# Patient Record
Sex: Female | Born: 1980 | Race: Black or African American | Hispanic: No
Health system: Southern US, Community
[De-identification: ages and names within clinical notes are randomized; demographics above are authoritative.]

## PROBLEM LIST (undated history)

## (undated) ENCOUNTER — Telehealth: Attending: Clinical | Primary: Clinical

## (undated) ENCOUNTER — Telehealth

## (undated) ENCOUNTER — Encounter

## (undated) ENCOUNTER — Ambulatory Visit: Payer: PRIVATE HEALTH INSURANCE | Attending: Family | Primary: Family

## (undated) ENCOUNTER — Encounter: Attending: Internal Medicine | Primary: Internal Medicine

## (undated) ENCOUNTER — Ambulatory Visit

## (undated) ENCOUNTER — Ambulatory Visit: Payer: Medicaid (Managed Care)

## (undated) ENCOUNTER — Encounter
Attending: Student in an Organized Health Care Education/Training Program | Primary: Student in an Organized Health Care Education/Training Program

## (undated) ENCOUNTER — Encounter: Attending: Clinical | Primary: Clinical

## (undated) ENCOUNTER — Ambulatory Visit: Payer: PRIVATE HEALTH INSURANCE | Attending: Adult Health | Primary: Adult Health

## (undated) ENCOUNTER — Telehealth: Attending: Family | Primary: Family

## (undated) ENCOUNTER — Non-Acute Institutional Stay
Payer: PRIVATE HEALTH INSURANCE | Attending: Rehabilitative and Restorative Service Providers" | Primary: Rehabilitative and Restorative Service Providers"

## (undated) ENCOUNTER — Ambulatory Visit: Payer: PRIVATE HEALTH INSURANCE | Attending: Nutritionist | Primary: Nutritionist

## (undated) ENCOUNTER — Encounter: Attending: Orthopaedic Surgery | Primary: Orthopaedic Surgery

## (undated) ENCOUNTER — Encounter: Attending: Physician Assistant | Primary: Physician Assistant

## (undated) ENCOUNTER — Encounter
Payer: PRIVATE HEALTH INSURANCE | Attending: Student in an Organized Health Care Education/Training Program | Primary: Student in an Organized Health Care Education/Training Program

## (undated) ENCOUNTER — Encounter: Attending: Family Medicine | Primary: Family Medicine

## (undated) ENCOUNTER — Telehealth: Attending: Family Medicine | Primary: Family Medicine

## (undated) ENCOUNTER — Ambulatory Visit: Payer: PRIVATE HEALTH INSURANCE | Attending: Surgery | Primary: Surgery

## (undated) ENCOUNTER — Telehealth: Attending: Registered" | Primary: Registered"

## (undated) ENCOUNTER — Ambulatory Visit: Payer: PRIVATE HEALTH INSURANCE | Attending: Family Medicine | Primary: Family Medicine

## (undated) ENCOUNTER — Encounter
Payer: PRIVATE HEALTH INSURANCE | Attending: Rehabilitative and Restorative Service Providers" | Primary: Rehabilitative and Restorative Service Providers"

## (undated) ENCOUNTER — Ambulatory Visit: Payer: PRIVATE HEALTH INSURANCE

## (undated) ENCOUNTER — Encounter: Attending: "Obstetric | Primary: "Obstetric

## (undated) ENCOUNTER — Telehealth
Attending: Student in an Organized Health Care Education/Training Program | Primary: Student in an Organized Health Care Education/Training Program

## (undated) ENCOUNTER — Encounter: Attending: Surgery | Primary: Surgery

## (undated) ENCOUNTER — Encounter
Attending: Rehabilitative and Restorative Service Providers" | Primary: Rehabilitative and Restorative Service Providers"

## (undated) ENCOUNTER — Encounter: Attending: Registered" | Primary: Registered"

## (undated) ENCOUNTER — Ambulatory Visit
Attending: Student in an Organized Health Care Education/Training Program | Primary: Student in an Organized Health Care Education/Training Program

## (undated) ENCOUNTER — Ambulatory Visit
Payer: PRIVATE HEALTH INSURANCE | Attending: Student in an Organized Health Care Education/Training Program | Primary: Student in an Organized Health Care Education/Training Program

## (undated) ENCOUNTER — Encounter: Attending: Family | Primary: Family

## (undated) ENCOUNTER — Ambulatory Visit
Payer: PRIVATE HEALTH INSURANCE | Attending: Rehabilitative and Restorative Service Providers" | Primary: Rehabilitative and Restorative Service Providers"

## (undated) ENCOUNTER — Ambulatory Visit
Payer: Medicaid (Managed Care) | Attending: Student in an Organized Health Care Education/Training Program | Primary: Student in an Organized Health Care Education/Training Program

## (undated) DIAGNOSIS — L732 Hidradenitis suppurativa: Secondary | ICD-10-CM

## (undated) HISTORY — PX: LAPAROSCOPIC GASTRIC SLEEVE RESECTION: SHX5895

---
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
## 2005-08-11 ENCOUNTER — Ambulatory Visit: Payer: Self-pay | Admitting: Family Medicine

## 2005-11-06 ENCOUNTER — Ambulatory Visit: Payer: Self-pay | Admitting: Family Medicine

## 2006-02-15 ENCOUNTER — Ambulatory Visit: Payer: Self-pay | Admitting: Family Medicine

## 2006-02-23 ENCOUNTER — Ambulatory Visit: Payer: Self-pay | Admitting: Family Medicine

## 2006-11-30 IMAGING — US US OB US >=[ID] SNGL FETUS
1 series · 17 of 28 positions shown · non-contrast
Comparison: none

REASON FOR EXAM: Fetal anatomy
COMMENTS:

[Series 1: us ob us >=(id) sngl fetus · 17 of 55 slices shown]
[im 1/55]
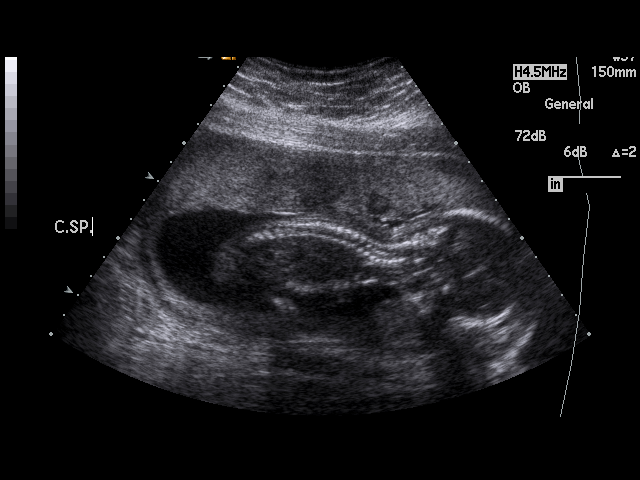
[im 5/55]
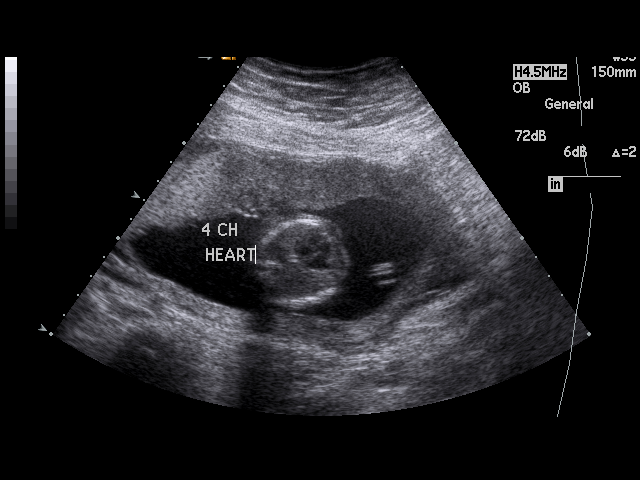
[im 9/55]
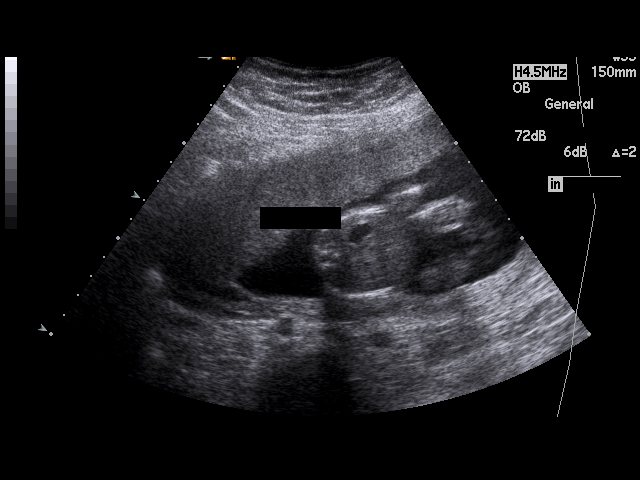
[im 11/55]
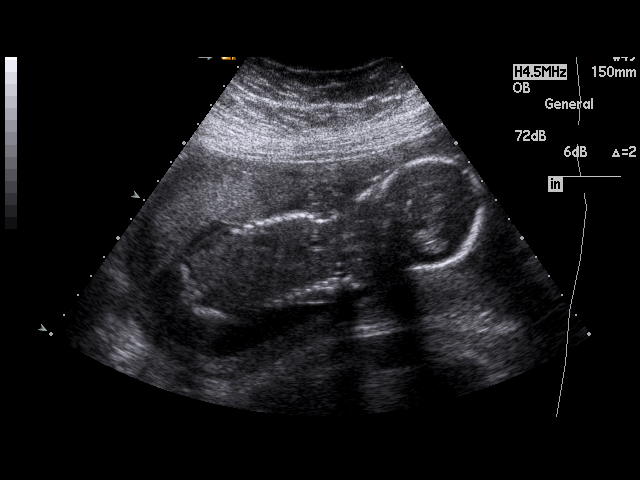
[im 15/55]
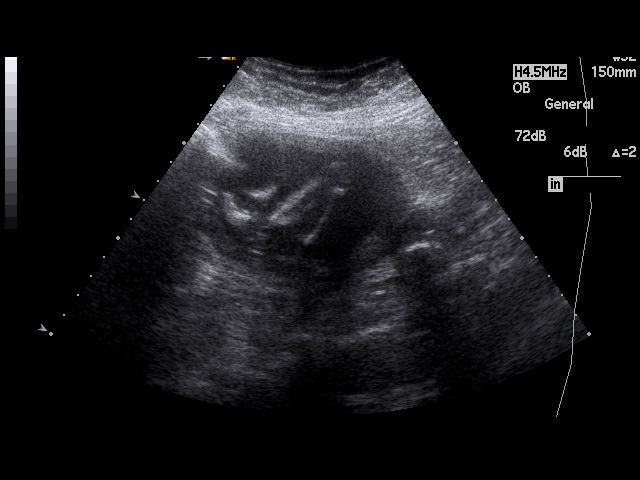
[im 19/55]
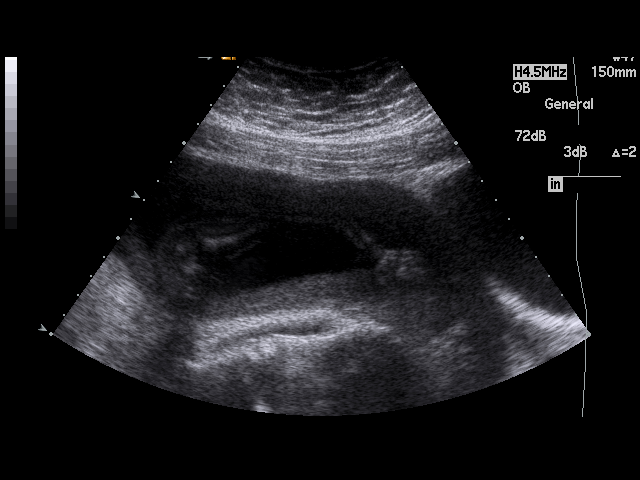
[im 21/55]
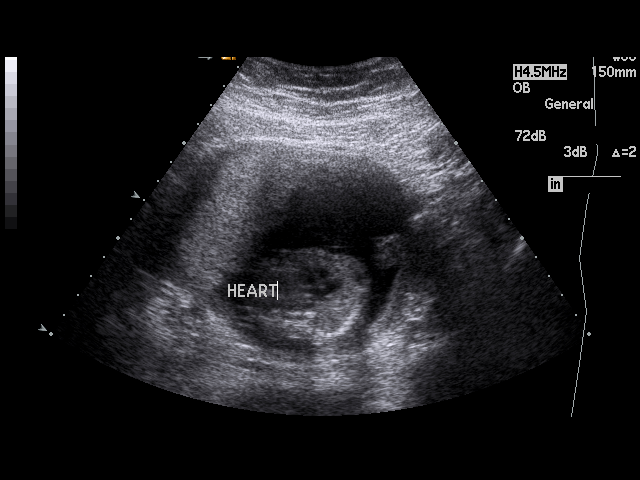
[im 25/55]
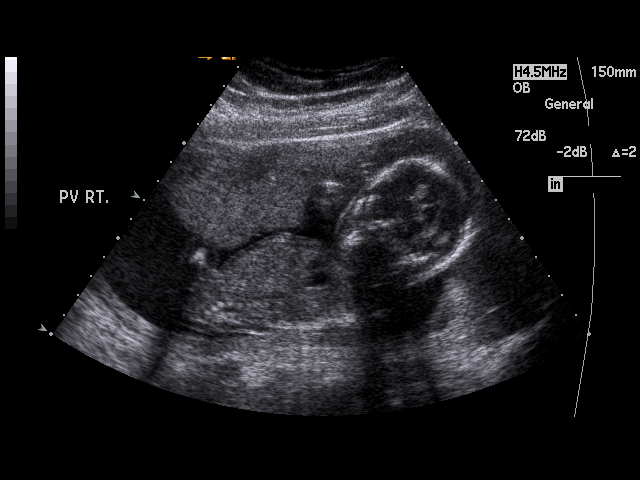
[im 29/55]
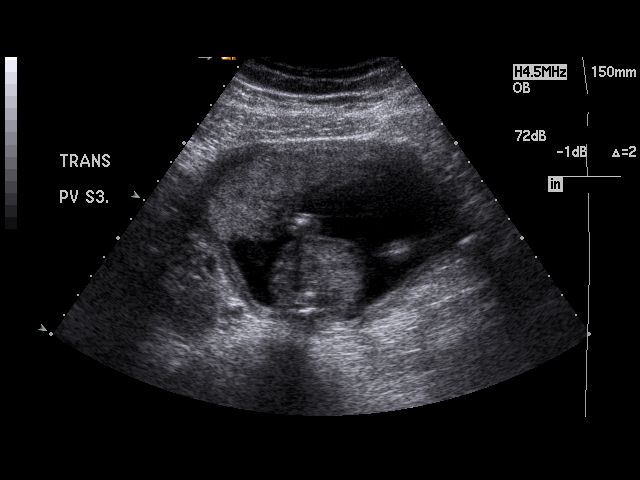
[im 31/55]
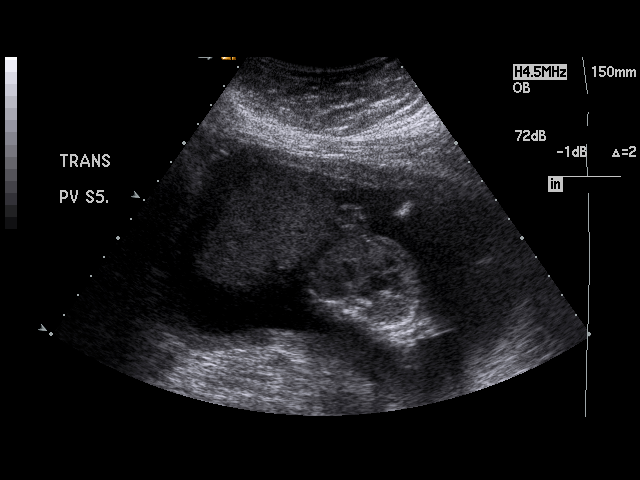
[im 35/55]
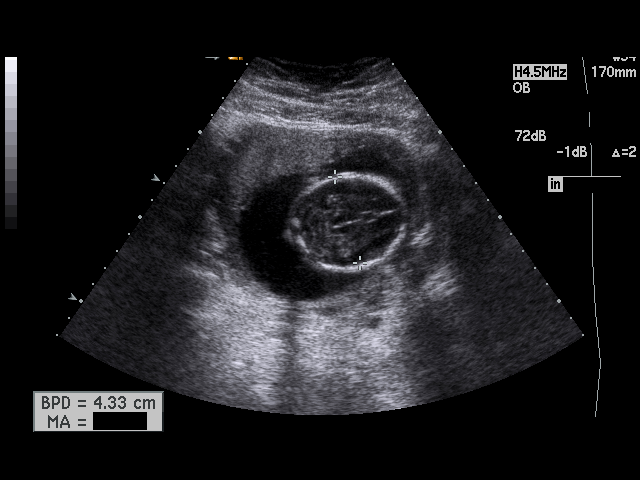
[im 37/55]
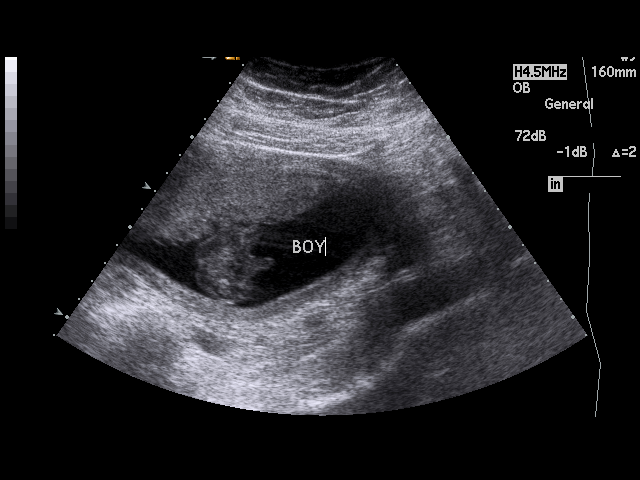
[im 41/55]
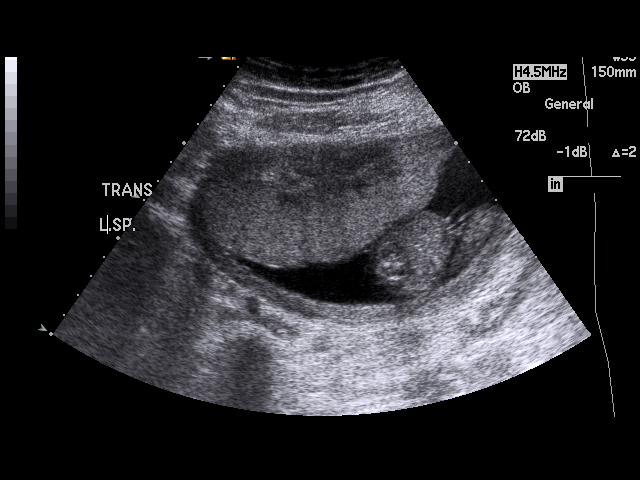
[im 45/55]
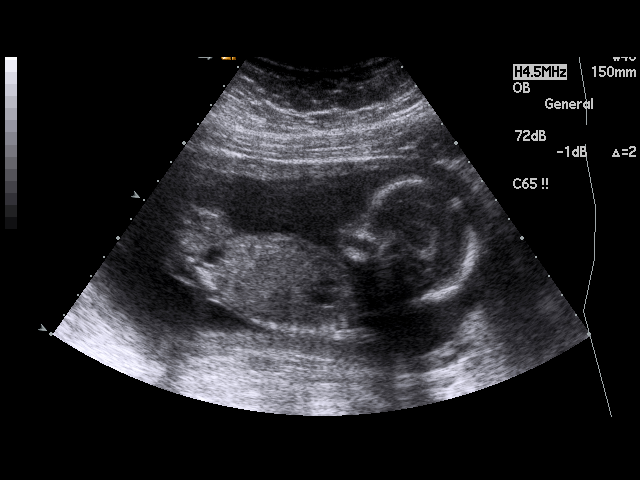
[im 47/55]
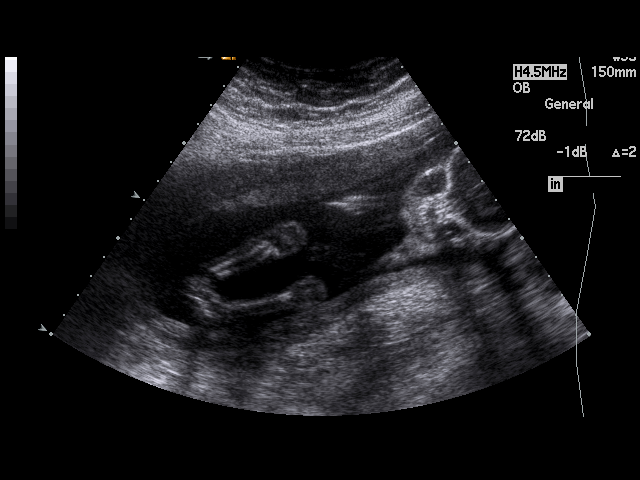
[im 51/55]
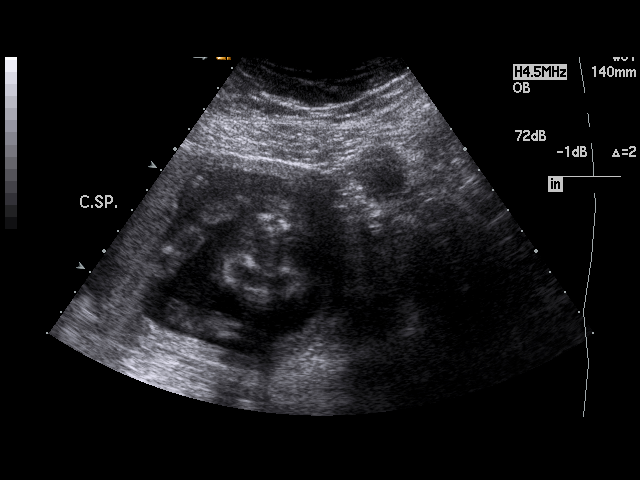
[im 55/55]
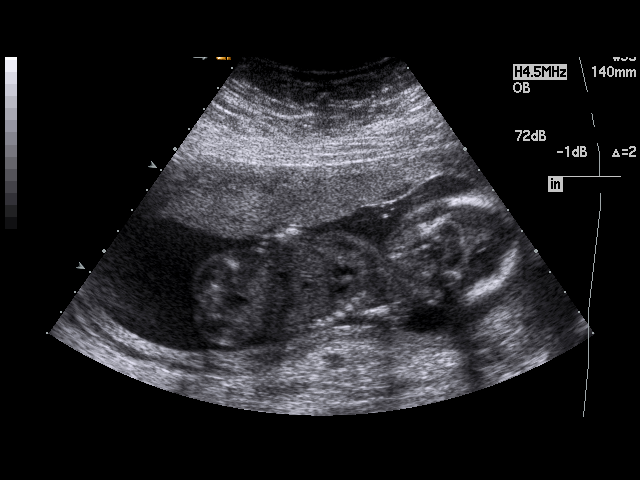

[17 of 28 positions shown; findings below may reference images not displayed]

PROCEDURE:     US  - US OB GREATER/OR EQUAL TO SMOHF  - November 06, 2005 [DATE]

RESULT:     There is noted a living intrauterine gestation.  The placenta is
anterior.  Presentation is variable during the course of this exam.
Amniotic fluid volume appears normal.  The fetal heart, stomach, and urinary
bladder are visualized.  No hydrocephalus or hydronephrosis is seen.  Heart
rate was monitored at 150 beats per minute.

Fetal measurements are as follows:
BPD 4.36 cm (19 weeks 1 day)
HC 16.57 cm (19 weeks 2 days)
AC 13.66 cm (19 weeks 1 day)
FL 3.03 cm (19 weeks 2 days)
EFW equal 305 grams.
Average ultrasound age 19 weeks 1 day.
Ultrasound EDD 04/01/06.
IMPRESSION: Please see above.

## 2007-03-11 IMAGING — US US OB US >=[ID] SNGL FETUS
1 series · 14 of 28 positions shown · non-contrast
Comparison: none

REASON FOR EXAM: hypertension     size greater than dates
COMMENTS:

[Series 1: us ob us >=(id) sngl fetus · 0.33mm/px · 14 of 32 slices shown]
[im 2/32]
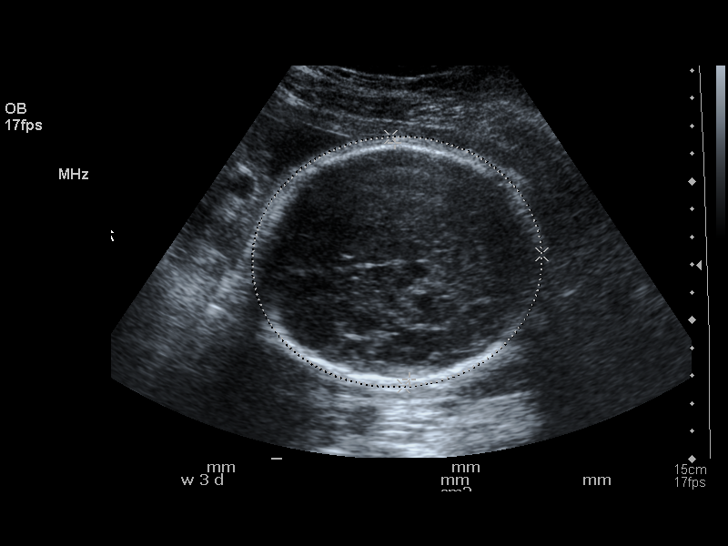
[im 4/32]
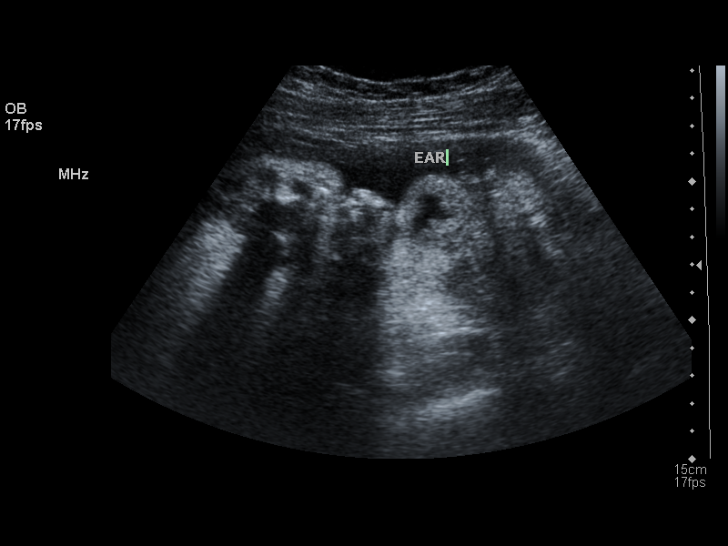
[im 6/32]
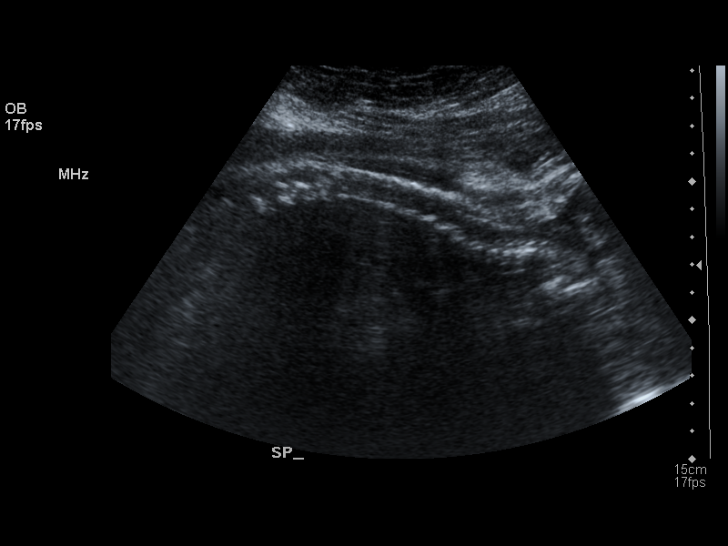
[im 9/32]
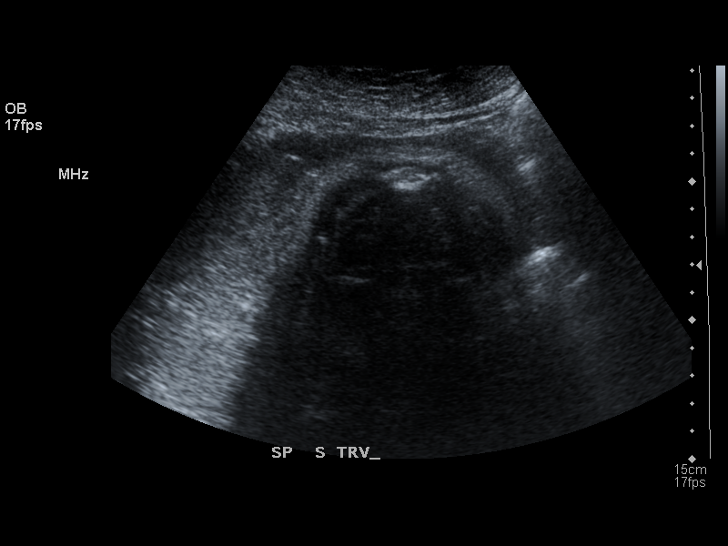
[im 11/32]
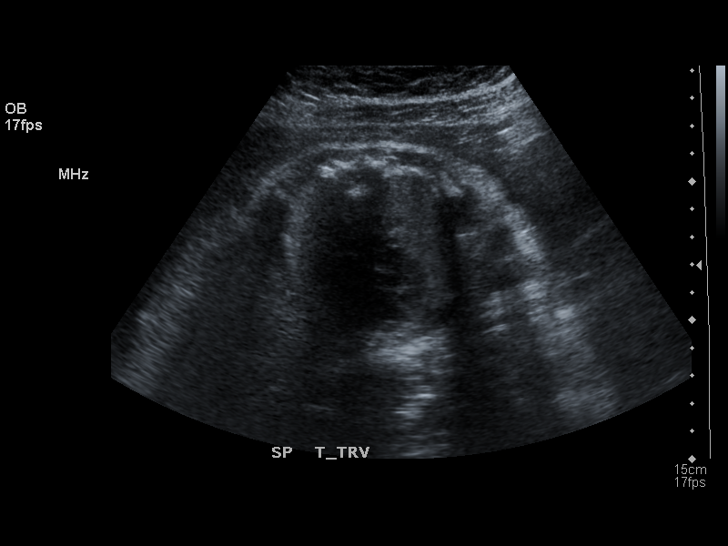
[im 13/32]
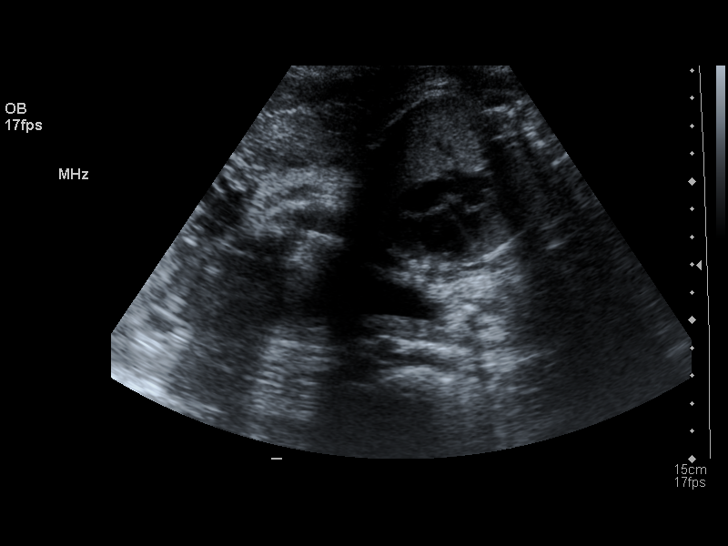
[im 15/32]
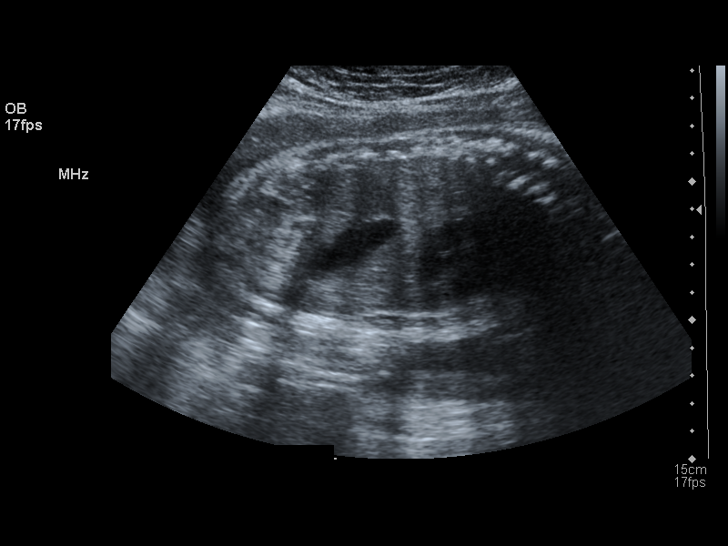
[im 18/32]
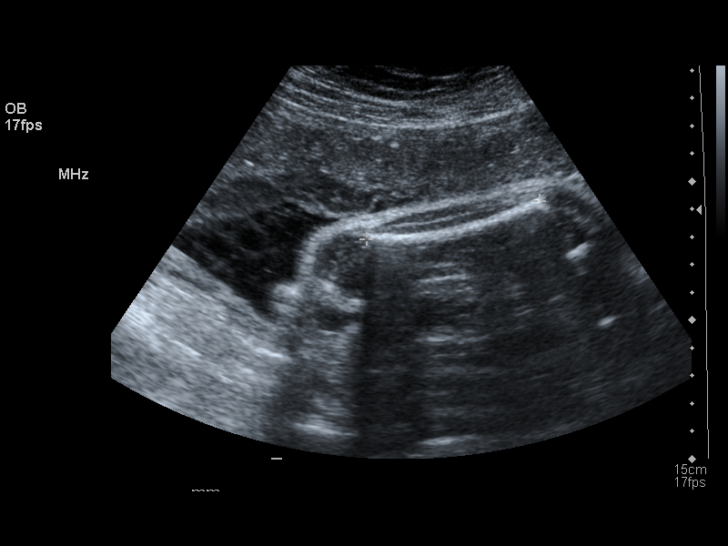
[im 20/32]
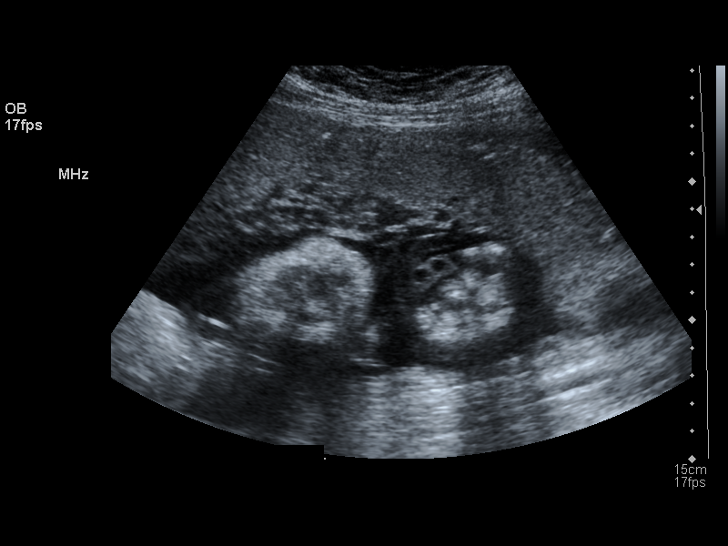
[im 22/32]
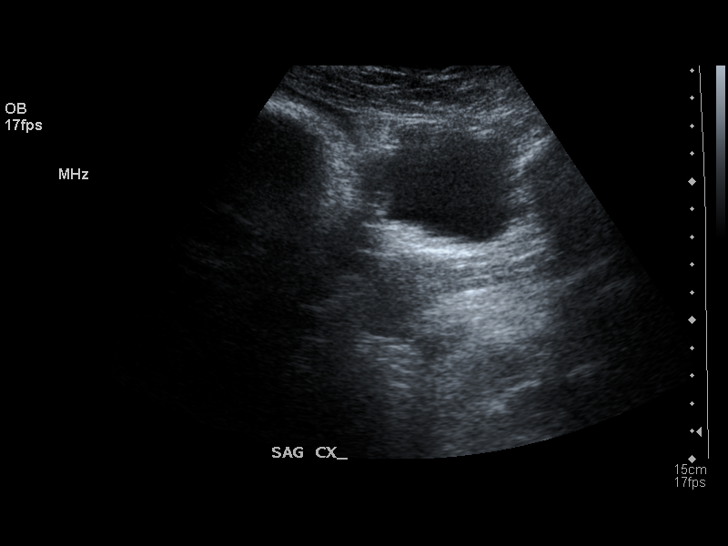
[im 25/32]
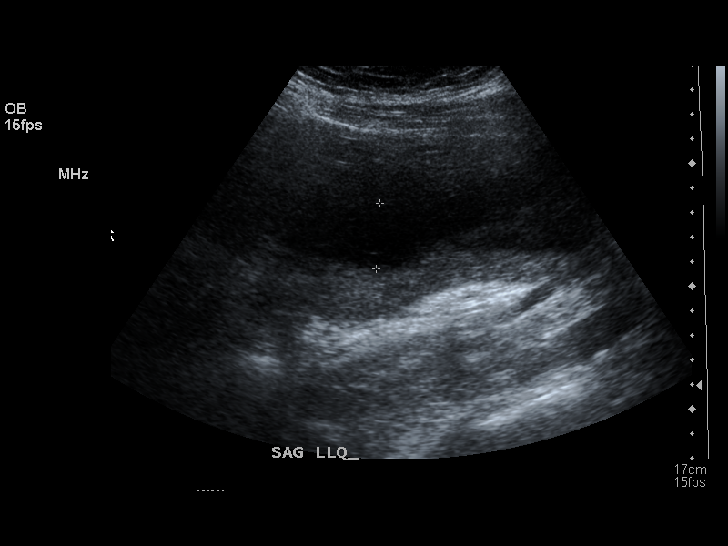
[im 27/32]
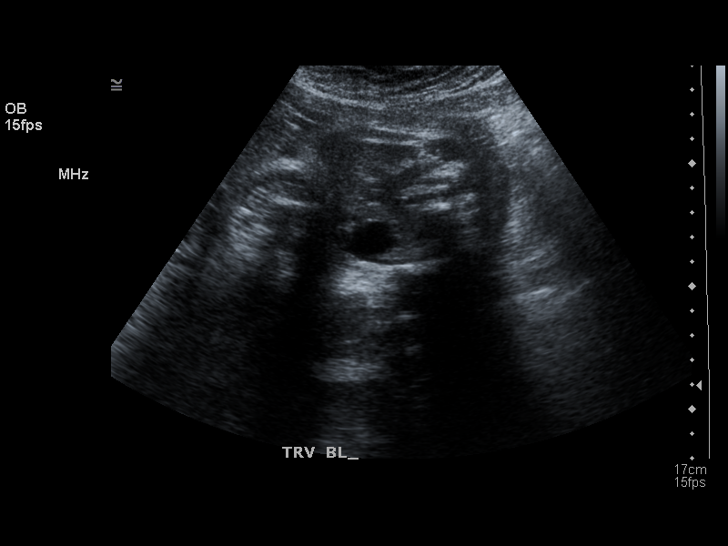
[im 29/32]
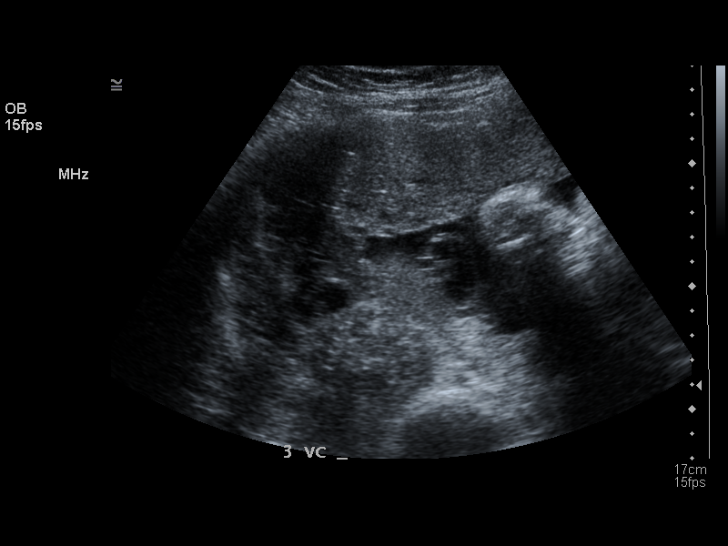
[im 32/32]
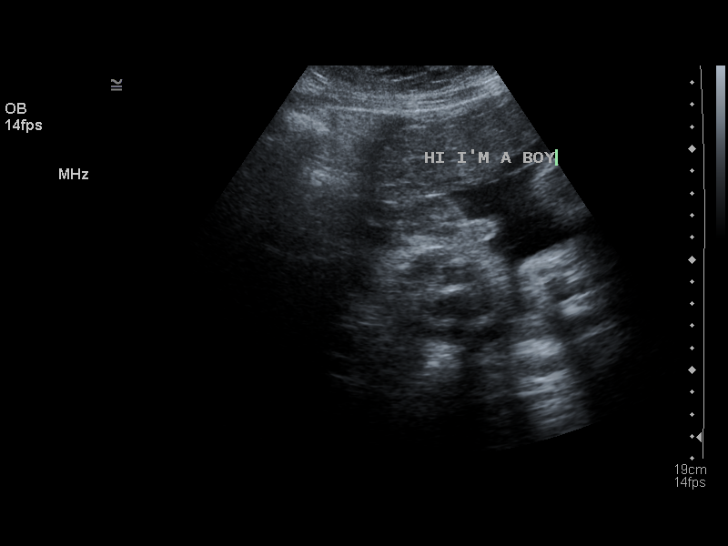

[14 of 28 positions shown; findings below may reference images not displayed]

PROCEDURE:     US  - US OB GREATER/OR EQUAL TO [id number]  - February 15, 2006 [DATE]

RESULT:     There is a living intrauterine gestation.  Fetal heart rate was
monitored at 136 beats per minute. Presentation currently is cephalic.
Amniotic fluid volume appears normal. The placenta is anterior.  Fetal
heart, stomach and urinary bladder are visualized. No hydrocephalus or
hydronephrosis is seen.  Fetal measurements are as follows:

BPD: 85.5 mm (34 weeks-6 days)HC: 305.9 mm (34 weeks-0 days)
AC: 289.9 mm (33.0 weeks)
FL: 63.9 mm (33.0 weeks)

EFW = 2,151 grams.  Average ultrasound age based on today's measurements is
33 weeks-7 days.  Ultrasound EDD is 04-01-06.  There has been appropriate
interval growth since the prior exam of 11-06-05.
IMPRESSION: 1)Please see above.

## 2017-06-12 MED FILL — LABETALOL HCL/200MG/TABS: LABETALOL HCL/200MG/TABS | 30 days supply | Qty: 60 | Fill #4

## 2017-06-12 MED FILL — PREPLUS/27-1MG/TABS: PREPLUS/27-1MG/TABS | 30 days supply | Qty: 30 | Fill #5

## 2017-07-23 ENCOUNTER — Ambulatory Visit: Admission: RE | Admit: 2017-07-23 | Discharge: 2017-07-23 | Payer: MEDICAID | Attending: Family Medicine

## 2017-07-23 DIAGNOSIS — I10 Essential (primary) hypertension: Principal | ICD-10-CM

## 2017-07-23 DIAGNOSIS — IMO0001 Class 3 obesity with body mass index (BMI) of 45.0 to 49.9 in adult, unspecified obesity type, unspecified whether serious comorbidity present: Secondary | ICD-10-CM

## 2017-07-23 MED ORDER — HYDROCHLOROTHIAZIDE 12.5 MG TABLET
ORAL_TABLET | Freq: Every day | ORAL | 11 refills | 0.00000 days | Status: CP
Start: 2017-07-23 — End: 2017-07-23

## 2017-07-23 MED ORDER — HYDROCHLOROTHIAZIDE 12.5 MG TABLET: tablet | 11 refills | 0 days

## 2017-07-23 MED FILL — HYDROCHLOROTHIAZIDE/12.5MG/TABS: HYDROCHLOROTHIAZIDE/12.5MG/TABS | 30 days supply | Qty: 30 | Fill #0

## 2017-07-26 MED FILL — LABETALOL HCL/200MG/TABS: LABETALOL HCL/200MG/TABS | 30 days supply | Qty: 60 | Fill #5

## 2017-07-26 MED FILL — PREPLUS/27-1MG/TABS: PREPLUS/27-1MG/TABS | 30 days supply | Qty: 30 | Fill #6

## 2017-08-13 ENCOUNTER — Ambulatory Visit: Admission: RE | Admit: 2017-08-13 | Discharge: 2017-08-13 | Payer: MEDICAID

## 2017-08-13 DIAGNOSIS — I10 Essential (primary) hypertension: Principal | ICD-10-CM

## 2017-08-13 DIAGNOSIS — IMO0001 Class 3 obesity with body mass index (BMI) of 45.0 to 49.9 in adult, unspecified obesity type, unspecified whether serious comorbidity present: Secondary | ICD-10-CM

## 2017-08-21 ENCOUNTER — Ambulatory Visit
Admission: RE | Admit: 2017-08-21 | Discharge: 2017-08-21 | Payer: MEDICAID | Attending: Family Medicine | Admitting: Family Medicine

## 2017-08-21 DIAGNOSIS — Z975 Presence of (intrauterine) contraceptive device: Secondary | ICD-10-CM

## 2017-08-21 DIAGNOSIS — G4733 Obstructive sleep apnea (adult) (pediatric): Secondary | ICD-10-CM

## 2017-08-21 DIAGNOSIS — I10 Essential (primary) hypertension: Principal | ICD-10-CM

## 2017-08-21 DIAGNOSIS — Z8659 Personal history of other mental and behavioral disorders: Secondary | ICD-10-CM

## 2017-08-21 DIAGNOSIS — R7309 Other abnormal glucose: Secondary | ICD-10-CM

## 2017-08-21 MED ORDER — ETONOGESTREL 68 MG SUBDERMAL IMPLANT
Freq: Once | SUBCUTANEOUS | 0 refills | 0 days
Start: 2017-08-21 — End: 2017-08-21

## 2017-09-04 ENCOUNTER — Ambulatory Visit
Admission: RE | Admit: 2017-09-04 | Discharge: 2017-09-04 | Disposition: A | Discharge status: Home / Self Care | Payer: MEDICAID | Attending: Family Medicine | Admitting: Family Medicine

## 2017-09-04 DIAGNOSIS — R05 Cough: Secondary | ICD-10-CM

## 2017-09-04 DIAGNOSIS — I10 Essential (primary) hypertension: Principal | ICD-10-CM

## 2017-09-04 DIAGNOSIS — F330 Major depressive disorder, recurrent, mild: Secondary | ICD-10-CM

## 2017-09-04 DIAGNOSIS — Z8659 Personal history of other mental and behavioral disorders: Secondary | ICD-10-CM

## 2017-09-04 DIAGNOSIS — G4733 Obstructive sleep apnea (adult) (pediatric): Secondary | ICD-10-CM

## 2017-09-04 DIAGNOSIS — Z23 Encounter for immunization: Secondary | ICD-10-CM

## 2017-09-04 DIAGNOSIS — Z6841 Body Mass Index (BMI) 40.0 and over, adult: Secondary | ICD-10-CM

## 2017-09-04 MED ORDER — CITALOPRAM 10 MG TABLET
ORAL_TABLET | Freq: Every day | ORAL | 1 refills | 0.00000 days | Status: CP
Start: 2017-09-04 — End: 2017-09-17

## 2017-09-14 MED FILL — HYDROCHLOROTHIAZIDE/12.5MG/TABS: HYDROCHLOROTHIAZIDE/12.5MG/TABS | 30 days supply | Qty: 30 | Fill #1

## 2017-09-17 MED ORDER — CITALOPRAM 10 MG TABLET
ORAL_TABLET | Freq: Every day | ORAL | 1 refills | 0.00000 days | Status: CP
Start: 2017-09-17 — End: 2017-09-17

## 2017-09-17 MED ORDER — CITALOPRAM 10 MG TABLET: 10 mg | tablet | Freq: Every day | 1 refills | 0 days | Status: AC

## 2017-10-08 ENCOUNTER — Ambulatory Visit
Admission: RE | Admit: 2017-10-08 | Discharge: 2017-10-08 | Payer: MEDICAID | Attending: Family Medicine | Admitting: Family Medicine

## 2017-10-08 DIAGNOSIS — G4733 Obstructive sleep apnea (adult) (pediatric): Principal | ICD-10-CM

## 2017-10-08 DIAGNOSIS — Z6841 Body Mass Index (BMI) 40.0 and over, adult: Secondary | ICD-10-CM

## 2017-10-08 DIAGNOSIS — F330 Major depressive disorder, recurrent, mild: Secondary | ICD-10-CM

## 2017-10-08 DIAGNOSIS — Z8659 Personal history of other mental and behavioral disorders: Secondary | ICD-10-CM

## 2017-10-08 DIAGNOSIS — R61 Generalized hyperhidrosis: Secondary | ICD-10-CM

## 2017-10-08 DIAGNOSIS — I10 Essential (primary) hypertension: Secondary | ICD-10-CM

## 2017-10-08 MED ORDER — HYDROCHLOROTHIAZIDE 25 MG TABLET: 25 mg | tablet | Freq: Every day | 11 refills | 0 days | Status: AC

## 2017-10-08 MED ORDER — HYDROCHLOROTHIAZIDE 25 MG TABLET
ORAL_TABLET | Freq: Every day | ORAL | 11 refills | 0.00000 days | Status: CP
Start: 2017-10-08 — End: 2017-10-08

## 2017-10-16 ENCOUNTER — Ambulatory Visit
Admission: RE | Admit: 2017-10-16 | Discharge: 2017-10-17 | Disposition: A | Discharge status: Home / Self Care | Payer: MEDICAID

## 2017-10-16 DIAGNOSIS — G4733 Obstructive sleep apnea (adult) (pediatric): Principal | ICD-10-CM

## 2017-10-25 ENCOUNTER — Emergency Department
Admission: EM | Admit: 2017-10-25 | Discharge: 2017-10-25 | Disposition: A | Discharge status: Home / Self Care | Payer: MEDICAID | Source: Intra-hospital

## 2017-10-25 DIAGNOSIS — R05 Cough: Secondary | ICD-10-CM

## 2017-10-25 DIAGNOSIS — R109 Unspecified abdominal pain: Principal | ICD-10-CM

## 2017-10-25 DIAGNOSIS — R51 Headache: Secondary | ICD-10-CM

## 2017-12-31 MED ORDER — CLINDAMYCIN 1 % LOTION
Freq: Every morning | TOPICAL | 5 refills | 0.00000 days | Status: CP
Start: 2017-12-31 — End: 2017-12-31

## 2017-12-31 MED ORDER — CLINDAMYCIN 1 % LOTION: mL | Freq: Every morning | 5 refills | 0 days | Status: SS

## 2018-01-02 MED ORDER — CLINDAMYCIN PHOSPHATE 1 % TOPICAL SOLUTION
Freq: Two times a day (BID) | TOPICAL | 6 refills | 0 days | Status: CP
Start: 2018-01-02 — End: 2018-09-19

## 2018-01-22 MED FILL — HYDROCHLOROTHIAZIDE/25MG/TAB: HYDROCHLOROTHIAZIDE/25MG/TAB | 30 days supply | Qty: 30 | Fill #1

## 2018-04-10 MED FILL — HYDROCHLOROTHIAZIDE/25MG/TAB: HYDROCHLOROTHIAZIDE/25MG/TAB | 30 days supply | Qty: 30 | Fill #2

## 2018-06-14 MED FILL — HYDROCHLOROTHIAZIDE/25MG/TAB: HYDROCHLOROTHIAZIDE/25MG/TAB | 30 days supply | Qty: 30 | Fill #3

## 2018-09-13 ENCOUNTER — Ambulatory Visit
Admit: 2018-09-13 | Discharge: 2018-09-15 | Disposition: A | Discharge status: Home / Self Care | Payer: PRIVATE HEALTH INSURANCE

## 2018-09-15 MED ORDER — DOXYCYCLINE HYCLATE 100 MG CAPSULE
ORAL_CAPSULE | Freq: Two times a day (BID) | ORAL | 0 refills | 0.00000 days | Status: CP
Start: 2018-09-15 — End: 2018-09-19
  Filled 2018-09-15: qty 8, 4d supply, fill #0

## 2018-09-15 MED ORDER — ACETAMINOPHEN 325 MG TABLET
ORAL_TABLET | Freq: Four times a day (QID) | ORAL | 0 refills | 0.00000 days | Status: CP | PRN
Start: 2018-09-15 — End: ?
  Filled 2018-09-15: qty 60, 8d supply, fill #0

## 2018-09-15 MED ORDER — IBUPROFEN 600 MG TABLET
ORAL_TABLET | Freq: Four times a day (QID) | ORAL | 0 refills | 0.00000 days | Status: CP | PRN
Start: 2018-09-15 — End: ?
  Filled 2018-09-15: qty 30, 7d supply, fill #0

## 2018-09-15 MED FILL — DOXYCYCLINE HYCLATE 100 MG CAPSULE: 4 days supply | Qty: 8 | Fill #0 | Status: AC

## 2018-09-15 MED FILL — IBUPROFEN 600 MG TABLET: 7 days supply | Qty: 30 | Fill #0 | Status: AC

## 2018-09-15 MED FILL — ACETAMINOPHEN 325 MG TABLET: 8 days supply | Qty: 60 | Fill #0 | Status: AC

## 2018-09-19 ENCOUNTER — Encounter
Admit: 2018-09-19 | Discharge: 2018-09-20 | Payer: PRIVATE HEALTH INSURANCE | Attending: Sports Medicine | Primary: Sports Medicine

## 2018-09-19 ENCOUNTER — Encounter: Admit: 2018-09-19 | Discharge: 2018-09-20 | Payer: PRIVATE HEALTH INSURANCE

## 2018-09-19 DIAGNOSIS — L732 Hidradenitis suppurativa: Secondary | ICD-10-CM

## 2018-09-19 DIAGNOSIS — L03119 Cellulitis of unspecified part of limb: Principal | ICD-10-CM

## 2018-09-19 DIAGNOSIS — L02419 Cutaneous abscess of limb, unspecified: Secondary | ICD-10-CM

## 2018-09-19 MED ORDER — DOXYCYCLINE HYCLATE 100 MG CAPSULE
ORAL_CAPSULE | Freq: Two times a day (BID) | ORAL | 0 refills | 0 days | Status: CP
Start: 2018-09-19 — End: 2018-09-30
  Filled 2018-09-19: qty 42, 21d supply, fill #0

## 2018-09-19 MED FILL — DOXYCYCLINE HYCLATE 100 MG CAPSULE: 21 days supply | Qty: 42 | Fill #0 | Status: AC

## 2018-09-30 ENCOUNTER — Encounter
Admit: 2018-09-30 | Discharge: 2018-10-01 | Payer: PRIVATE HEALTH INSURANCE | Attending: Family Medicine | Primary: Family Medicine

## 2018-09-30 DIAGNOSIS — L732 Hidradenitis suppurativa: Secondary | ICD-10-CM

## 2018-09-30 DIAGNOSIS — F330 Major depressive disorder, recurrent, mild: Secondary | ICD-10-CM

## 2018-09-30 DIAGNOSIS — I10 Essential (primary) hypertension: Secondary | ICD-10-CM

## 2018-09-30 DIAGNOSIS — D72819 Decreased white blood cell count, unspecified: Secondary | ICD-10-CM

## 2018-09-30 DIAGNOSIS — G4733 Obstructive sleep apnea (adult) (pediatric): Secondary | ICD-10-CM

## 2018-09-30 DIAGNOSIS — R7309 Other abnormal glucose: Secondary | ICD-10-CM

## 2018-09-30 DIAGNOSIS — R253 Fasciculation: Secondary | ICD-10-CM

## 2018-09-30 DIAGNOSIS — L03119 Cellulitis of unspecified part of limb: Principal | ICD-10-CM

## 2018-09-30 DIAGNOSIS — L02419 Cutaneous abscess of limb, unspecified: Secondary | ICD-10-CM

## 2018-09-30 MED ORDER — CEPHALEXIN 500 MG CAPSULE
ORAL_CAPSULE | Freq: Four times a day (QID) | ORAL | 0 refills | 0 days | Status: CP
Start: 2018-09-30 — End: 2018-10-16
  Filled 2018-09-30: qty 28, 7d supply, fill #0

## 2018-09-30 MED FILL — HYDROCHLOROTHIAZIDE 25 MG TABLET: 30 days supply | Qty: 30 | Fill #0

## 2018-09-30 MED FILL — HYDROCHLOROTHIAZIDE 25 MG TABLET: 30 days supply | Qty: 30 | Fill #0 | Status: AC

## 2018-09-30 MED FILL — CEPHALEXIN 500 MG CAPSULE: 7 days supply | Qty: 28 | Fill #0 | Status: AC

## 2018-10-02 MED ORDER — MAGNESIUM OXIDE 400 MG (241.3 MG MAGNESIUM) TABLET
ORAL_TABLET | Freq: Every day | ORAL | 11 refills | 0 days | Status: CP
Start: 2018-10-02 — End: 2019-01-08

## 2018-10-10 MED ORDER — DOXYCYCLINE HYCLATE 100 MG CAPSULE
ORAL_CAPSULE | Freq: Two times a day (BID) | ORAL | 0 refills | 0.00000 days | Status: CP
Start: 2018-10-10 — End: 2018-11-13

## 2018-10-16 ENCOUNTER — Encounter
Admit: 2018-10-16 | Discharge: 2018-10-17 | Payer: PRIVATE HEALTH INSURANCE | Attending: Family Medicine | Primary: Family Medicine

## 2018-10-16 DIAGNOSIS — G4733 Obstructive sleep apnea (adult) (pediatric): Secondary | ICD-10-CM

## 2018-10-16 DIAGNOSIS — L732 Hidradenitis suppurativa: Secondary | ICD-10-CM

## 2018-10-16 DIAGNOSIS — L03119 Cellulitis of unspecified part of limb: Secondary | ICD-10-CM

## 2018-10-16 DIAGNOSIS — L02419 Cutaneous abscess of limb, unspecified: Secondary | ICD-10-CM

## 2018-10-16 DIAGNOSIS — I10 Essential (primary) hypertension: Principal | ICD-10-CM

## 2018-10-16 MED ORDER — RIFAMPIN 300 MG CAPSULE
ORAL_CAPSULE | Freq: Two times a day (BID) | ORAL | 3 refills | 0 days | Status: CP
Start: 2018-10-16 — End: 2018-10-30
  Filled 2018-10-16: qty 60, 30d supply, fill #0

## 2018-10-16 MED ORDER — CLINDAMYCIN HCL 300 MG CAPSULE
ORAL_CAPSULE | Freq: Two times a day (BID) | ORAL | 3 refills | 0 days | Status: CP
Start: 2018-10-16 — End: 2019-01-08
  Filled 2018-10-16: qty 60, 30d supply, fill #0

## 2018-10-16 MED FILL — RIFAMPIN 300 MG CAPSULE: 30 days supply | Qty: 60 | Fill #0 | Status: AC

## 2018-10-16 MED FILL — CLINDAMYCIN HCL 300 MG CAPSULE: 30 days supply | Qty: 60 | Fill #0 | Status: AC

## 2018-11-13 ENCOUNTER — Encounter
Admit: 2018-11-13 | Discharge: 2018-11-14 | Payer: PRIVATE HEALTH INSURANCE | Attending: Family Medicine | Primary: Family Medicine

## 2018-11-13 DIAGNOSIS — L02419 Cutaneous abscess of limb, unspecified: Secondary | ICD-10-CM

## 2018-11-13 DIAGNOSIS — L732 Hidradenitis suppurativa: Secondary | ICD-10-CM

## 2018-11-13 DIAGNOSIS — T887XXA Unspecified adverse effect of drug or medicament, initial encounter: Secondary | ICD-10-CM

## 2018-11-13 DIAGNOSIS — I10 Essential (primary) hypertension: Principal | ICD-10-CM

## 2018-11-13 DIAGNOSIS — L03119 Cellulitis of unspecified part of limb: Secondary | ICD-10-CM

## 2018-11-13 MED ORDER — CLINDAMYCIN PHOSPHATE 1 % TOPICAL SOLUTION
Freq: Two times a day (BID) | TOPICAL | 1 refills | 0.00000 days | Status: CP
Start: 2018-11-13 — End: 2019-03-13
  Filled 2018-11-14: qty 60, 30d supply, fill #0

## 2018-11-13 MED ORDER — DOXYCYCLINE HYCLATE 100 MG CAPSULE
ORAL_CAPSULE | Freq: Two times a day (BID) | ORAL | 0 refills | 0 days | Status: CP
Start: 2018-11-13 — End: 2018-11-24
  Filled 2018-11-14: qty 20, 10d supply, fill #0

## 2018-11-13 MED ORDER — HYDROCHLOROTHIAZIDE 25 MG TABLET
ORAL_TABLET | Freq: Every day | ORAL | 11 refills | 30 days | Status: CP
Start: 2018-11-13 — End: 2019-11-13
  Filled 2018-11-14: qty 30, 30d supply, fill #0

## 2018-11-14 MED FILL — CLINDAMYCIN PHOSPHATE 1 % TOPICAL SOLUTION: 30 days supply | Qty: 60 | Fill #0 | Status: AC

## 2018-11-14 MED FILL — HYDROCHLOROTHIAZIDE 25 MG TABLET: 30 days supply | Qty: 30 | Fill #0 | Status: AC

## 2018-11-14 MED FILL — DOXYCYCLINE HYCLATE 100 MG CAPSULE: 10 days supply | Qty: 20 | Fill #0 | Status: AC

## 2018-12-24 ENCOUNTER — Encounter
Admit: 2018-12-24 | Discharge: 2018-12-25 | Payer: PRIVATE HEALTH INSURANCE | Attending: Nutritionist | Primary: Nutritionist

## 2018-12-24 DIAGNOSIS — L732 Hidradenitis suppurativa: Principal | ICD-10-CM

## 2018-12-24 DIAGNOSIS — Z6841 Body Mass Index (BMI) 40.0 and over, adult: Secondary | ICD-10-CM

## 2019-01-06 MED FILL — HYDROCHLOROTHIAZIDE 25 MG TABLET: 30 days supply | Qty: 30 | Fill #1 | Status: AC

## 2019-01-06 MED FILL — HYDROCHLOROTHIAZIDE 25 MG TABLET: ORAL | 30 days supply | Qty: 30 | Fill #1

## 2019-01-08 ENCOUNTER — Encounter
Admit: 2019-01-08 | Discharge: 2019-01-09 | Payer: PRIVATE HEALTH INSURANCE | Attending: Family Medicine | Primary: Family Medicine

## 2019-01-08 DIAGNOSIS — G44209 Tension-type headache, unspecified, not intractable: Secondary | ICD-10-CM

## 2019-01-08 DIAGNOSIS — G4733 Obstructive sleep apnea (adult) (pediatric): Secondary | ICD-10-CM

## 2019-01-08 DIAGNOSIS — L732 Hidradenitis suppurativa: Principal | ICD-10-CM

## 2019-01-08 MED ORDER — CLINDAMYCIN HCL 300 MG CAPSULE
ORAL_CAPSULE | Freq: Two times a day (BID) | ORAL | 3 refills | 0.00000 days | Status: CP
Start: 2019-01-08 — End: 2019-03-12

## 2019-02-03 ENCOUNTER — Encounter: Admit: 2019-02-03 | Discharge: 2019-02-04 | Payer: PRIVATE HEALTH INSURANCE

## 2019-02-03 DIAGNOSIS — G43909 Migraine, unspecified, not intractable, without status migrainosus: Principal | ICD-10-CM

## 2019-02-03 DIAGNOSIS — Z98891 History of uterine scar from previous surgery: Principal | ICD-10-CM

## 2019-02-03 DIAGNOSIS — O99212 Obesity complicating pregnancy, second trimester: Principal | ICD-10-CM

## 2019-02-03 DIAGNOSIS — E669 Obesity, unspecified: Principal | ICD-10-CM

## 2019-02-03 DIAGNOSIS — L03119 Cellulitis of unspecified part of limb: Principal | ICD-10-CM

## 2019-02-03 DIAGNOSIS — R011 Cardiac murmur, unspecified: Principal | ICD-10-CM

## 2019-02-03 DIAGNOSIS — O99345 Other mental disorders complicating the puerperium: Principal | ICD-10-CM

## 2019-02-03 DIAGNOSIS — L0292 Furuncle, unspecified: Principal | ICD-10-CM

## 2019-02-03 DIAGNOSIS — Z315 Encounter for genetic counseling: Principal | ICD-10-CM

## 2019-02-03 DIAGNOSIS — L732 Hidradenitis suppurativa: Principal | ICD-10-CM

## 2019-02-03 DIAGNOSIS — I10 Essential (primary) hypertension: Principal | ICD-10-CM

## 2019-02-03 DIAGNOSIS — O0993 Supervision of high risk pregnancy, unspecified, third trimester: Principal | ICD-10-CM

## 2019-02-03 DIAGNOSIS — M79662 Pain in left lower leg: Principal | ICD-10-CM

## 2019-02-03 DIAGNOSIS — R61 Generalized hyperhidrosis: Principal | ICD-10-CM

## 2019-02-03 DIAGNOSIS — M549 Dorsalgia, unspecified: Principal | ICD-10-CM

## 2019-02-03 DIAGNOSIS — L02419 Cutaneous abscess of limb, unspecified: Principal | ICD-10-CM

## 2019-02-03 DIAGNOSIS — F329 Major depressive disorder, single episode, unspecified: Principal | ICD-10-CM

## 2019-02-03 DIAGNOSIS — F53 Postpartum depression: Principal | ICD-10-CM

## 2019-02-03 DIAGNOSIS — L989 Disorder of the skin and subcutaneous tissue, unspecified: Principal | ICD-10-CM

## 2019-02-03 DIAGNOSIS — R0683 Snoring: Principal | ICD-10-CM

## 2019-02-03 DIAGNOSIS — O09521 Supervision of elderly multigravida, first trimester: Principal | ICD-10-CM

## 2019-02-03 DIAGNOSIS — O365930 Maternal care for other known or suspected poor fetal growth, third trimester, not applicable or unspecified: Principal | ICD-10-CM

## 2019-02-03 MED ORDER — METFORMIN 500 MG TABLET
ORAL_TABLET | Freq: Two times a day (BID) | ORAL | 3 refills | 0 days | Status: CP
Start: 2019-02-03 — End: 2019-03-13
  Filled 2019-02-03: qty 60, 30d supply, fill #0

## 2019-02-03 MED ORDER — SPIRONOLACTONE 100 MG TABLET
ORAL_TABLET | Freq: Every day | ORAL | 3 refills | 0 days | Status: CP
Start: 2019-02-03 — End: 2019-03-13
  Filled 2019-02-03: qty 30, 30d supply, fill #0

## 2019-02-03 MED ORDER — CEFDINIR 300 MG CAPSULE
ORAL_CAPSULE | 2 refills | 0 days | Status: CP
Start: 2019-02-03 — End: ?
  Filled 2019-02-03: qty 60, 30d supply, fill #0

## 2019-02-03 MED FILL — SPIRONOLACTONE 100 MG TABLET: 30 days supply | Qty: 30 | Fill #0 | Status: AC

## 2019-02-03 MED FILL — CEFDINIR 300 MG CAPSULE: 30 days supply | Qty: 60 | Fill #0 | Status: AC

## 2019-02-03 MED FILL — METFORMIN 500 MG TABLET: 30 days supply | Qty: 60 | Fill #0 | Status: AC

## 2019-03-10 MED FILL — HYDROCHLOROTHIAZIDE 25 MG TABLET: ORAL | 30 days supply | Qty: 30 | Fill #2

## 2019-03-10 MED FILL — HYDROCHLOROTHIAZIDE 25 MG TABLET: 30 days supply | Qty: 30 | Fill #2 | Status: AC

## 2019-03-13 ENCOUNTER — Institutional Professional Consult (permissible substitution)
Admit: 2019-03-13 | Discharge: 2019-03-14 | Payer: PRIVATE HEALTH INSURANCE | Attending: Family Medicine | Primary: Family Medicine

## 2019-03-13 DIAGNOSIS — L732 Hidradenitis suppurativa: Secondary | ICD-10-CM

## 2019-03-13 DIAGNOSIS — I10 Essential (primary) hypertension: Principal | ICD-10-CM

## 2019-03-13 DIAGNOSIS — R7309 Other abnormal glucose: Secondary | ICD-10-CM

## 2019-03-13 DIAGNOSIS — M545 Low back pain: Secondary | ICD-10-CM

## 2019-03-13 DIAGNOSIS — J3089 Other allergic rhinitis: Secondary | ICD-10-CM

## 2019-03-13 MED ORDER — FLUTICASONE PROPIONATE 50 MCG/ACTUATION NASAL SPRAY,SUSPENSION
Freq: Two times a day (BID) | NASAL | 11 refills | 0 days | Status: CP
Start: 2019-03-13 — End: 2020-03-12

## 2019-03-13 MED ORDER — METFORMIN ER 500 MG TABLET,EXTENDED RELEASE 24 HR
ORAL_TABLET | Freq: Every day | ORAL | 3 refills | 0.00000 days | Status: CP
Start: 2019-03-13 — End: 2020-03-12

## 2019-03-13 MED ORDER — SPIRONOLACTONE 25 MG TABLET
ORAL_TABLET | Freq: Every day | ORAL | 3 refills | 0.00000 days | Status: CP
Start: 2019-03-13 — End: 2020-03-12

## 2019-06-26 MED FILL — HYDROCHLOROTHIAZIDE 25 MG TABLET: ORAL | 30 days supply | Qty: 30 | Fill #3

## 2019-06-26 MED FILL — HYDROCHLOROTHIAZIDE 25 MG TABLET: 30 days supply | Qty: 30 | Fill #3 | Status: AC

## 2019-08-21 MED FILL — HYDROCHLOROTHIAZIDE 25 MG TABLET: 30 days supply | Qty: 30 | Fill #4 | Status: AC

## 2019-08-21 MED FILL — HYDROCHLOROTHIAZIDE 25 MG TABLET: ORAL | 30 days supply | Qty: 30 | Fill #4

## 2019-09-08 ENCOUNTER — Encounter
Admit: 2019-09-08 | Discharge: 2019-09-09 | Payer: PRIVATE HEALTH INSURANCE | Attending: Internal Medicine | Primary: Internal Medicine

## 2019-09-10 ENCOUNTER — Ambulatory Visit: Admit: 2019-09-10 | Discharge: 2019-09-11 | Payer: PRIVATE HEALTH INSURANCE

## 2019-09-10 ENCOUNTER — Encounter: Admit: 2019-09-10 | Discharge: 2019-09-11 | Payer: PRIVATE HEALTH INSURANCE

## 2019-09-10 DIAGNOSIS — L732 Hidradenitis suppurativa: Principal | ICD-10-CM

## 2019-09-10 DIAGNOSIS — M25561 Pain in right knee: Principal | ICD-10-CM

## 2019-09-10 DIAGNOSIS — M545 Low back pain: Principal | ICD-10-CM

## 2019-10-13 MED FILL — HYDROCHLOROTHIAZIDE 25 MG TABLET: ORAL | 30 days supply | Qty: 30 | Fill #0

## 2019-10-13 MED FILL — HYDROCHLOROTHIAZIDE 25 MG TABLET: 30 days supply | Qty: 30 | Fill #0 | Status: AC

## 2019-11-24 ENCOUNTER — Encounter
Admit: 2019-11-24 | Discharge: 2019-11-25 | Payer: PRIVATE HEALTH INSURANCE | Attending: Internal Medicine | Primary: Internal Medicine

## 2019-11-24 ENCOUNTER — Encounter: Admit: 2019-11-24 | Discharge: 2019-11-25 | Payer: PRIVATE HEALTH INSURANCE

## 2019-11-24 DIAGNOSIS — M545 Low back pain: Principal | ICD-10-CM

## 2019-11-24 DIAGNOSIS — L732 Hidradenitis suppurativa: Principal | ICD-10-CM

## 2019-11-24 MED ORDER — NAPROXEN 500 MG TABLET
ORAL_TABLET | Freq: Two times a day (BID) | ORAL | 3 refills | 30 days | Status: CP
Start: 2019-11-24 — End: 2020-11-23
  Filled 2019-12-18: qty 60, 30d supply, fill #0

## 2019-12-17 DIAGNOSIS — I10 Essential (primary) hypertension: Principal | ICD-10-CM

## 2019-12-17 MED ORDER — HYDROCHLOROTHIAZIDE 25 MG TABLET
ORAL_TABLET | Freq: Every day | ORAL | 3 refills | 30.00000 days | Status: CP
Start: 2019-12-17 — End: 2020-12-16
  Filled 2019-12-18: qty 30, 30d supply, fill #0

## 2019-12-18 MED FILL — HYDROCHLOROTHIAZIDE 25 MG TABLET: 30 days supply | Qty: 30 | Fill #0 | Status: AC

## 2019-12-18 MED FILL — NAPROXEN 500 MG TABLET: 30 days supply | Qty: 60 | Fill #0 | Status: AC

## 2019-12-23 ENCOUNTER — Encounter
Admit: 2019-12-23 | Discharge: 2020-01-21 | Payer: PRIVATE HEALTH INSURANCE | Attending: Rehabilitative and Restorative Service Providers" | Primary: Rehabilitative and Restorative Service Providers"

## 2020-01-01 ENCOUNTER — Encounter: Admit: 2020-01-01 | Discharge: 2020-01-02 | Payer: PRIVATE HEALTH INSURANCE

## 2020-01-01 DIAGNOSIS — L732 Hidradenitis suppurativa: Principal | ICD-10-CM

## 2020-01-01 DIAGNOSIS — Z6841 Body Mass Index (BMI) 40.0 and over, adult: Principal | ICD-10-CM

## 2020-01-01 DIAGNOSIS — I10 Essential (primary) hypertension: Principal | ICD-10-CM

## 2020-01-01 MED ORDER — CLINDAMYCIN PHOSPHATE 1 % TOPICAL SOLUTION
Freq: Two times a day (BID) | TOPICAL | 0 refills | 0 days | Status: CP
Start: 2020-01-01 — End: 2020-12-31
  Filled 2020-01-06: qty 60, 30d supply, fill #0

## 2020-01-01 MED ORDER — HYDROCHLOROTHIAZIDE 25 MG TABLET
ORAL_TABLET | Freq: Every day | ORAL | 3 refills | 90.00000 days | Status: CP
Start: 2020-01-01 — End: 2020-12-31
  Filled 2020-02-03: qty 90, 90d supply, fill #0

## 2020-01-02 ENCOUNTER — Encounter: Admit: 2020-01-02 | Discharge: 2020-01-03 | Payer: PRIVATE HEALTH INSURANCE

## 2020-01-06 MED FILL — CLINDAMYCIN PHOSPHATE 1 % TOPICAL SOLUTION: 30 days supply | Qty: 60 | Fill #0 | Status: AC

## 2020-01-29 ENCOUNTER — Encounter
Admit: 2020-01-29 | Discharge: 2020-02-20 | Payer: PRIVATE HEALTH INSURANCE | Attending: Rehabilitative and Restorative Service Providers" | Primary: Rehabilitative and Restorative Service Providers"

## 2020-01-29 ENCOUNTER — Ambulatory Visit
Admit: 2020-01-29 | Discharge: 2020-02-20 | Payer: PRIVATE HEALTH INSURANCE | Attending: Rehabilitative and Restorative Service Providers" | Primary: Rehabilitative and Restorative Service Providers"

## 2020-01-29 ENCOUNTER — Ambulatory Visit: Admit: 2020-01-29 | Discharge: 2020-01-30 | Payer: PRIVATE HEALTH INSURANCE

## 2020-01-29 DIAGNOSIS — Z6841 Body Mass Index (BMI) 40.0 and over, adult: Secondary | ICD-10-CM

## 2020-01-29 DIAGNOSIS — I10 Essential (primary) hypertension: Principal | ICD-10-CM

## 2020-02-03 MED FILL — NAPROXEN 500 MG TABLET: 30 days supply | Qty: 60 | Fill #1 | Status: AC

## 2020-02-03 MED FILL — NAPROXEN 500 MG TABLET: ORAL | 30 days supply | Qty: 60 | Fill #1

## 2020-02-03 MED FILL — HYDROCHLOROTHIAZIDE 25 MG TABLET: 90 days supply | Qty: 90 | Fill #0 | Status: AC

## 2020-02-23 ENCOUNTER — Ambulatory Visit: Payer: Medicaid Other | Attending: Internal Medicine

## 2020-02-23 DIAGNOSIS — Z23 Encounter for immunization: Secondary | ICD-10-CM

## 2020-02-23 NOTE — Progress Notes (Signed)
   Covid-19 Vaccination Clinic  Name:  Bridget Tyler    MRN: [id number] DOB: 12/08/1980  02/23/2020  Bridget Tyler was observed post Covid-19 immunization for 15 minutes without incident. She was provided with Vaccine Information Sheet and instruction to access the V-Safe system.   Bridget Tyler was instructed to call 911 with any severe reactions post vaccine: Marland Kitchen Difficulty breathing  . Swelling of face and throat  . A fast heartbeat  . A bad rash all over body  . Dizziness and weakness   Immunizations Administered    Name Date Dose VIS Date Route   Pfizer COVID-19 Vaccine 02/23/2020  5:44 PM 0.3 mL 11/07/2019 Intramuscular   Manufacturer: [employer], [employer]   Lot: [id number]   NDC: [address]-7

## 2020-02-26 ENCOUNTER — Encounter: Admit: 2020-02-26 | Discharge: 2020-02-27 | Payer: PRIVATE HEALTH INSURANCE

## 2020-03-04 ENCOUNTER — Ambulatory Visit
Admit: 2020-03-04 | Discharge: 2020-03-21 | Payer: PRIVATE HEALTH INSURANCE | Attending: Rehabilitative and Restorative Service Providers" | Primary: Rehabilitative and Restorative Service Providers"

## 2020-03-15 ENCOUNTER — Ambulatory Visit: Payer: Medicaid Other | Attending: Internal Medicine

## 2020-03-15 DIAGNOSIS — Z23 Encounter for immunization: Secondary | ICD-10-CM

## 2020-03-15 NOTE — Progress Notes (Signed)
   Covid-19 Vaccination Clinic  Name:  Bridget Tyler    MRN: [id number] DOB: [birth year]  03/15/2020  Bridget Tyler was observed post Covid-19 immunization for 15 minutes without incident. She was provided with Vaccine Information Sheet and instruction to access the V-Safe system.   Bridget Tyler was instructed to call 911 with any severe reactions post vaccine: Marland Kitchen Difficulty breathing  . Swelling of face and throat  . A fast heartbeat  . A bad rash all over body  . Dizziness and weakness   Immunizations Administered    Name Date Dose VIS Date Route   Pfizer COVID-19 Vaccine 03/15/2020  5:14 PM 0.3 mL 01/21/2019 Intramuscular   Manufacturer: [employer], [employer]   Lot: [id number]   NDC: [address]-0

## 2020-04-09 MED FILL — NAPROXEN 500 MG TABLET: ORAL | 30 days supply | Qty: 60 | Fill #2

## 2020-04-09 MED FILL — NAPROXEN 500 MG TABLET: 30 days supply | Qty: 60 | Fill #2 | Status: AC

## 2020-05-24 ENCOUNTER — Telehealth
Admit: 2020-05-24 | Discharge: 2020-05-25 | Payer: PRIVATE HEALTH INSURANCE | Attending: Internal Medicine | Primary: Internal Medicine

## 2020-05-24 MED ORDER — LIDOCAINE 5 % TOPICAL PATCH
MEDICATED_PATCH | Freq: Two times a day (BID) | TRANSDERMAL | 0 refills | 5.00000 days | Status: CP
Start: 2020-05-24 — End: 2021-05-24

## 2020-05-26 DIAGNOSIS — M545 Low back pain: Principal | ICD-10-CM

## 2020-05-26 MED ORDER — LIDOCAINE 5 % TOPICAL PATCH
MEDICATED_PATCH | Freq: Two times a day (BID) | TRANSDERMAL | 2 refills | 15 days | Status: CP | PRN
Start: 2020-05-26 — End: 2020-08-24
  Filled 2020-06-04: qty 30, 30d supply, fill #0

## 2020-06-04 MED FILL — LIDOCAINE 5 % TOPICAL PATCH: 30 days supply | Qty: 30 | Fill #0 | Status: AC

## 2020-06-07 MED FILL — HYDROCHLOROTHIAZIDE 25 MG TABLET: ORAL | 90 days supply | Qty: 90 | Fill #1

## 2020-06-07 MED FILL — NAPROXEN 500 MG TABLET: 30 days supply | Qty: 60 | Fill #3 | Status: AC

## 2020-06-07 MED FILL — HYDROCHLOROTHIAZIDE 25 MG TABLET: 90 days supply | Qty: 90 | Fill #1 | Status: AC

## 2020-06-07 MED FILL — NAPROXEN 500 MG TABLET: ORAL | 30 days supply | Qty: 60 | Fill #3

## 2020-08-14 ENCOUNTER — Ambulatory Visit
Admit: 2020-08-14 | Discharge: 2020-08-15 | Payer: PRIVATE HEALTH INSURANCE | Attending: Student in an Organized Health Care Education/Training Program | Primary: Student in an Organized Health Care Education/Training Program

## 2020-08-14 DIAGNOSIS — L732 Hidradenitis suppurativa: Principal | ICD-10-CM

## 2020-08-14 DIAGNOSIS — Z3046 Encounter for surveillance of implantable subdermal contraceptive: Principal | ICD-10-CM

## 2020-08-14 MED ORDER — DOXYCYCLINE HYCLATE 100 MG CAPSULE
ORAL_CAPSULE | Freq: Two times a day (BID) | ORAL | 0 refills | 7.00000 days | Status: CP
Start: 2020-08-14 — End: 2020-08-21
  Filled 2020-08-23: qty 14, 7d supply, fill #0

## 2020-08-14 MED ORDER — CLINDAMYCIN PHOSPHATE 1 % TOPICAL SOLUTION
Freq: Two times a day (BID) | TOPICAL | 0 refills | 0.00000 days | Status: CP
Start: 2020-08-14 — End: 2021-08-14
  Filled 2020-08-23: qty 60, 30d supply, fill #0

## 2020-08-23 MED FILL — CLINDAMYCIN PHOSPHATE 1 % TOPICAL SOLUTION: 30 days supply | Qty: 60 | Fill #0 | Status: AC

## 2020-08-23 MED FILL — DOXYCYCLINE HYCLATE 100 MG CAPSULE: 7 days supply | Qty: 14 | Fill #0 | Status: AC

## 2020-08-25 ENCOUNTER — Ambulatory Visit
Admit: 2020-08-25 | Discharge: 2020-08-25 | Disposition: A | Discharge status: Home / Self Care | Payer: PRIVATE HEALTH INSURANCE | Attending: Emergency Medicine

## 2020-08-25 ENCOUNTER — Emergency Department
Admit: 2020-08-25 | Discharge: 2020-08-25 | Disposition: A | Discharge status: Home / Self Care | Payer: PRIVATE HEALTH INSURANCE | Attending: Emergency Medicine

## 2020-08-25 DIAGNOSIS — L0291 Cutaneous abscess, unspecified: Principal | ICD-10-CM

## 2020-10-01 MED FILL — HYDROCHLOROTHIAZIDE 25 MG TABLET: ORAL | 90 days supply | Qty: 90 | Fill #2

## 2020-10-01 MED FILL — HYDROCHLOROTHIAZIDE 25 MG TABLET: 90 days supply | Qty: 90 | Fill #2 | Status: AC

## 2020-10-07 ENCOUNTER — Ambulatory Visit
Admit: 2020-10-07 | Discharge: 2020-10-08 | Payer: PRIVATE HEALTH INSURANCE | Attending: Student in an Organized Health Care Education/Training Program | Primary: Student in an Organized Health Care Education/Training Program

## 2020-10-07 DIAGNOSIS — L732 Hidradenitis suppurativa: Principal | ICD-10-CM

## 2020-10-07 DIAGNOSIS — L720 Epidermal cyst: Principal | ICD-10-CM

## 2020-11-15 ENCOUNTER — Ambulatory Visit
Admit: 2020-11-15 | Discharge: 2020-11-16 | Payer: PRIVATE HEALTH INSURANCE | Attending: Student in an Organized Health Care Education/Training Program | Primary: Student in an Organized Health Care Education/Training Program

## 2020-11-15 ENCOUNTER — Ambulatory Visit
Admit: 2020-11-15 | Discharge: 2020-11-16 | Payer: PRIVATE HEALTH INSURANCE | Attending: Family Medicine | Primary: Family Medicine

## 2020-11-15 DIAGNOSIS — L02419 Cutaneous abscess of limb, unspecified: Principal | ICD-10-CM

## 2020-11-15 DIAGNOSIS — L049 Acute lymphadenitis, unspecified: Principal | ICD-10-CM

## 2020-11-15 DIAGNOSIS — L732 Hidradenitis suppurativa: Principal | ICD-10-CM

## 2020-11-15 DIAGNOSIS — L03119 Cellulitis of unspecified part of limb: Principal | ICD-10-CM

## 2020-11-15 MED ORDER — DOXYCYCLINE HYCLATE 100 MG CAPSULE
ORAL_CAPSULE | Freq: Two times a day (BID) | ORAL | 0 refills | 10.00000 days | Status: CP
Start: 2020-11-15 — End: 2020-11-29
  Filled 2020-11-16: qty 20, 10d supply, fill #0

## 2020-11-15 MED ORDER — IBUPROFEN 800 MG TABLET
ORAL_TABLET | 0 refills | 0 days | Status: CP
Start: 2020-11-15 — End: ?
  Filled 2020-11-16: qty 60, 15d supply, fill #0

## 2020-11-15 MED FILL — DOXYCYCLINE HYCLATE 100 MG CAPSULE: 10 days supply | Qty: 20 | Fill #0 | Status: AC

## 2020-11-15 MED FILL — IBUPROFEN 800 MG TABLET: 15 days supply | Qty: 60 | Fill #0 | Status: AC

## 2020-11-29 ENCOUNTER — Ambulatory Visit
Admit: 2020-11-29 | Discharge: 2020-11-30 | Payer: PRIVATE HEALTH INSURANCE | Attending: Internal Medicine | Primary: Internal Medicine

## 2020-11-29 DIAGNOSIS — G5600 Carpal tunnel syndrome, unspecified upper limb: Principal | ICD-10-CM

## 2020-11-29 DIAGNOSIS — M722 Plantar fascial fibromatosis: Principal | ICD-10-CM

## 2020-11-29 DIAGNOSIS — M545 Low back pain, unspecified: Principal | ICD-10-CM

## 2020-11-29 DIAGNOSIS — G8929 Other chronic pain: Principal | ICD-10-CM

## 2020-12-01 ENCOUNTER — Ambulatory Visit: Admit: 2020-12-01 | Discharge: 2020-12-02 | Payer: PRIVATE HEALTH INSURANCE

## 2020-12-01 DIAGNOSIS — L732 Hidradenitis suppurativa: Principal | ICD-10-CM

## 2020-12-01 MED ORDER — SULFAMETHOXAZOLE 800 MG-TRIMETHOPRIM 160 MG TABLET
ORAL_TABLET | Freq: Two times a day (BID) | ORAL | 0 refills | 5.00000 days | Status: CP
Start: 2020-12-01 — End: 2020-12-06
  Filled 2020-12-02: qty 10, 5d supply, fill #0

## 2020-12-02 MED FILL — SULFAMETHOXAZOLE 800 MG-TRIMETHOPRIM 160 MG TABLET: 5 days supply | Qty: 10 | Fill #0 | Status: AC

## 2020-12-20 MED ORDER — CHLORHEXIDINE GLUCONATE 0.12 % MOUTHWASH
0 refills | 0 days
Start: 2020-12-20 — End: ?

## 2020-12-20 MED ORDER — HYDROCODONE 5 MG-ACETAMINOPHEN 325 MG TABLET
ORAL_TABLET | 0 refills | 0 days
Start: 2020-12-20 — End: ?

## 2020-12-20 MED FILL — HYDROCODONE 5 MG-ACETAMINOPHEN 325 MG TABLET: 2 days supply | Qty: 9 | Fill #0

## 2020-12-20 MED FILL — CHLORHEXIDINE GLUCONATE 0.12 % MOUTHWASH: 15 days supply | Qty: 473 | Fill #0

## 2020-12-21 ENCOUNTER — Ambulatory Visit: Admit: 2020-12-21 | Discharge: 2020-12-22 | Payer: PRIVATE HEALTH INSURANCE

## 2020-12-21 DIAGNOSIS — G5600 Carpal tunnel syndrome, unspecified upper limb: Principal | ICD-10-CM

## 2020-12-21 DIAGNOSIS — R200 Anesthesia of skin: Principal | ICD-10-CM

## 2021-01-05 DIAGNOSIS — G5600 Carpal tunnel syndrome, unspecified upper limb: Principal | ICD-10-CM

## 2021-01-25 ENCOUNTER — Ambulatory Visit: Admit: 2021-01-25 | Discharge: 2021-01-26 | Payer: PRIVATE HEALTH INSURANCE

## 2021-01-25 DIAGNOSIS — M6701 Short Achilles tendon (acquired), right ankle: Principal | ICD-10-CM

## 2021-01-25 DIAGNOSIS — M76821 Posterior tibial tendinitis, right leg: Principal | ICD-10-CM

## 2021-01-25 DIAGNOSIS — M7671 Peroneal tendinitis, right leg: Principal | ICD-10-CM

## 2021-01-25 DIAGNOSIS — M722 Plantar fascial fibromatosis: Principal | ICD-10-CM

## 2021-01-25 DIAGNOSIS — M79671 Pain in right foot: Principal | ICD-10-CM

## 2021-01-25 DIAGNOSIS — M6702 Short Achilles tendon (acquired), left ankle: Principal | ICD-10-CM

## 2021-02-01 ENCOUNTER — Ambulatory Visit
Admit: 2021-02-01 | Discharge: 2021-02-02 | Payer: PRIVATE HEALTH INSURANCE | Attending: Orthopaedic Surgery | Primary: Orthopaedic Surgery

## 2021-02-01 DIAGNOSIS — I10 Essential (primary) hypertension: Principal | ICD-10-CM

## 2021-02-01 MED ORDER — HYDROCHLOROTHIAZIDE 25 MG TABLET
ORAL_TABLET | Freq: Every day | ORAL | 0 refills | 30 days | Status: CP
Start: 2021-02-01 — End: 2021-03-03
  Filled 2021-02-02: qty 30, 30d supply, fill #0

## 2021-02-25 ENCOUNTER — Telehealth: Admit: 2021-02-25 | Discharge: 2021-02-26 | Payer: PRIVATE HEALTH INSURANCE

## 2021-02-25 DIAGNOSIS — G4733 Obstructive sleep apnea (adult) (pediatric): Principal | ICD-10-CM

## 2021-02-25 DIAGNOSIS — Z01818 Encounter for other preprocedural examination: Principal | ICD-10-CM

## 2021-02-25 DIAGNOSIS — Z6841 Body Mass Index (BMI) 40.0 and over, adult: Principal | ICD-10-CM

## 2021-02-25 DIAGNOSIS — I10 Essential (primary) hypertension: Principal | ICD-10-CM

## 2021-02-28 ENCOUNTER — Ambulatory Visit: Admit: 2021-02-28 | Discharge: 2021-02-28 | Payer: PRIVATE HEALTH INSURANCE

## 2021-02-28 DIAGNOSIS — L732 Hidradenitis suppurativa: Principal | ICD-10-CM

## 2021-02-28 MED ORDER — ADALIMUMAB PEN CITRATE FREE 40 MG/0.4 ML
SUBCUTANEOUS | 11 refills | 0.00000 days | Status: CP
Start: 2021-02-28 — End: ?

## 2021-02-28 MED ORDER — AMOXICILLIN 875 MG-POTASSIUM CLAVULANATE 125 MG TABLET
ORAL_TABLET | Freq: Two times a day (BID) | ORAL | 4 refills | 30.00000 days | Status: CP
Start: 2021-02-28 — End: 2021-03-30
  Filled 2021-03-02: qty 28, 14d supply, fill #0

## 2021-02-28 MED ORDER — ADALIMUMAB 80 MG/0.8 ML SUBCUTANEOUS PEN KIT
PACK | SUBCUTANEOUS | 0 refills | 0.00000 days | Status: CP
Start: 2021-02-28 — End: ?
  Filled 2021-03-15: qty 3, 28d supply, fill #0

## 2021-03-01 DIAGNOSIS — L732 Hidradenitis suppurativa: Principal | ICD-10-CM

## 2021-03-02 ENCOUNTER — Encounter
Admit: 2021-03-02 | Discharge: 2021-03-02 | Payer: PRIVATE HEALTH INSURANCE | Attending: Student in an Organized Health Care Education/Training Program | Primary: Student in an Organized Health Care Education/Training Program

## 2021-03-02 ENCOUNTER — Ambulatory Visit: Admit: 2021-03-02 | Discharge: 2021-03-02 | Payer: PRIVATE HEALTH INSURANCE

## 2021-03-02 MED ORDER — ACETAMINOPHEN 500 MG TABLET
ORAL_TABLET | Freq: Three times a day (TID) | ORAL | 0 refills | 7.00000 days | Status: CP
Start: 2021-03-02 — End: ?
  Filled 2021-03-02: qty 10, 3d supply, fill #0
  Filled 2021-03-02: qty 42, 7d supply, fill #0

## 2021-03-02 MED ORDER — DOCUSATE SODIUM 100 MG CAPSULE
ORAL_CAPSULE | Freq: Two times a day (BID) | ORAL | 0 refills | 30.00000 days | Status: CP
Start: 2021-03-02 — End: 2021-04-01
  Filled 2021-03-02: qty 60, 30d supply, fill #0

## 2021-03-02 MED ORDER — GABAPENTIN 100 MG CAPSULE
ORAL_CAPSULE | Freq: Three times a day (TID) | ORAL | 0 refills | 5.00000 days | Status: CP
Start: 2021-03-02 — End: 2021-03-07
  Filled 2021-03-02: qty 15, 5d supply, fill #0

## 2021-03-02 MED ORDER — ASCORBIC ACID (VITAMIN C) 500 MG TABLET
ORAL_TABLET | Freq: Every day | ORAL | 0 refills | 14.00000 days | Status: CP
Start: 2021-03-02 — End: ?
  Filled 2021-03-02: qty 14, 14d supply, fill #0

## 2021-03-02 MED ORDER — HYDROCODONE 5 MG-ACETAMINOPHEN 325 MG TABLET
ORAL_TABLET | Freq: Four times a day (QID) | ORAL | 0 refills | 3 days | Status: CP | PRN
Start: 2021-03-02 — End: 2021-03-07

## 2021-03-02 MED ORDER — ONDANSETRON 4 MG DISINTEGRATING TABLET
ORAL_TABLET | Freq: Three times a day (TID) | ORAL | 0 refills | 10.00000 days | Status: CP | PRN
Start: 2021-03-02 — End: 2021-04-01
  Filled 2021-03-02: qty 30, 10d supply, fill #0

## 2021-03-10 NOTE — Unmapped (Signed)
[facility] Specialty Medication Onboarding    Specialty Medication: Humira starter kit and maintenance  Prior Authorization: Approved   Financial Assistance: No - copay  <$25  Final Copay/Day Supply: $3 / 28 days (for loading and maintenance)    Insurance Restrictions: None     Notes to Pharmacist:     The triage team has completed the benefits investigation and has determined that the patient is able to fill this medication at [facility]. Please contact the patient to complete the onboarding or follow up with the prescribing physician as needed.

## 2021-03-14 MED ORDER — HYDROCODONE 5 MG-ACETAMINOPHEN 325 MG TABLET
ORAL_TABLET | Freq: Four times a day (QID) | ORAL | 0 refills | 3.00000 days | PRN
Start: 2021-03-14 — End: 2021-03-19

## 2021-03-14 MED ORDER — GABAPENTIN 100 MG CAPSULE
ORAL_CAPSULE | Freq: Three times a day (TID) | ORAL | 0 refills | 5.00000 days
Start: 2021-03-14 — End: 2021-03-19

## 2021-03-14 MED ORDER — EMPTY CONTAINER
2 refills | 0 days
Start: 2021-03-14 — End: ?

## 2021-03-14 NOTE — Unmapped (Addendum)
Update 4/19 - I spoke with Wendy Chen and relayed Dr. Norval Gable recommended to delay start of Humira until at lest 4/27 to allow a week of healing from her carpal tunnel surgery. We reviewed to watch for signs of infection (redness/drainage around sites of incision, or systemic symptoms like fever/chills) before starting as well. mas    [facility]   Patient Onboarding/Medication Counseling    Wendy Chen is a 40 y.o. female with hidradenitis suppurativa who I am counseling today on initiation of therapy.  I am speaking to the patient.    Was a Nurse, learning disability used for this call? No    Verified patient's date of birth / HIPAA.    Specialty medication(s) to be sent: Inflammatory Disorders: Humira      Non-specialty medications/supplies to be sent: sharps kit      Medications not needed at this time: na         Humira (adalimumab)    Medication & Administration     Dosage: Hidradenitis supprativa: Inject 160mg  under the skin on day 1, 80mg  on day 15, then 40mg  every 7 days starting on day 29    Lab tests required prior to treatment initiation:  ??? Tuberculosis: Tuberculosis screening resulted in a non-reactive Quantiferon TB Gold assay.  ??? Hepatitis B: Hepatitis B serology studies are complete and non-reactive.    Administration:     Prefilled auto-injector pen  1. Gather all supplies needed for injection on a clean, flat working surface: medication pen removed from packaging, alcohol swab, sharps container, etc.  2. Look at the medication label - look for correct medication, correct dose, and check the expiration date  3. Look at the medication - the liquid visible in the window on the side of the pen device should appear clear and colorless  4. Lay the auto-injector pen on a flat surface and allow it to warm up to room temperature for at least 30-45 minutes  5. Select injection site - you can use the front of your thigh or your belly (but not the area 2 inches around your belly button); if someone else is giving you the injection you can also use your upper arm in the skin covering your triceps muscle  6. Prepare injection site - wash your hands and clean the skin at the injection site with an alcohol swab and let it air dry, do not touch the injection site again before the injection  7. Pull the 2 safety caps straight off - gray/white to uncover the needle cover and the plum cap to uncover the plum activator button, do not remove until immediately prior to injection and do not touch the white needle cover  8. Gently squeeze the area of cleaned skin and hold it firmly to create a firm surface at the selected injection site  9. Put the white needle cover against your skin at the injection site at a 90 degree angle, hold the pen such that you can see the clear medication window  10. Press down and hold the pen firmly against your skin, press the plum activator button to initiate the injection, there will be a click when the injection starts  11. Continue to hold the pen firmly against your skin for about 10-15 seconds - the window will start to turn solid yellow  12. To verify the injection is complete after 10-15 seconds, look and ensure the window is solid yellow and then pull the pen away from your skin  13. Dispose  of the used auto-injector pen immediately in your sharps disposal container the needle will be covered automatically  14. If you see any blood at the injection site, press a cotton ball or gauze on the site and maintain pressure until the bleeding stops, do not rub the injection site    Adherence/Missed dose instructions:  If your injection is given more than 3 days after your scheduled injection date ??? consult your pharmacist for additional instructions on how to adjust your dosing schedule.    Goals of Therapy     - Reduce the frequency and severity of new lesions  - Minimize pain and suppuration  - Prevent disease progression and limit scarring  - Maintenance of effective psychosocial functioning    Side Effects & Monitoring Parameters     ??? Injection site reaction (redness, irritation, inflammation localized to the site of administration)  ??? Signs of a common cold ??? minor sore throat, runny or stuffy nose, etc.  ??? Upset stomach  ??? Headache    The following side effects should be reported to the provider:  ??? Signs of a hypersensitivity reaction ??? rash; hives; itching; red, swollen, blistered, or peeling skin; wheezing; tightness in the chest or throat; difficulty breathing, swallowing, or talking; swelling of the mouth, face, lips, tongue, or throat; etc.  ??? Reduced immune function ??? report signs of infection such as fever; chills; body aches; very bad sore throat; ear or sinus pain; cough; more sputum or change in color of sputum; pain with passing urine; wound that will not heal, etc.  Also at a slightly higher risk of some malignancies (mainly skin and blood cancers) due to this reduced immune function.  o In the case of signs of infection ??? the patient should hold the next dose of Humira?? and call your primary care provider to ensure adequate medical care.  Treatment may be resumed when infection is treated and patient is asymptomatic.  ??? Changes in skin ??? a new growth or lump that forms; changes in shape, size, or color of a previous mole or marking  ??? Signs of unexplained bruising or bleeding ??? throwing up blood or emesis that looks like coffee grounds; black, tarry, or bloody stool; etc.  ??? Signs of new or worsening heart failure ??? shortness of breath; sudden weight gain; heartbeat that is not normal; swelling in the arms or legs that is new or worse      Contraindications, Warnings, & Precautions     ??? Have your bloodwork checked as you have been told by your prescriber  ??? Talk with your doctor if you are pregnant, planning to become pregnant, or breastfeeding  ??? Discuss the possible need for holding your dose(s) of Humira?? when a planned procedure is scheduled with the prescriber as it may delay healing/recovery timeline       Drug/Food Interactions     ??? Medication list reviewed in Epic. The patient was instructed to inform the care team before taking any new medications or supplements. No drug interactions identified.   ??? Talk with you prescriber or pharmacist before receiving any live vaccinations while taking this medication and after you stop taking it    Storage, Handling Precautions, & Disposal     ??? Store this medication in the refrigerator.  Do not freeze  ??? If needed, you may store at room temperature for up to 14 days  ??? Store in original packaging, protected from light  ??? Do not shake  ??? Dispose  of used syringes/pens in a sharps disposal container            Current Medications (including OTC/herbals), Comorbidities and Allergies     Current Outpatient Medications   Medication Sig Dispense Refill   ??? acetaminophen (TYLENOL) 500 MG tablet Take 2 tablets (1,000 mg total) by mouth every eight (8) hours. 42 tablet 0   ??? HUMIRA PEN CITRATE FREE STARTER PACK FOR CROHN'S/UC/HS 3 X 80 MG/0.8 ML Inject the contents of 2 pens (160mg ) under the skin on day 1, THEN inject 1 pen (80mg ) on day 15. 3 each 0   ??? ADALIMUMAB PEN CITRATE FREE 40 MG/0.4 ML Inject the contents of 1 pen (40mg ) under the skin weeky as maintenance. 4 each 11   ??? amoxicillin-clavulanate (AUGMENTIN) 875-125 mg per tablet Take 1 tablet by mouth Two (2) times a day. 60 tablet 4   ??? ascorbic acid, vitamin C, (VITAMIN C) 500 MG tablet Take 1 tablet (500 mg total) by mouth daily. 14 tablet 0   ??? clindamycin (CLEOCIN T) 1 % external solution Apply topically Two (2) times a day. 60 mL 0   ??? docusate sodium (COLACE) 100 MG capsule Take 1 capsule (100 mg total) by mouth Two (2) times a day. 60 capsule 0   ??? gabapentin (NEURONTIN) 100 MG capsule Take 1 capsule (100 mg total) by mouth Three (3) times a day for 5 days. 15 capsule 0   ??? hydroCHLOROthiazide (HYDRODIURIL) 25 MG tablet Take 1 tablet (25 mg total) by mouth daily. 30 tablet 0 ??? ibuprofen (MOTRIN) 800 MG tablet Take 1 tablet 3 or 4 times a day as needed for pain and inflammation. Take with food to prevent stomach problems. 60 tablet 0   ??? ondansetron (ZOFRAN-ODT) 4 MG disintegrating tablet Dissolve 1 tablet on the tongue every eight (8) hours as needed for nausea. 30 tablet 0     No current facility-administered medications for this visit.       No Known Allergies    Patient Active Problem List   Diagnosis   ??? Essential hypertension   ??? 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 Hx of trauma (psychological)   ??? Migraines   ??? Heart murmur   ??? Sleep apnea, obstructive   ??? Encounter for Nexplanon removal   ??? BMI 45.0-49.9, adult (CMS-HCC)   ??? Hidradenitis suppurativa   ??? Medication side effect   ??? Tension headache   ??? Low back pain   ??? EIC (epidermal inclusion cyst)   ??? Right carpal tunnel syndrome   ??? Left carpal tunnel syndrome       Reviewed and up to date in Epic.    Appropriateness of Therapy     Acute infections noted within Epic:  No active infections  Patient reported infection: None    Is medication and dose appropriate based on diagnosis and infection status? Yes    Prescription has been clinically reviewed: Yes      Baseline Quality of Life Assessment      How many days over the past month did your HS  keep you from your normal activities? For example, brushing your teeth or getting up in the morning. Patient declined to answer    Financial Information     Medication Assistance provided: Prior Authorization    Anticipated copay of $3 reviewed with patient. Verified delivery address.    Delivery Information     Scheduled delivery date: Tues, 4/19    Expected  start date: TBD - having carpal tunnel surgery later this week. I'll tag surgeon to see if they'd like [place] to hold start date. Advised to patient likely 7 days.    Medication will be delivered via Same Day Courier to the prescription address in [facility].  This shipment will not require a signature. Explained the services we provide at [facility] and that each month we would call to set up refills.  Stressed importance of returning phone calls so that we could ensure they receive their medications in time each month.  Informed patient that we should be setting up refills 7-10 days prior to when they will run out of medication.  A pharmacist will reach out to perform a clinical assessment periodically.  Informed patient that a welcome packet, containing information about our pharmacy and other support services, a Notice of Privacy Practices, and a drug information handout will be sent.      Patient verbalized understanding of the above information as well as how to contact the pharmacy at [phone] option 4 with any questions/concerns.  The pharmacy is open Monday through Friday 8:30am-4:30pm.  A pharmacist is available 24/7 via pager to answer any clinical questions they may have.    Patient Specific Needs     - Does the patient have any physical, cognitive, or cultural barriers? No    - Patient prefers to have medications discussed with  Patient     - Is the patient or caregiver able to read and understand education materials at a high school level or above? Yes    - Patient's primary language is  English     - Is the patient high risk? No    - Does the patient require a Care Management Plan? No     - Does the patient require physician intervention or other additional services (i.e. nutrition, smoking cessation, social work)? No      Wendy Chen A Desiree Lucy [facility] Specialty Pharmacist

## 2021-03-15 MED FILL — EMPTY CONTAINER: 120 days supply | Qty: 1 | Fill #0

## 2021-03-15 NOTE — Unmapped (Signed)
This H&P has been copied & pasted from the previous clinical encounter for preoperative purposes. Patient to be re-examined in preoperative area prior to procedure.    ORTHOPAEDIC CLINIC NOTE     ASSESSMENT:  Wendy Chen is a 40 y.o. right-hand dominant female with bilateral carpal tunnel syndrome, right > left.    PLAN:  We reviewed the results of her electrical studies which confirm right worse than left carpal tunnel syndrome.  We discussed options for treatment including continued splinting, cortisone injection, and surgical release.  Given the severity of her carpal tunnel and the duration of her symptoms I believe the carpal tunnel release is most appropriate.  We discussed the risks, benefits, alternatives, and complications associated with the procedure as well as the expected postoperative course.  She is interested in proceeding.  Preoperative paperwork was completed today.  I will see her back day of surgery.    PROCEDURES:  None    SUBJECTIVE:  Chief Complaint:  Bilateral hand numbness/tingling.    History of Present Illness:   Wendy Chen is a 40 y.o. right-hand dominant female who presents for evaluation of bilateral hand numbness/tingling, right > left.     Patient has developed increasing numbness and tingling of bilateral hands over the past years. Right hand is more bothersome than the left. Symptoms are becoming more bothersome and patient occasionally drops things at work. Prior interventions include nighttime splinting which did not provide much relief. She has not had steroid injections before.    EMG on 12/21/20 demonstrates severe carpal tunnel syndrome on the right and moderate carpal tunnel syndrome on the left.      Medical History   Past Medical History:   Diagnosis Date   ??? AMA age 53 10/04/2016    We discussed the patient's age related risk of aneuploidy. ??We discussed screening options including serum screening and NIPT as well as diagnostic options of CVS and amniocentesis. ??We discussed risks, benefits and diagnostic capability of both CVS and amniocentesis.   -ordered genetic consult and first trimester screen  -Age 40 at [facility] -S/p genetic counseling 11/17 -Negative cfDNA -Fetal fraction 1.5% (Fetal fraction is affected by fetal aneuploidy (trisomy 13 and 18), maternal BMI, and other maternal conditions such as preexisting hypertension. In the setting of low fetal fraction (typically defined in the literature as <4%) sensitivity of cfDNA screening may be reduced Janyth Contes, 2015. Reprod Sci).     ??? Heart murmur     benign, no abx prophylaxis required, 2012 echo noted trivial TR   ??? Hidradenitis suppurativa 2018   ??? Hypertension    ??? Migraine    ??? Obesity    ??? Postpartum depression 2007, 2012   ??? Procreative genetic counseling 10/05/2016    Genetic counseling visit on 10/09/16 Aneuploidy screening/ testing: cfDNA; results negative - low fetal fraction Carrier screening: [x]  Cystic fibrosis - neg x 106 mutationss [x]  Spinal muscular atrophy - 3 copies; reduced carrier risk [x]  Hemoglobinopathy screening - normal adult hemoglobin present     ??? Sleep apnea    ??? Snoring 12/08/2016        Surgical History   Past Surgical History:   Procedure Laterality Date   ??? CESAREAN SECTION  2007, 2012   ??? PR CESAREAN DELIVERY ONLY N/A 04/03/2017    Procedure: CESAREAN DELIVERY ONLY;  Surgeon: Barb Merino, MD;  Location: L&D C-SECTION OR SUITES [facility];  Service: Family Planning   ??? PR REVISE MEDIAN N/CARPAL TUNNEL SURG Right 03/02/2021  Procedure: NEUROPLASTY AND/OR TRANSPOSITION; MEDIAN NERVE AT CARPAL TUNNEL;  Surgeon: Theodora Blow Jacqlyn Krauss, MD;  Location: [facility] OR [facility];  Service: Orthopedics   ??? WISDOM TOOTH EXTRACTION        Medications   Current Outpatient Medications   Medication Sig Dispense Refill   ??? acetaminophen (TYLENOL) 500 MG tablet Take 2 tablets (1,000 mg total) by mouth every eight (8) hours. 42 tablet 0   ??? HUMIRA PEN CITRATE FREE STARTER PACK FOR CROHN'S/UC/HS 3 X 80 MG/0.8 ML Inject the contents of 2 pens (160mg ) under the skin on day 1, THEN inject 1 pen (80mg ) on day 15. 3 each 0   ??? ADALIMUMAB PEN CITRATE FREE 40 MG/0.4 ML Inject the contents of 1 pen (40mg ) under the skin weeky as maintenance. 4 each 11   ??? amoxicillin-clavulanate (AUGMENTIN) 875-125 mg per tablet Take 1 tablet by mouth Two (2) times a day. 60 tablet 4   ??? ascorbic acid, vitamin C, (VITAMIN C) 500 MG tablet Take 1 tablet (500 mg total) by mouth daily. 14 tablet 0   ??? clindamycin (CLEOCIN T) 1 % external solution Apply topically Two (2) times a day. 60 mL 0   ??? docusate sodium (COLACE) 100 MG capsule Take 1 capsule (100 mg total) by mouth Two (2) times a day. 60 capsule 0   ??? empty container Misc Use as directed to dispose of Humira pens. 1 each 2   ??? gabapentin (NEURONTIN) 100 MG capsule Take 1 capsule (100 mg total) by mouth Three (3) times a day for 5 days. 15 capsule 0   ??? hydroCHLOROthiazide (HYDRODIURIL) 25 MG tablet Take 1 tablet (25 mg total) by mouth daily. 30 tablet 0   ??? ibuprofen (MOTRIN) 800 MG tablet Take 1 tablet 3 or 4 times a day as needed for pain and inflammation. Take with food to prevent stomach problems. 60 tablet 0   ??? ondansetron (ZOFRAN-ODT) 4 MG disintegrating tablet Dissolve 1 tablet on the tongue every eight (8) hours as needed for nausea. 30 tablet 0     No current facility-administered medications for this visit.      Allergies   Patient has no known allergies.     Social History Tobacco use: denies.  Alcohol use: denies.  Drug use: denies.  Employment: currently employed as a Engineer, production.     Family History The patient's family history includes Alzheimer's disease in her paternal uncle; Cancer in her maternal uncle; Diabetes in her father, maternal grandfather, maternal grandmother, and mother; Heart attack in her brother; Heart murmur in her maternal grandfather and maternal grandmother; Hypertension in her brother, father, mother, and sister; Seizures in her brother; Stroke in her brother..         Review of Systems A 10 system review of systems in addition to the musculoskeletal system was performed by intake questionnaire and was negative except as noted in HPI.     OBJECTIVE:  Physical Examination:    General  Well nourished, appearing stated age   Not in acute distress   Alert and oriented x3  Appropriate affect and mood  Appropriate to conversation   No increased work of breathing on room air    Musculoskeletal  Right Upper Extremity:  Skin  -appears pink and well perfused with no evidence of trauma.   ROM  -active ROM fingers, wrist, elbow is painless.   Inspection/Palpation  - No tenderness to palpation about the elbow, wrist, or hand  - No wasting of the  thenar or interossei muscles appreciated  Motor/Strength  -Wrist extension, wrist flexion, IO, grip strength 5/5  Sensory  -median nerve distribution with diminished sensation to light touch   Stability  - No gross instability noted at the elbow, wrist, or fingers  Vascular  -fingers are warm, with brisk capillary refill  Provocative testing  -Carpal tunnel Tinel's negative   Durkin's compressive testing positive at the carpal tunnel     Left Upper Extremity:  Skin  -appears pink and well perfused with no evidence of trauma.   ROM  -active ROM fingers, wrist, elbow is painless.   Inspection/Palpation  - No tenderness to palpation about the elbow, wrist, or hand  - No wasting of the thenar or interossei muscles appreciated  Motor/Strength  -Wrist extension, wrist flexion, IO, grip strength 5/5  Sensory  -median nerve distribution with diminished sensation to light touch   Stability  - No gross instability noted at the elbow, wrist, or fingers  Vascular  -fingers are warm, with brisk capillary refill  Provocative testing  -Carpal tunnel Tinel's negative   Durkin's compressive testing positive at the carpal tunnel       Test Results  Imaging  EMG 12/21/20 demonstrates bilateral carpal tunnel syndrome, with severe disease on the right and moderate disease on the left    Problem List  Active Problems:    * No active hospital problems. *

## 2021-03-16 ENCOUNTER — Ambulatory Visit: Admit: 2021-03-16 | Discharge: 2021-03-16 | Payer: PRIVATE HEALTH INSURANCE

## 2021-03-16 ENCOUNTER — Encounter: Admit: 2021-03-16 | Discharge: 2021-03-16 | Payer: PRIVATE HEALTH INSURANCE

## 2021-03-16 DIAGNOSIS — I10 Essential (primary) hypertension: Principal | ICD-10-CM

## 2021-03-16 MED ORDER — ONDANSETRON 4 MG DISINTEGRATING TABLET
ORAL_TABLET | Freq: Three times a day (TID) | ORAL | 0 refills | 4.00000 days | Status: CP | PRN
Start: 2021-03-16 — End: 2021-03-19
  Filled 2021-03-16: qty 10, 3d supply, fill #0

## 2021-03-16 MED ORDER — GABAPENTIN 100 MG CAPSULE
ORAL_CAPSULE | Freq: Three times a day (TID) | ORAL | 0 refills | 5.00000 days | Status: CP
Start: 2021-03-16 — End: 2021-03-21
  Filled 2021-03-16: qty 15, 5d supply, fill #0

## 2021-03-16 MED ORDER — HYDROCODONE 5 MG-ACETAMINOPHEN 325 MG TABLET
ORAL_TABLET | Freq: Four times a day (QID) | ORAL | 0 refills | 3.00000 days | Status: CP | PRN
Start: 2021-03-16 — End: 2021-03-16
  Filled 2021-03-16: qty 10, 2d supply, fill #0

## 2021-03-16 MED ADMIN — acetaminophen (TYLENOL) tablet 975 mg: 975 mg | ORAL | @ 14:00:00 | Stop: 2021-03-16

## 2021-03-16 MED ADMIN — lactated Ringers infusion: 10 mL/h | INTRAVENOUS | @ 14:00:00 | Stop: 2021-03-16

## 2021-03-16 MED ADMIN — fentaNYL (PF) (SUBLIMAZE) injection: INTRAVENOUS | @ 16:00:00 | Stop: 2021-03-16

## 2021-03-16 MED ADMIN — labetaloL (NORMODYNE,TRANDATE) injection: INTRAVENOUS | @ 17:00:00 | Stop: 2021-03-16

## 2021-03-16 MED ADMIN — fentaNYL (PF) (SUBLIMAZE) injection: INTRAVENOUS | @ 17:00:00 | Stop: 2021-03-16

## 2021-03-16 MED ADMIN — bupivacaine (PF) (MARCAINE) 0.5 % (5 mg/mL) injection (PF): @ 17:00:00 | Stop: 2021-03-16

## 2021-03-16 MED ADMIN — dexamethasone (DECADRON) 4 mg/mL injection: INTRAVENOUS | @ 16:00:00 | Stop: 2021-03-16

## 2021-03-16 MED ADMIN — lidocaine (XYLOCAINE) 20 mg/mL (2 %) injection: INTRAVENOUS | @ 16:00:00 | Stop: 2021-03-16

## 2021-03-16 MED ADMIN — ketamine (KETALAR) injection: INTRAVENOUS | @ 16:00:00 | Stop: 2021-03-16

## 2021-03-16 MED ADMIN — sodium chloride irrigation (NS) 0.9 % irrigation solution: @ 16:00:00 | Stop: 2021-03-16

## 2021-03-16 MED ADMIN — midazolam (VERSED) injection: INTRAVENOUS | @ 16:00:00 | Stop: 2021-03-16

## 2021-03-16 MED ADMIN — ketorolac (TORADOL) injection: INTRAVENOUS | @ 17:00:00 | Stop: 2021-03-16

## 2021-03-16 MED ADMIN — ondansetron (ZOFRAN) injection: INTRAVENOUS | @ 16:00:00 | Stop: 2021-03-16

## 2021-03-16 MED ADMIN — propofoL (DIPRIVAN) injection: INTRAVENOUS | @ 16:00:00 | Stop: 2021-03-16

## 2021-03-16 MED ADMIN — ceFAZolin (ANCEF) IVPB 2 g in 50 ml dextrose (premix): 2 g | INTRAVENOUS | @ 16:00:00 | Stop: 2021-03-16

## 2021-03-16 NOTE — Unmapped (Signed)
Preoperative Diagnosis:    Left carpal tunnel syndrome.    Postoperative Diagnosis:    Same.    Procedure(s)  Performed:    Left carpal tunnel release.    Teaching Surgeon:  Abram Sander, M.D.    Assistants:  Cheri Fowler MD.    Drains: None.     Specimens: None.     IV Fluids:  Please see Anesthesia record.    Estimated Blood Loss: Minimal.     Complications: None.     Tourniquet Time:  Please see Anesthesia record.    Indications for Surgery:  The patient is a pleasant 40 y.o. female with a long history of left carpal tunnel syndrome, which has been refractory to conservative management.  She presents to the operating today for carpal tunnel release.    Operative Findings:  please see the dictated body of the operative report.    Procedure:  After the risks, benefits, alternatives and complications of procedure were explained to the patient and her family and all of her questions were answered, she elected to proceed.  Her left upper extremity was marked in the preoperative holding area to clearly identify the operative site.  She was then brought back to the operating room and placed supine on the operative table.  General anesthesia was induced by members of the anesthesia team.  Her left upper extremity was then prepped and draped in the usual sterile manner.  Prior to the start of the surgical procedure, a timeout was made by members of the Anesthesia, nursing and surgical staff to verify the correct patient, correct operative site and correct procedure.  After this had been completed, we commenced with our operation.  The arm was exsanguinated and the tourniquet inflated.    A knife was used to make a 1.5 cm longitudinal incision over the volar aspect of the palm just distal to the volar wrist crease in line with the ring finger ray.  A knife was used to incise the skin.  Electrocautery was used to cauterize the small crossing vessels that were encountered.  The transverse carpal ligament was visualized.  A knife was used to make a small hole in the transverse carpal ligament, taking care to protect the underlying contents of the carpal tunnel. After we gained entry into the carpal tunnel, blunt dissection was used to define the dorsal and volar aspects of the distal transverse carpal ligament and this was then divided under direct visualization in its entirety.  After complete distal release, we directed our attention proximally.  Blunt dissection was used to define the dorsal and volar aspects of the proximal transverse carpal ligament and this was divided under direct visualization in its entirety along with the distalmost aspect of the volar forearm fascia.  After complete release of the transverse carpal ligament, the median nerve was visualized.  It was found to be intact with an area of compression corresponding to the level of transverse carpal ligament.    The wound was then copiously irrigated with normal saline.  The skin was closed with a 4-0 nylon suture in a horizontal mattress fashion.  Ten mL's of 0.5% Marcaine without epinephrine was injected in subcutaneous tissues for postoperative pain control.  A sterile dressing was applied and the patient was awoken from anesthesia and transferred to recovery.    Condition on transfer: stable.     Counts were correct at the end of the procedure.     DVT prophylaxis:  No DVT prophylaxis is indicated as  risk of DVT is acceptably low.     Teaching Surgeon Attestation:  I was the attending physician and was present for the entirety  of the case.

## 2021-03-16 NOTE — Unmapped (Addendum)
You received 1000 mg of Tylenol at 10:00 am. You can take another dose of Tylenol OR your narcotic pain medication at 4:00 pm.     Do not take more than 4000 mg of Tylenol in a day. This would be 2 extra strength or 3 regular strength Tylenol every 6 hours.

## 2021-03-17 MED ORDER — HYDROCHLOROTHIAZIDE 25 MG TABLET
ORAL_TABLET | Freq: Every day | ORAL | 0 refills | 30.00000 days | Status: CP
Start: 2021-03-17 — End: 2021-04-16
  Filled 2021-03-18: qty 30, 30d supply, fill #0

## 2021-03-17 NOTE — Unmapped (Signed)
Hi Dr. Garner Nash,    Hydrochlorothiazide had a end date wanted to double check to see if okay to refill.  Thanks, Eber Jones

## 2021-03-29 ENCOUNTER — Ambulatory Visit
Admit: 2021-03-29 | Discharge: 2021-03-30 | Payer: PRIVATE HEALTH INSURANCE | Attending: Orthopaedic Surgery | Primary: Orthopaedic Surgery

## 2021-03-29 NOTE — Unmapped (Signed)
INTERIM HISTORY:  [name] returns today for her first postoperative visit after undergoing a right carpal tunnel release on 03/02/21 and a left carpal tunnel release on 03/16/21.  She has been doing very well since her surgery.  She has minimal discomfort.  The numbness and tingling which was present throughout the median distribution preoperatively has resolved. She does endorse mild intermittent right small finger numbness that occurs primarily while sleeping and occasionally wakes her from sleep.      PHYSICAL EXAMINATION:  EXTREMITIES:  Examination of the right upper extremity shows the incision is well healed.  There is minimal tenderness at the surgical site.  There is normal sensation to light touch distally.  Range of motion of the fingers is full with no discomfort.  There is minimal swelling present.  Examination of the left upper extremity shows the incision is well healed.  There is minimal tenderness at the surgical site.  There is normal sensation to light touch distally.  Range of motion of the fingers is full with no discomfort.  There is minimal swelling present.    ASSESSMENT:   1. Right carpal tunnel release on 03/02/21, doing well.   2. Left carpal tunnel release on 03/16/21, doing well.   3. Mild right CuTS.    PLAN:  [name] is doing well.  We removed her sutures from the left hand today.  She will increase her activities as tolerated. Regarding small finger numbness, recommended sleeping with her arms straight to relieve pressure on the ulnar nerve.  She will return to see me in 1 month if she is having any problems or concerns; otherwise, she will see me on as-needed basis.  She will contact us if she is interested in a referral for therapy though for now she does not think it's indicated.    Donzetta Sprung, MS4    Attending Attestation:  I saw and evaluated the patient.  I repeated the physical exam and confirmed the history as noted above.  I determined the assessment and plan of care for the patient.  I reviewed and agree with the documented findings and plan in the student's note above.  --J. Tylene Fantasia. Jarold Motto, MD  Mar 29, 2021 3:56 PM

## 2021-04-07 NOTE — Unmapped (Signed)
Wendy Chen reported she pulled her first Humira injection up too soon, and all of the medication leaked out. She was able to successfully give her second injection on 4/29.     At this point, I advised it was best to go ahead and give second dose (80 mg) on 5/13 as planned.    Maintenance dosing to start on 5/27.    She has continued to have a lot of flares. I transferred over her Augmentin order, and we'll plan to send out for her. I encouraged her to reach out to clinic via MyChart if this doesn't improve in the next week or so.     [facility] Clinical Assessment & Refill Coordination Note    Wendy Chen, [place]: 09-Mar-1981  Phone: [phone] (home)     All above HIPAA information was verified with patient.     Was a Nurse, learning disability used for this call? No    Specialty Medication(s):   Inflammatory Disorders: Humira     Current Outpatient Medications   Medication Sig Dispense Refill   ??? HUMIRA PEN CITRATE FREE STARTER PACK FOR CROHN'S/UC/HS 3 X 80 MG/0.8 ML Inject the contents of 2 pens (160mg ) under the skin on day 1, THEN inject 1 pen (80mg ) on day 15. (Patient not taking: Reported on 03/16/2021) 3 each 0   ??? ADALIMUMAB PEN CITRATE FREE 40 MG/0.4 ML Inject the contents of 1 pen (40mg ) under the skin weeky as maintenance. (Patient not taking: Reported on 03/16/2021) 4 each 11   ??? amoxicillin-clavulanate (AUGMENTIN) 875-125 mg per tablet Take 1 tablet by mouth Two (2) times a day. 60 tablet 4   ??? ascorbic acid, vitamin C, (VITAMIN C) 500 MG tablet Take 1 tablet (500 mg total) by mouth daily. 14 tablet 0   ??? clindamycin (CLEOCIN T) 1 % external solution Apply topically Two (2) times a day. 60 mL 0   ??? empty container Misc Use as directed to dispose of Humira pens. 1 each 2   ??? gabapentin (NEURONTIN) 100 MG capsule Take 1 capsule (100 mg total) by mouth Three (3) times a day for 5 days. 15 capsule 0   ??? hydroCHLOROthiazide (HYDRODIURIL) 25 MG tablet Take 1 tablet (25 mg total) by mouth daily. 30 tablet 0   ??? ibuprofen (MOTRIN) 800 MG tablet Take 1 tablet 3 or 4 times a day as needed for pain and inflammation. Take with food to prevent stomach problems. 60 tablet 0     No current facility-administered medications for this visit.        Changes to medications: Wendy Chen reports no changes at this time.    No Known Allergies    Changes to allergies: No    SPECIALTY MEDICATION ADHERENCE     Humira - 1 left  Medication Adherence    Patient reported X missed doses in the last month: 0  Specialty Medication: Humira          Specialty medication(s) dose(s) confirmed: Regimen is correct and unchanged.     Are there any concerns with adherence? No    Adherence counseling provided? Not needed    CLINICAL MANAGEMENT AND INTERVENTION      Clinical Benefit Assessment:    Do you feel the medicine is effective or helping your condition? No    Clinical Benefit counseling provided? Reasonable expectations discussed: discussed it may take 8-12 weeks for full effect, has only take 1 dose so far    Adverse Effects Assessment:  Are you experiencing any side effects? No    Are you experiencing difficulty administering your medicine? YES - misfired first pen, but didn't report to pharmacy until today. Was able to give second pen successfully. No counseling needed, but I did recommend she let us know if she has trouble in the future.     Quality of Life Assessment:    How many days over the past month did your HS  keep you from your normal activities? For example, brushing your teeth or getting up in the morning. Patient declined to answer    Have you discussed this with your provider? Not needed    Acute Infection Status:    Acute infections noted within Epic:  No active infections  Patient reported infection: None    Therapy Appropriateness:    Is therapy appropriate? Yes, therapy is appropriate and should be continued    DISEASE/MEDICATION-SPECIFIC INFORMATION      For patients on injectable medications: Patient currently has 1 doses left.  Next injection is scheduled for 5/13, maintenance dosing to start on 5/27.    PATIENT SPECIFIC NEEDS     - Does the patient have any physical, cognitive, or cultural barriers? No    - Is the patient high risk? No    - Does the patient require a Care Management Plan? No     - Does the patient require physician intervention or other additional services (i.e. nutrition, smoking cessation, social work)? No      SHIPPING     Specialty Medication(s) to be Shipped:   Inflammatory Disorders: Humira    Other medication(s) to be shipped: No additional medications requested for fill at this time     Changes to insurance: No    Delivery Scheduled: Yes, Expected medication delivery date: Friday, 5/23.     Medication will be delivered via Same Day Courier to the confirmed prescription address in [facility].    The patient will receive a drug information handout for each medication shipped and additional FDA Medication Guides as required.  Verified that patient has previously received a Conservation officer, historic buildings and a Surveyor, mining.    The patient or caregiver noted above participated in the development of this care plan and knows that they can request review of or adjustments to the care plan at any time.      All of the patient's questions and concerns have been addressed.    Lanney Gins   [facility] Specialty Pharmacist

## 2021-04-11 MED FILL — AMOXICILLIN 875 MG-POTASSIUM CLAVULANATE 125 MG TABLET: ORAL | 14 days supply | Qty: 28 | Fill #0

## 2021-04-18 MED FILL — HUMIRA PEN CITRATE FREE 40 MG/0.4 ML: 28 days supply | Qty: 4 | Fill #0

## 2021-05-08 NOTE — Unmapped (Signed)
RHEUMATOLOGY CLINIC FOLLOW-UP NOTE    Assessment/Plan:      Low back pain  R knee pain/swelling  Hx hidradenitis suppurativa  Briefly: Long-standing HS. 2020-2021 rheum eval for joint pain. DDx initially raised query for inflammatory arthritis [risk factor of HS] however inflammatory arthritis not clearly elucidated on serial exams and work-up with greater concerns for mechanical etiologies as previously documented. Since last visit she was started on adalimumab for HS and joint symptoms are improved by today however she is not certain if her joint symptoms  actually improved before or after starting adalimumab. Regardless, as she otherwise feels well, will continue to monitor clinically.  -- Medications: no indication for escalation of immunosuppression at this time.     RTC in Return in 1 year (on 05/09/2022). She will reach out if joint symptoms flare or return before next appointment. If she remains asymptomatic she can call to query if follow-up appt indication/utility as the time approaches.    This patient was seen by Elam Dutch. Patient was discussed separately with Dr. Sullivan Lone who agrees with the plan outlined above.    The patient reports they are currently: at home. I spent 12 minutes on the real-time audio and video visit with the patient on the date of service. I spent an additional 5 minutes on pre- and post-visit activities on the date of service.     The patient was not located and I was located within 250 yards of a hospital based location during the real-time audio and video visit. The patient was physically located in [place] or a state in which I am permitted to provide care. The patient and/or parent/guardian understood that s/he may incur co-pays and cost sharing, and agreed to the telemedicine visit. The visit was reasonable and appropriate under the circumstances given the patient's presentation at the time.    The patient and/or parent/guardian has been advised of the potential risks and limitations of this mode of treatment (including, but not limited to, the absence of in-person examination) and has agreed to be treated using telemedicine. The patient's/patient's family's questions regarding telemedicine have been answered.    If the visit was completed in an ambulatory setting, the patient and/or parent/guardian has also been advised to contact their provider???s office for worsening conditions, and seek emergency medical treatment and/or call 911 if the patient deems either necessary.    Subjective:   Primary Care Provider: Fayne Mediate, MD    HPI:  Wendy Chen is a 40 y.o.  female with a PMH of hidradenitis suppurativa, HTN, OSA, plantar fasciitis, surgical release of bilateral CTS presenting for follow-up. Last seen in clinic 11/2020 at which time there was low suspicion for active inflammatory arthritis.     Medical updates:  -- Followed with PCP.   -- Followed with podiatry for foot pain without indication for surgery.  -- Followed with dermatology with initiation of adalimumab for HS.   -- Underwent R/L CTS release which provided benefit.     Today she presents for follow-up.   She is overall feeling well.  She continues adalimumab every other week.    MSK:  -- In general she notes joints feel very well. On retrospect, she isn't sure if her joints started to improve before or after starting the adalimumab but she has no major articular complaints today.  -- Axial: She notes no low back pain or stiffness. No pain with prolonged sitting, activity, or standing.  -- Peripheral: She continues to live with  some right foot pain particularly on the bottom of foot with the first couple steps of the day. She is doing the stretches/activities that the podiatrist recommended which has helped. No other pain, swelling, stiffness, or ROM limitations in other peripheral articulations.   -- Activity: She is not doing much exercises right now but hopes to get back into it.    HS:  -- Overall HS is much better controlled now after starting adalimumab.    No fevers, chills, chest pain, cough, shortness of breath.    Disease History:  -- Long-standing hx of regular axillae, groin, thighs & buttocks hydradenitis suppurativa. Prior I&Ds & antibiotics. Evaluated by [facility] dermatology 2018.  -- 2020 followed up with dermatology??with concern for onset of joint pains.  -- 2020 evaluated by [facility] rheumatology with the following report:   ~2 year hx new LBP midline & R side associated with AM stiffness & intermittent sharpness that can last a few hours. LBP radiated RLE with shock sensation and caused toss and turning at night. 2020 new hx of a R-sided swollen/warm knee with clicking limiting ROM that improved with increasing diuretics. Knee pain at the end of the day. Bilateral ankle & LE digit pain worse at the end of the day & with prolonged standing. Works in [employer] & frequently on feet. No SpA comorbidities. Xrays with degenerative changes. Exam strongly positive for +Tinels/Phalens and she was referred to EMG/NCS. Overall there was lower suspicion for active inflammatory arthritis with recommendation for monitoring.  -- 2022 started treatment with adalimumab for HS. Underwent bilateral CTS surgical release.    Labs:  -- 2020: ESR 28, CRP 13    Imaging:  -- 2020: Xray R foot: No acute osseous abnormality. Mild degenerative changes along the dorsal midfoot. Calcaneal spurs and thickening of the proximal plantar fascia.  -- 2020: Xray SI joint: No radiographic findings of sacroiliitis.  -- 2020: Xray L-spine: Mild broad levocurvature of the lumbar spine. Loss of lumbar lordosis. No listhesis.??Minimal multilevel degenerative disc disease of the lumbar spine.  -- 2020: Xray R knee: Negative radiographs of the right knee    PMH, PFH, PSH as previously documented.  Medications were reviewed this visit.    Objective:     There were no vitals filed for this visit.  There is no height or weight on file to calculate BMI.    Physical Exam  -- GENERAL: sitting up; no acute distress; appears comfortable  -- EYES: normal sclera; conjunctivae are clear  -- PULM: no visualized increased work of breathing  -- NEURO: alert; oriented

## 2021-05-09 ENCOUNTER — Telehealth
Admit: 2021-05-09 | Discharge: 2021-05-10 | Payer: PRIVATE HEALTH INSURANCE | Attending: Internal Medicine | Primary: Internal Medicine

## 2021-05-09 DIAGNOSIS — L732 Hidradenitis suppurativa: Principal | ICD-10-CM

## 2021-05-09 DIAGNOSIS — M2550 Pain in unspecified joint: Principal | ICD-10-CM

## 2021-05-10 NOTE — Unmapped (Signed)
[facility] Refill Coordination Note    Specialty Medication(s) to be Shipped:   Inflammatory Disorders: Humira    Other medication(s) to be shipped: Augmentin     Wendy Chen, DOB: 01-Aug-1981  Phone: [phone] (home)       All above HIPAA information was verified with patient.     Was a Nurse, learning disability used for this call? No    Completed refill call assessment today to schedule patient's medication shipment from the [facility] [phone]).  All relevant notes have been reviewed.     Specialty medication(s) and dose(s) confirmed: Regimen is correct and unchanged.   Changes to medications: Diany reports no changes at this time.  Changes to insurance: No  New side effects reported not previously addressed with a pharmacist or physician: None reported  Questions for the pharmacist: No    Confirmed patient received a Conservation officer, historic buildings and a Surveyor, mining with first shipment. The patient will receive a drug information handout for each medication shipped and additional FDA Medication Guides as required.       DISEASE/MEDICATION-SPECIFIC INFORMATION        For patients on injectable medications: Patient currently has 0 doses left.  Next injection is scheduled for 05/13/2021.    SPECIALTY MEDICATION ADHERENCE     Medication Adherence    Patient reported X missed doses in the last month: 0  Specialty Medication: HUMIRA(CF) PEN 40 mg/0.4 mL  Patient is on additional specialty medications: No  Any gaps in refill history greater than 2 weeks in the last 3 months: no  Demonstrates understanding of importance of adherence: yes  Informant: patient  Reliability of informant: reliable  Confirmed plan for next specialty medication refill: delivery by pharmacy  Refills needed for supportive medications: not needed              Were doses missed due to medication being on hold? No    Humira CF 40/0.4 mg/ml: 0 days of medicine on hand       REFERRAL TO PHARMACIST Referral to the pharmacist: Not needed      [facility]     Shipping address confirmed in Epic.     Delivery Scheduled: Yes, Expected medication delivery date: 05/12/2021.     Medication will be delivered via Same Day Courier to the prescription address in Epic WAM.    Americo Vallery D Cinque Begley   [facility] Shared [facility] Specialty Technician

## 2021-05-11 NOTE — Unmapped (Signed)
This was a telehealth service where a fellow was involved. I was immediately available in clinic.    Danella Maiers, MD, MSCI  Assistant Professor of Medicine  Department of Medicine/Division of Rheumatology  [place] [facility] [place] [facility]  [phone] clinic phone  [phone] clinic secure fax

## 2021-05-12 MED FILL — AMOXICILLIN 875 MG-POTASSIUM CLAVULANATE 125 MG TABLET: ORAL | 14 days supply | Qty: 28 | Fill #1

## 2021-05-12 MED FILL — HUMIRA PEN CITRATE FREE 40 MG/0.4 ML: 28 days supply | Qty: 4 | Fill #1

## 2021-06-01 NOTE — Unmapped (Signed)
The [facility] has made a second and final attempt to reach this patient to refill the following medication:Humira.      We have left voicemails on the following phone numbers: [phone] and have sent a MyChart message.    Dates contacted: 7/1 and 7/6  Last scheduled delivery: 6/16    The patient may be at risk of non-compliance with this medication. The patient should call the [facility] at [phone] (option 4) to refill medication.    Chadd Tollison D Administrator Shared [facility] Specialty Technician

## 2021-06-13 NOTE — Unmapped (Signed)
[facility] Population Health   Gap Closure Progress Note             Date of Service:  06/13/21     Mode of contact: MyChart message  Outreach outcome: Spoke to pt    Health Maintenance Due:   Health Maintenance Due   Topic Date Due   ??? COVID-19 Vaccine (3 - Pfizer risk series) 04/12/2020       Addressed the following gap(s) with Wendy Chen:  SDOH screening    With the following outcome(s): Gap closed immediately over phone/entered external record    Additional Information/Plan:  I provided an intervention for the Financial Resource Strain and Food Insecurity SDOH domain. The intervention was Other: offered linkage to community resources; patient declines need at this time.       Wendy Chen was provided my direct contact information and was encouraged to contact me should additional needs arise.    Minutes spent providing outreach: 5    Wendy Chen N [employer]  Population Health  [facility]

## 2021-06-23 DIAGNOSIS — I10 Essential (primary) hypertension: Principal | ICD-10-CM

## 2021-06-23 MED ORDER — HYDROCHLOROTHIAZIDE 25 MG TABLET
ORAL_TABLET | Freq: Every day | ORAL | 0 refills | 30 days | Status: CP
Start: 2021-06-23 — End: 2021-07-23
  Filled 2021-06-28: qty 30, 30d supply, fill #0

## 2021-06-24 NOTE — Unmapped (Signed)
[facility] Refill Coordination Note    Specialty Medication(s) to be Shipped:   Inflammatory Disorders: Humira    Other medication(s) to be shipped: hctz     Wendy Chen, DOB: 04-20-1981  Phone: [phone] (home)       All above HIPAA information was verified with patient.     Was a Nurse, learning disability used for this call? No    Completed refill call assessment today to schedule patient's medication shipment from the [facility] [phone]).  All relevant notes have been reviewed.     Specialty medication(s) and dose(s) confirmed: Regimen is correct and unchanged.   Changes to medications: [name] reports no changes at this time.  Changes to insurance: No  New side effects reported not previously addressed with a pharmacist or physician: None reported  Questions for the pharmacist: No    Confirmed patient received a Conservation officer, historic buildings and a Surveyor, mining with first shipment. The patient will receive a drug information handout for each medication shipped and additional FDA Medication Guides as required.       DISEASE/MEDICATION-SPECIFIC INFORMATION        For patients on injectable medications: Patient currently has 0 doses left.  Next injection is scheduled for asap - will plan to take 8/2 when it arrives.    SPECIALTY MEDICATION ADHERENCE     Medication Adherence    Patient reported X missed doses in the last month: 2  Specialty Medication: Humira              Were doses missed due to medication being on hold? No    Humira CF 40/0.4 mg/ml: 0 days of medicine on hand       REFERRAL TO PHARMACIST     Referral to the pharmacist: Yes - pharmacist completed call due to compliance concerns. Patient now having some flares of HS. She missed our calls and would call back later after hours).       SHIPPING     Shipping address confirmed in Epic.     Delivery Scheduled: Yes, Expected medication delivery date: 8/2.     Medication will be delivered via Same Day Courier to the prescription address in Epic WAM.    Wendy Chen [facility] Specialty Pharmacist

## 2021-06-28 MED FILL — HUMIRA PEN CITRATE FREE 40 MG/0.4 ML: 28 days supply | Qty: 4 | Fill #2

## 2021-07-11 DIAGNOSIS — I10 Essential (primary) hypertension: Principal | ICD-10-CM

## 2021-07-11 MED ORDER — HYDROCHLOROTHIAZIDE 25 MG TABLET
ORAL_TABLET | Freq: Every day | ORAL | 0 refills | 30 days | Status: CP
Start: 2021-07-11 — End: 2021-08-10
  Filled 2021-07-21: qty 30, 30d supply, fill #0

## 2021-07-13 NOTE — Unmapped (Signed)
[facility] Refill Coordination Note    Specialty Medication(s) to be Shipped:   Inflammatory Disorders: Humira    Other medication(s) to be shipped: Hctz 25mg      Wendy Chen, DOB: 1981/04/29  Phone: [phone] (home)       All above HIPAA information was verified with patient.     Was a Nurse, learning disability used for this call? No    Completed refill call assessment today to schedule patient's medication shipment from the [facility] [phone]).  All relevant notes have been reviewed.     Specialty medication(s) and dose(s) confirmed: Regimen is correct and unchanged.   Changes to medications: Jalyric reports no changes at this time.  Changes to insurance: No  New side effects reported not previously addressed with a pharmacist or physician: None reported  Questions for the pharmacist: No    Confirmed patient received a Conservation officer, historic buildings and a Surveyor, mining with first shipment. The patient will receive a drug information handout for each medication shipped and additional FDA Medication Guides as required.       DISEASE/MEDICATION-SPECIFIC INFORMATION        For patients on injectable medications: Patient currently has 2 doses left.  Next injection is scheduled for 08/19,08/26.    SPECIALTY MEDICATION ADHERENCE     Medication Adherence    Patient reported X missed doses in the last month: 0  Specialty Medication: humira 40mg /0.21ml  Patient is on additional specialty medications: No  Patient is on more than two specialty medications: No  Any gaps in refill history greater than 2 weeks in the last 3 months: no  Demonstrates understanding of importance of adherence: yes  Informant: patient  Reliability of informant: reliable  Provider-estimated medication adherence level: good  Patient is at risk for Non-Adherence: No  Reasons for non-adherence: no problems identified              Were doses missed due to medication being on hold? No    Humira 40/0.4 mg/ml: 14 days of medicine on hand         REFERRAL TO PHARMACIST     Referral to the pharmacist: Not needed      [facility]     Shipping address confirmed in Epic.     Delivery Scheduled: Yes, Expected medication delivery date: 08/25.     Medication will be delivered via Same Day Courier to the prescription address in Epic WAM.    Antonietta Barcelona   [facility] Specialty Technician

## 2021-07-21 MED FILL — HUMIRA PEN CITRATE FREE 40 MG/0.4 ML: 28 days supply | Qty: 4 | Fill #3

## 2021-08-09 DIAGNOSIS — I10 Essential (primary) hypertension: Principal | ICD-10-CM

## 2021-08-09 MED ORDER — HYDROCHLOROTHIAZIDE 25 MG TABLET
ORAL_TABLET | Freq: Every day | ORAL | 0 refills | 30 days
Start: 2021-08-09 — End: 2021-09-08

## 2021-08-10 MED ORDER — HYDROCHLOROTHIAZIDE 25 MG TABLET
ORAL_TABLET | Freq: Every day | ORAL | 1 refills | 30 days | Status: CP
Start: 2021-08-10 — End: 2021-09-09
  Filled 2021-08-18: qty 30, 30d supply, fill #0

## 2021-08-11 NOTE — Unmapped (Signed)
[facility] Refill Coordination Note    Specialty Medication(s) to be Shipped:   Inflammatory Disorders: Humira    Other medication(s) to be shipped: Hctz 25mg      Wendy Chen, DOB: 1981/01/10  Phone: [phone] (home)       All above HIPAA information was verified with patient.     Was a Nurse, learning disability used for this call? No    Completed refill call assessment today to schedule patient's medication shipment from the [facility] [phone]).  All relevant notes have been reviewed.     Specialty medication(s) and dose(s) confirmed: Regimen is correct and unchanged.   Changes to medications: Zuleyka reports no changes at this time.  Changes to insurance: No  New side effects reported not previously addressed with a pharmacist or physician: None reported  Questions for the pharmacist: No    Confirmed patient received a Conservation officer, historic buildings and a Surveyor, mining with first shipment. The patient will receive a drug information handout for each medication shipped and additional FDA Medication Guides as required.       DISEASE/MEDICATION-SPECIFIC INFORMATION        For patients on injectable medications: Patient currently has 2 doses left.  Next injection is scheduled for 08/12/2021.    SPECIALTY MEDICATION ADHERENCE     Medication Adherence    Patient reported X missed doses in the last month: 0  Specialty Medication: HUMIRA(CF) PEN 40 mg/0.4 mL  Patient is on additional specialty medications: No  Any gaps in refill history greater than 2 weeks in the last 3 months: no  Demonstrates understanding of importance of adherence: yes  Informant: patient  Reliability of informant: reliable  Confirmed plan for next specialty medication refill: delivery by pharmacy  Refills needed for supportive medications: not needed              Were doses missed due to medication being on hold? No    Humira CF 40/0.4 mg/ml: 14 days of medicine on hand         REFERRAL TO PHARMACIST Referral to the pharmacist: Not needed      [facility]     Shipping address confirmed in Epic.     Delivery Scheduled: Yes, Expected medication delivery date: 08/18/2021.     Medication will be delivered via Same Day Courier to the prescription address in Epic WAM.    Abbagayle Zaragoza D Darvin Dials   [facility] Shared [facility] Specialty Technician

## 2021-08-18 MED FILL — HUMIRA PEN CITRATE FREE 40 MG/0.4 ML: 28 days supply | Qty: 4 | Fill #4

## 2021-08-22 ENCOUNTER — Ambulatory Visit
Admit: 2021-08-22 | Discharge: 2021-08-23 | Payer: PRIVATE HEALTH INSURANCE | Attending: Student in an Organized Health Care Education/Training Program | Primary: Student in an Organized Health Care Education/Training Program

## 2021-08-22 LAB — BASIC METABOLIC PANEL
ANION GAP: 10 mmol/L (ref 5–14)
BLOOD UREA NITROGEN: 13 mg/dL (ref 9–23)
BUN / CREAT RATIO: 15
CALCIUM: 9.5 mg/dL (ref 8.7–10.4)
CHLORIDE: 103 mmol/L (ref 98–107)
CO2: 28 mmol/L (ref 20.0–31.0)
CREATININE: 0.84 mg/dL — ABNORMAL HIGH
EGFR CKD-EPI (2021) FEMALE: 90 mL/min/{1.73_m2} (ref >=60)
GLUCOSE RANDOM: 63 mg/dL — ABNORMAL LOW (ref 70–179)
POTASSIUM: 3.3 mmol/L — ABNORMAL LOW (ref 3.4–4.8)
SODIUM: 141 mmol/L (ref 135–145)

## 2021-08-22 MED ORDER — CHLORTHALIDONE 25 MG TABLET
ORAL_TABLET | Freq: Every morning | ORAL | 3 refills | 90 days | Status: CP
Start: 2021-08-22 — End: 2022-08-22
  Filled 2021-09-16: qty 90, 90d supply, fill #0

## 2021-08-22 NOTE — Unmapped (Signed)
[facility]  Established Patient Clinic Note    Assessment/Plan:     Problem List Items Addressed This Visit        Cardiovascular and Mediastinum    Essential hypertension - Primary (Chronic)     Switch from hydrochlorothiazide 25mg  to chlorthalidone 25mg  daily.  BMP today.         Relevant Medications    chlorthalidone (HYGROTON) 25 MG tablet    Other Relevant Orders    Basic Metabolic Panel       Musculoskeletal and Integument    Hidradenitis suppurativa (Chronic)     Doing better on Humira, though now having flare under L axilla (edema and induration, but no obvious fluctuance).  Have messaged her primary dermatologist to discuss best next steps.         Patellofemoral pain syndrome     Pt w/ R knee pain c/w PFPS.  Brace and exercises provided.             Attending: Dr. Cammie Mcgee    Subjective   Wendy Chen is a 40 y.o. female  coming to clinic today for the following issues:    Chief Complaint   Patient presents with   ??? Boil     Under left arm   ??? Back Pain     Right side   ??? Knee Pain     Right     HPI:    #HS  -injecting 40mg  Humira  -having flare under L axilla    #knee pain  -having R knee pain  -locks in bed  -straightens out and hears it pop  -hurts w/ movement, walking/standing    PHQ-2 Score:    PHQ-9 Score: 6  Edinburgh Score:      ROS as above in HPI, otherwise remaining ROS negative.    I have reviewed the problem list, medications, and allergies and have updated/reconciled them if needed.    Wendy Chen  reports that she has never smoked. She has never used smokeless tobacco.  Health Maintenance   Topic Date Due   ??? COVID-19 Vaccine (3 - Pfizer risk series) 04/12/2020   ??? Influenza Vaccine (1) 07/28/2021   ??? HPV Cotest with Pap Smear (21-65)  10/09/2021   ??? Pap Smear with Cotest HPV (21-65)  10/11/2021   ??? Serum Creatinine Monitoring  02/28/2022   ??? Potassium Monitoring  02/28/2022   ??? Lipid Screening  01/01/2025   ??? DTaP/Tdap/Td Vaccines (4 - Td or Tdap) 02/08/2027   ??? Hepatitis C Screen  Completed   ??? Pneumococcal Vaccine 0-64  Aged Out       Objective     VITALS: BP 138/96 (BP Site: L Arm, BP Position: Sitting, BP Cuff Size: Large)  - Pulse 80  - Temp 36.7 ??C (98 ??F) (Temporal)  - Ht 170.2 cm (5' 7)  - Wt (!) 152.9 kg (337 lb)  - BMI 52.78 kg/m??     Physical Exam   Gen: NAD, pleasant and conversant  HEENT: sclera anicteric, normocephalic/atraumatic  Resp: NWOB  CV: hypertensive  Abd: no visible distention   MSK: FROMx4  Skin: warm, dry, well-perfused, L axilla w/ diffuse edema and induration but no discrete area of fluctuance  Extrems: symmetric  Neuro: AAOx3, no gross focal deficits    LABS/IMAGING  I have reviewed pertinent recent labs and imaging in Epic    [facility]  [place] [facility] [place] [facility] [place]  CB# [phone]  [address], [place], [place] [address] ??? Telephone [phone] ??? Fax [phone]  [email or web address]

## 2021-08-22 NOTE — Unmapped (Signed)
Switch from hydrochlorothiazide 25mg  to chlorthalidone 25mg  daily.  BMP today.

## 2021-08-22 NOTE — Unmapped (Signed)
Doing better on Humira, though now having flare under L axilla (edema and induration, but no obvious fluctuance).  Have messaged her primary dermatologist to discuss best next steps.

## 2021-08-22 NOTE — Unmapped (Signed)
Pt w/ R knee pain c/w PFPS.  Brace and exercises provided.

## 2021-08-23 ENCOUNTER — Ambulatory Visit: Admit: 2021-08-23 | Discharge: 2021-08-24 | Payer: PRIVATE HEALTH INSURANCE

## 2021-08-23 NOTE — Unmapped (Signed)
Dermatology Note     Assessment and Plan:      Hidradenitis Suppurativa, flaring of the right axilla   - Previously tried topicals as well as Cefdinir, metformin, spironolactone with no improvement   - Discussed the options of starting a biologic including Humira or remicade. We also discussed the option of acitretin   - Continue Humira 40mg  weekly as this has been helpful over the last year,  Discussed risks of tuberculosis, other uncommon infections, theoretical risk of malignancies, and other uncommon side effects.  - ILK today as below     After the patient was informed of risks, benefits and side effects of intralesional steroid injection, the patient elected to undergo injection. Informed verbal consent was obtained. Risk of atrophy  and dyspigmentation with injection was explained. Kenalog 40 mg/ml was injected locally into the sites located L axilla in a clean fashion following alcohol prep.   Total volume in ml=0.7.  Number of sites treated: 2   Wound care was explained to the patient     High risk medication use (Humira)   - Hepatitis panels and qunatiferon gold normal 02/2021    The patient was advised to call for an appointment should any new, changing, or symptomatic lesions develop.     RTC: 8 months (around 02/2022)  _________________________________________________________________      Chief Complaint     Follow-up of HS    HPI     Wendy Chen is a 40 y.o. female who presents as a returning patient (last seen 02/28/2021) to [facility] for follow-up of hidradenitis suppurativa. At last visit, patient was started on Humira 40mg  weekly and has had overall stabilization of disease activity so is happy with the improvement, but started flaring in the last few days with a growing and painful area in the L axilla that hasn't drained yet.      The patient denies any other new or changing lesions or areas of concern.     Pertinent Past Medical History     No history of skin cancer    Disease course:  Year when symptoms first noticed: 43  Year of diagnosis: 2019  Who diagnosed you? Dermatologist  Location of first symptoms: groin, axillae and inner thighs  Typical involved areas include: groin, axillae, inner thighs and buttocks  Typical number of inflammatory lesions each month at baseline (from first visit): 1-3  Disease triggers: stress, sweat and exercise    Are menstrual cycles irregular when not on birth control? Yes.  Current form of contraception: implant  Effect of hormonal contraception on disease: no change  Difficulty becoming pregnancy? No.  Pregnancy complications? early delivery, pre-eclampsia and c-section  How many children? 3  Better or worse with pregnancy?  no change.  If so, worse during a specific trimester? No change    Family History:   Reviewed in Epic     Past Medical History, Family History, Social History, Medication List, Allergies, and Problem List were reviewed in the rooming section of Epic.     ROS: Other than symptoms mentioned in the HPI, no fevers, chills, or other skin complaints    Physical Examination     Gen: Well-appearing patient, appropriate, interactive, in no acute distress  Skin: Examination of the scalp, face, neck, chest, back, abdomen, bilateral upper and lower extremities, hands, palms, soles, nails, buttocks, and external genitalia performed today and pertinent for:     location Abscess Inflamed nodule Non-inflamed nodule Draining sinus Non-draining Sinus Hurley BSA  at site Color change  Inflamed induration Open skin surface  Tunnels   R axilla     2         L axilla 1 1   1          R inframammary              L inframammary              Intermammary              Pubic              R inguinal              R thigh              L inguinal              L thigh              Scrotum/Vulva              Perianal              R buttock              L buttock              Other (list)                            *0=none, 1=mild, 2=moderate, 3=severe    AN count (total sum of abscess and inflammatory nodule): 2  Pilonidal sinus (Y/N, or previously treated)? No.  Approximate BSA involved by inflammatory lesions: 0.2  Intertriginous comedones: none  Diffuse comedones (trunk, face, etc): none  Acne scars: none  Cribriform scarring: No.  Intertrigionus epidermal inclusion cysts: none  Diffuse (trunk, feace, extremities) epidermal inclusion cysts: none  Phenotype: Regular  -sites not commented on demonstrate normal findings.

## 2021-08-23 NOTE — Unmapped (Signed)
Patient scheduled for 08/23/2021 at 11:00 am with Dr Janyth Contes in [facility]    Thanks!

## 2021-08-24 MED ADMIN — triamcinolone acetonide (KENALOG-40) injection 40 mg: 40 mg | @ 03:00:00 | Stop: 2021-08-23

## 2021-08-24 NOTE — Unmapped (Signed)
You are welcome to join the [facility] for HS of the [employer] group online at [email or web address] or in-person at our regular meetings.  You can textHS to [phone] for meeting reminders or join the group online for regular updates.  This can be a great opportunity to interact and learn from other patients and help work with the HS community.  We hope to see your there!    Hidradentis Suppurativa (pronounced ???high-drad-en-eye-tis/sup-your-uh-tee-vah???) is a chronic disease of hair follicles.  The lesions occur most commonly on areas of skin-to-skin contact: under the arms (axillary area), in the groin, around the buttocks, in the region around the anus and genitals, and on the skin between and under the breasts. In women, the underarms, groin, and breast areas are most commonly affected. Men most often have HS lesions around the anus and under the arms and may also have HS at the back of the neck and behind and around the ears.    What does HS look and feel like?   The first thing that someone with HS notices is a tender, raised, red bump that looks like an under-the-skin pimple or boil. Sometimes HS lesions have two or more ???heads.???  In mild disease only an occasional boil or abscess may occur, but in more active disease there can be many new lesions every month.  Some abscesses can become larger and may open and drain pus.  Bleeding and increased odor can also occur. In severe disease, deeper abscesses develop and may connect with each other under the skin to form tunnel-like tracts (sinuses, fistulas).  These may drain constantly, or may temporarily improve and then usually begin draining again over time.  In people who have had sinus tracts for some time, scars form that feel like ropes under the skin. In the very worst cases, networks of sinus tracts can form deeper in the body, including the muscle and other tissues. Many people with severe HS have scars that can limit their ability to freely move their arms or legs, though this is very unlikely for most patients.     Clinicians usually classify or ???grade??? HS using the [facility] staging system according to the severity of the disease for each body location:  ??? Hurley stage I: one or more abscesses are present, but no sinus tracts have formed and no scars have developed  ??? Hurley stage II: one or more abscesses are present that resolve and recur; on sinus tract can be present and scarring is seen  ??? Hurley stage III: many abscesses and more than one sinus tract is present with extensive scars.    What causes HS?  The cause of HS is not completely understood.  It seems to be a disorder of hair follicles and often many family members are affected so genetics probably play a strong role.  Bacteria are often present and may make the disease worse, but infection does not seem to be the main cause. Hormones are also likely play a role since the condition typically starts around puberty when hair follicles under the arms and in the groin start to change.  It can sometimes flare with menstrual cycles in women as well.  In most cases it lasts for decades and starts to improve to some extent in the late 30s and 40s as long as many fistulas have not already formed.  Women are three times more likely than men to develop HS.    Other factors are known to contribute to HS  flaring or becoming worse, though they are likely not the main causes. The factors most commonly associated with HS include:  ??? Cigarette smoking - this is very highly linked.  Stopping smoking will likely not cure the disease, but likely is helpful in reducing how much and how often it flares.  ??? Obesity - HS may occur even in people that are not overweight, but it is much more common in patients that are.  There is some evidence that losing weight and eating a diet low in sugars and fats may be helpful in improving hidradenitis, though this is not helpful for everyone.  Working with a nutritionist may be an important way to help with this and is something your physician can help coordinate    Hidradenitis is not contagious.  It is not caused by a problem with personal hygiene or any other activity or behavior of those with the disease.    How can your doctor help you treat your hidradenitis?  Clinicians use both medication and surgery to treat HS. The choice of treatment???or combination of treatments???is made according to an individual patient???s needs. Clinicians consider several factors in determining the most appropriate plan for therapy:  ??? Severity of disease - medications and some laser treatments are usually able to control disease best when fistulas are not present.  Fistulas typically require surgery.  ??? Extent and location of disease  ??? Chronicity (how often the lesions recur)    A number of different surgical methods have been developed that are useful for certain patients under particular circumstances. These can be done with local numbing and healing at home for some areas when disease is not too extensive with relatively brief recovery times.  In more extensive disease there may be a need for larger excisions under general anesthesia with healing time in the hospital and prolonged recovery periods for better disease control.      In addition, many medical treatments have been tried???some with more success than others. No medication is effective for all patients, and you and your doctor may have to try several different agents or combinations of agents before you find the treatment plan that works best for you.  The goals of therapy with medications that are either topical (used on the skin) or systemic (taken by mouth) are:  1. to clear the lesions or at least reduce their number and extent, and  2. to prevent new lesions from forming.  3. To reduce pain, drainage, and odor  Some of the types of medications commonly used are antibacterial skin washes and the topical antibiotics to prevent secondary infections and corticosteroid injections into the lesions to reduce inflammation.     Other medications that may be used include retinoids (similar to Accutane), drugs that effect how hormones and hair follicles interact, drugs that affect your immune system (such as methotrexate, adalimumab/Humira, and Remicaid/infliximab), steroids, and oral antibiotics.    Lasers that destroy hair follicles can also be helpful since they reduce the hair follicles that cause the problems.  Multiple treatments are typically required over time and there is some discomfort associated with treatment, but it is typically very fast and well-tolerated.    It is very important to realize that hidradenitis cannot be completely cured with any single medication or surgical procedure.  It is a disease that can be very stubborn and difficult to control, but with good treatment a lot of improvement and sometimes temporary remissions can be obtained. Poorly controlled disease can cause  more fistulas to form and make managing the disease much more difficult over time so it is important to seek care to reduce major flares.  Surgery can provide a long term cure in some areas, though the disease can start again or continue in nearby areas.  A dermatologist is often the best person to help coordinate disease treatment, and sometimes other surgeons, pain specialists, other specialists, and nutritionists may be part of the treatment team.    What can you do to help your HS?  1. If your are a smoker, then stopping can probably be helpful.  Your dermatologist will be happy to refer you to some one who can help with this.  2. Follow a healthy diet and try to achieve a healthy weight  Some other self-help measures are:  ??? Keep your skin cool and dry (becoming overheated and sweating can contribute to an HS flare)  ??? To reduce the pain of cysts or nodules or to help them to drain, apply hot compresses or soak in hot water for 10 minutes at a time (use a clean washcloth or a teabag soaked in hot water)  ??? For female patients, cotton underwear that does not have tight elastic in the groin can be helpful.  Boyshort, brief, or boxer style underwear may be a better option as friction on hair follicles in affected areas can be a major trigger in some patients.  These can be easily found on [place] or with some retailers.  Fruit of the Loom and Underworks are two brands that are sometimes recommended.    Finally, know that you are not alone. Coping with the pain and other symptoms of HS can be very difficult, so it may be helpful to connect with others who live with HS. Patient groups and networks can be sources of important information and support. Some internet resources for information and connections are provided below.  Resources for Information    The Hidradenitis Suppurativa Foundation: A nonprofit organized by a group of physicians interested in treating and advancing research in hidradenitis suppurativa    American Academy of Dermatology  ARanked.fi    [employer]  ElevatorPitchers.de.html  NORD: IT trainer for [employer]  https://www.rarediseases.org/rare-disease-information/rare-diseases/byID/358/viewAbstract  Trials of new medications for HS  Https://www.clinicaltrials.gov

## 2021-09-12 NOTE — Unmapped (Signed)
[facility] Clinical Assessment & Refill Coordination Note    Wendy Chen, [place]: [birth year]  Phone: [phone] (home)     All above HIPAA information was verified with patient.     Was a Nurse, learning disability used for this call? No    Specialty Medication(s):   Inflammatory Disorders: Humira     Current Outpatient Medications   Medication Sig Dispense Refill   ??? HUMIRA PEN CITRATE FREE STARTER PACK FOR CROHN'S/UC/HS 3 X 80 MG/0.8 ML Inject the contents of 2 pens (160mg ) under the skin on day 1, THEN inject 1 pen (80mg ) on day 15. 3 each 0   ??? ADALIMUMAB PEN CITRATE FREE 40 MG/0.4 ML Inject the contents of 1 pen (40mg ) under the skin weekly as maintenance. 4 each 11   ??? ascorbic acid, vitamin C, (VITAMIN C) 500 MG tablet Take 1 tablet (500 mg total) by mouth daily. 14 tablet 0   ??? chlorthalidone (HYGROTON) 25 MG tablet Take 1 tablet (25 mg total) by mouth every morning. 90 tablet 3   ??? empty container Misc Use as directed to dispose of Humira pens. 1 each 2   ??? gabapentin (NEURONTIN) 100 MG capsule Take 1 capsule (100 mg total) by mouth Three (3) times a day for 5 days. 15 capsule 0   ??? ibuprofen (MOTRIN) 800 MG tablet Take 1 tablet 3 or 4 times a day as needed for pain and inflammation. Take with food to prevent stomach problems. 60 tablet 0     No current facility-administered medications for this visit.        Changes to medications: Wendy Chen reports no changes at this time.    No Known Allergies    Changes to allergies: No    SPECIALTY MEDICATION ADHERENCE     Humira 40 mg/0.64ml: 4 days of medicine on hand       Medication Adherence    Patient reported X missed doses in the last month: 0  Specialty Medication: Humira          Specialty medication(s) dose(s) confirmed: Regimen is correct and unchanged.     Are there any concerns with adherence? No    Adherence counseling provided? Not needed    CLINICAL MANAGEMENT AND INTERVENTION      Clinical Benefit Assessment:    Do you feel the medicine is effective or helping your condition? Yes    Clinical Benefit counseling provided? Not needed    Adverse Effects Assessment:    Are you experiencing any side effects? No    Are you experiencing difficulty administering your medicine? No    Quality of Life Assessment:    Quality of Life        Dermatology  1. What impact has your specialty medication had on the symptoms of your skin condition (i.e. itchiness, soreness, stinging)?: Some  2. What impact has your specialty medication had on your comfort level with your skin?: Minimal                 Have you discussed this with your provider? Not needed    Acute Infection Status:    Acute infections noted within Epic:  No active infections  Patient reported infection: None    Therapy Appropriateness:    Is therapy appropriate and patient progressing towards therapeutic goals? Yes, therapy is appropriate and should be continued    DISEASE/MEDICATION-SPECIFIC INFORMATION      For patients on injectable medications: Patient currently has 1 doses left.  Next injection is scheduled for 10/21.    PATIENT SPECIFIC NEEDS     - Does the patient have any physical, cognitive, or cultural barriers? No    - Is the patient high risk? No    - Does the patient require a Care Management Plan? No     - Does the patient require physician intervention or other additional services (i.e. nutrition, smoking cessation, social work)? No      SHIPPING     Specialty Medication(s) to be Shipped:   Inflammatory Disorders: Humira    Other medication(s) to be shipped: chlorthalidone     Changes to insurance: No    Delivery Scheduled: Yes, Expected medication delivery date: 10/21.     Medication will be delivered via Same Day Courier to the confirmed prescription address in [facility].    The patient will receive a drug information handout for each medication shipped and additional FDA Medication Guides as required.  Verified that patient has previously received a Conservation officer, historic buildings and a Surveyor, mining.    The patient or caregiver noted above participated in the development of this care plan and knows that they can request review of or adjustments to the care plan at any time.      All of the patient's questions and concerns have been addressed.    Clydell Hakim   [facility] Specialty Pharmacist

## 2021-09-16 MED FILL — HUMIRA PEN CITRATE FREE 40 MG/0.4 ML: 28 days supply | Qty: 4 | Fill #5

## 2021-10-18 NOTE — Unmapped (Signed)
[facility] Refill Coordination Note    Specialty Medication(s) to be Shipped:   Inflammatory Disorders: Humira    Other medication(s) to be shipped: No additional medications requested for fill at this time     Wendy Chen, DOB: 1981/06/30  Phone: [phone] (home)       All above HIPAA information was verified with patient.     Was a Nurse, learning disability used for this call? No    Completed refill call assessment today to schedule patient's medication shipment from the [facility] [phone]).  All relevant notes have been reviewed.     Specialty medication(s) and dose(s) confirmed: Regimen is correct and unchanged.   Changes to medications: Wendy Chen reports no changes at this time.  Changes to insurance: No  New side effects reported not previously addressed with a pharmacist or physician: None reported  Questions for the pharmacist: No    Confirmed patient received a Conservation officer, historic buildings and a Surveyor, mining with first shipment. The patient will receive a drug information handout for each medication shipped and additional FDA Medication Guides as required.       DISEASE/MEDICATION-SPECIFIC INFORMATION        For patients on injectable medications: Patient currently has 0 doses left.  Next injection is scheduled for 10/21/2021.    SPECIALTY MEDICATION ADHERENCE     Medication Adherence    Patient reported X missed doses in the last month: 0  Specialty Medication: HUMIRA(CF) PEN 40 mg/0.4 mL  Patient is on additional specialty medications: No  Any gaps in refill history greater than 2 weeks in the last 3 months: no  Demonstrates understanding of importance of adherence: yes  Informant: patient  Reliability of informant: reliable  Confirmed plan for next specialty medication refill: delivery by pharmacy  Refills needed for supportive medications: not needed              Were doses missed due to medication being on hold? No    Humira CF 40/0.4 mg/ml: 0 days of medicine on hand         REFERRAL TO PHARMACIST     Referral to the pharmacist: Not needed      [facility]     Shipping address confirmed in Epic.     Delivery Scheduled: Yes, Expected medication delivery date: 10/19/2021.     Medication will be delivered via Same Day Courier to the prescription address in Epic WAM.    Wendy Chen   [facility] Shared [facility] Specialty Technician

## 2021-10-19 MED FILL — HUMIRA PEN CITRATE FREE 40 MG/0.4 ML: 28 days supply | Qty: 4 | Fill #6

## 2021-11-10 NOTE — Unmapped (Signed)
[facility] Refill Coordination Note    Specialty Medication(s) to be Shipped:   Inflammatory Disorders: Humira    Other medication(s) to be shipped: amoxicillin-clavulanate 875-125mg      Wendy Chen, DOB: 02/16/1981  Phone: [phone] (home)       All above HIPAA information was verified with patient.     Was a Nurse, learning disability used for this call? No    Completed refill call assessment today to schedule patient's medication shipment from the [facility] [phone]).  All relevant notes have been reviewed.     Specialty medication(s) and dose(s) confirmed: Regimen is correct and unchanged.   Changes to medications: Kora reports no changes at this time.  Changes to insurance: No  New side effects reported not previously addressed with a pharmacist or physician: None reported  Questions for the pharmacist: No    Confirmed patient received a Conservation officer, historic buildings and a Surveyor, mining with first shipment. The patient will receive a drug information handout for each medication shipped and additional FDA Medication Guides as required.       DISEASE/MEDICATION-SPECIFIC INFORMATION        For patients on injectable medications: Patient currently has 1 doses left.  Next injection is scheduled for 12/16.    SPECIALTY MEDICATION ADHERENCE     Medication Adherence    Patient reported X missed doses in the last month: 0  Specialty Medication: Humira 40mg /0.42ml  Patient is on additional specialty medications: No  Patient is on more than two specialty medications: No  Any gaps in refill history greater than 2 weeks in the last 3 months: no  Demonstrates understanding of importance of adherence: yes  Informant: patient  Reliability of informant: reliable  Provider-estimated medication adherence level: good  Patient is at risk for Non-Adherence: No  Reasons for non-adherence: no problems identified  Confirmed plan for next specialty medication refill: delivery by pharmacy  Refills needed for supportive medications: not needed          Refill Coordination    Has the Patients' Contact Information Changed: No  Is the Shipping Address Different: No         Were doses missed due to medication being on hold? No      Humira 40/0.4 mg/ml: 7 days of medicine on hand     REFERRAL TO PHARMACIST     Referral to the pharmacist: Not needed      [facility]     Shipping address confirmed in Epic.     Delivery Scheduled: Yes, Expected medication delivery date: 12/20.     Medication will be delivered via Same Day Courier to the prescription address in Epic WAM.    Antonietta Barcelona   [facility] Specialty Technician

## 2021-11-15 MED FILL — HUMIRA PEN CITRATE FREE 40 MG/0.4 ML: 28 days supply | Qty: 4 | Fill #7

## 2021-11-15 MED FILL — AMOXICILLIN 875 MG-POTASSIUM CLAVULANATE 125 MG TABLET: ORAL | 14 days supply | Qty: 28 | Fill #2

## 2021-11-29 ENCOUNTER — Ambulatory Visit
Admit: 2021-11-29 | Discharge: 2021-11-30 | Payer: PRIVATE HEALTH INSURANCE | Attending: Student in an Organized Health Care Education/Training Program | Primary: Student in an Organized Health Care Education/Training Program

## 2021-11-29 LAB — BASIC METABOLIC PANEL
ANION GAP: 11 mmol/L (ref 5–14)
BLOOD UREA NITROGEN: 12 mg/dL (ref 9–23)
BUN / CREAT RATIO: 16
CALCIUM: 9.1 mg/dL (ref 8.7–10.4)
CHLORIDE: 99 mmol/L (ref 98–107)
CO2: 30 mmol/L (ref 20.0–31.0)
CREATININE: 0.73 mg/dL
EGFR CKD-EPI (2021) FEMALE: >90 mL/min/{1.73_m2} (ref >=60)
GLUCOSE RANDOM: 83 mg/dL (ref 70–179)
POTASSIUM: 2.8 mmol/L — ABNORMAL LOW (ref 3.4–4.8)
SODIUM: 140 mmol/L (ref 135–145)

## 2021-11-30 LAB — SYPHILIS SCREEN: SYPHILIS RPR SCREEN: NONREACTIVE

## 2021-11-30 NOTE — Unmapped (Signed)
I was available during Dr. Garner Nash' clinic session.  I agree with the assessment and plan as documented in her note. Ulla Gallo, MD

## 2021-11-30 NOTE — Unmapped (Signed)
Currently above goal on chlorthalidone 25mg  daily.  No changes today.  F/u BMP and ABPM.

## 2021-11-30 NOTE — Unmapped (Signed)
Addended by: Fayne Mediate on: 11/30/2021 08:11 AM     Modules accepted: Orders

## 2021-11-30 NOTE — Unmapped (Signed)
[facility]  Established Patient Clinic Note    Assessment/Plan:     Problem List Items Addressed This Visit        Cardiovascular and Mediastinum    Essential hypertension (Chronic)     Currently above goal on chlorthalidone 25mg  daily.  No changes today.  F/u BMP and ABPM.         Relevant Orders    Basic Metabolic Panel       Musculoskeletal and Integument    Hidradenitis suppurativa (Chronic)     Has b/l axillary flare today that pt states is worsening.  Messaged dermatologist to discuss next steps vs urgent appt.            Other    LLQ pain     Spasmodic, intense LLQ pain that comes and goes.  Of note, pt did not initially mention this to me, but as she was positioning herself for the pap smear, she had an intense episode of pain, causing her to cry.  No obvious finding on PE.  Pt states it feels crampy and is positional.  Concerned for hernia, though cannot appreciate anything palpable on exam (2/2 habitus).  Will order CT A/P.  If negative, consider gas vs constipation vs diverticulitis (though not having any changes in stool).         Relevant Orders    CT Abdomen Pelvis W Contrast   Other Visit Diagnoses     Healthcare maintenance    -  Primary    Relevant Orders    Syphilis Screen    HIV Antigen/Antibody Combo    Pap Smear    Screening for STD (sexually transmitted disease)        Relevant Orders    Chlamydia/Gonorrhoeae NAA    Need for immunization against influenza        Relevant Orders    INFLUENZA VACCINE (QUAD) IM - 6 MO-ADULT - PF (Completed)        Attending: Dr. Rogelia Boga    Subjective   Wendy Chen is a 41 y.o. female  coming to clinic today for the following issues:    Chief Complaint   Patient presents with   ??? Gynecology Problem   ??? Gynecologic Exam     HPI:    #htn  -hasn't been taking pressures at home    #hs  -having b/l axillary flare    #abd pain  -had an episode on the table  -llq  -comes and goes  -exacerbated by movement  -feels cramping  -lasts just a few seconds to minutes and then goes away by itself    ROS as above in HPI, otherwise remaining ROS negative.    I have reviewed the problem list, medications, and allergies and have updated/reconciled them if needed.    Wendy Chen  reports that she has never smoked. She has never used smokeless tobacco.  Health Maintenance   Topic Date Due   ??? Pap Smear (21-65)  10/12/2019   ??? COVID-19 Vaccine (3 - Pfizer risk series) 04/12/2020   ??? Serum Creatinine Monitoring  08/22/2022   ??? Potassium Monitoring  08/22/2022   ??? Lipid Screening  01/01/2025   ??? DTaP/Tdap/Td Vaccines (4 - Td or Tdap) 02/08/2027   ??? Hepatitis C Screen  Completed   ??? Influenza Vaccine  Completed   ??? Pneumococcal Vaccine 0-64  Aged Out       Objective     VITALS: BP 142/95  - Pulse  86  - Temp 36.6 ??C (97.8 ??F) (Temporal)  - Wt (!) 153 kg (337 lb 3.2 oz)  - LMP 11/09/2021  - BMI 52.81 kg/m??     Physical Exam   Gen: NAD, pleasant and conversant  HEENT: sclera anicteric, normocephalic/atraumatic  Resp: NWOB  CV: normotensive  Abd: no visible distention   MSK: FROMx4  Skin: warm, dry, well-perfused  Extrems: symmetric  Neuro: AAOx3, no gross focal deficits    LABS/IMAGING  I have reviewed pertinent recent labs and imaging in Epic    [facility]  [place] [facility]  [address] [place], [place] [address] ??? Telephone [phone] ??? Fax [phone]  [email or web address]

## 2021-11-30 NOTE — Unmapped (Signed)
Has b/l axillary flare today that pt states is worsening.  Messaged dermatologist to discuss next steps vs urgent appt.

## 2021-11-30 NOTE — Unmapped (Signed)
Spasmodic, intense LLQ pain that comes and goes.  Of note, pt did not initially mention this to me, but as she was positioning herself for the pap smear, she had an intense episode of pain, causing her to cry.  No obvious finding on PE.  Pt states it feels crampy and is positional.  Concerned for hernia, though cannot appreciate anything palpable on exam (2/2 habitus).  Will order CT A/P.  If negative, consider gas vs constipation vs diverticulitis (though not having any changes in stool).

## 2021-12-01 LAB — HIV ANTIGEN/ANTIBODY COMBO: HIV ANTIGEN/ANTIBODY COMBO: NONREACTIVE

## 2021-12-05 ENCOUNTER — Ambulatory Visit
Admit: 2021-12-05 | Discharge: 2021-12-06 | Payer: PRIVATE HEALTH INSURANCE | Attending: Student in an Organized Health Care Education/Training Program | Primary: Student in an Organized Health Care Education/Training Program

## 2021-12-05 DIAGNOSIS — L732 Hidradenitis suppurativa: Principal | ICD-10-CM

## 2021-12-05 DIAGNOSIS — L0291 Cutaneous abscess, unspecified: Principal | ICD-10-CM

## 2021-12-05 MED ORDER — PREDNISONE 10 MG TABLET
ORAL_TABLET | ORAL | 0 refills | 24.00000 days | Status: CP
Start: 2021-12-05 — End: 2021-12-29
  Filled 2021-12-06: qty 84, 24d supply, fill #0

## 2021-12-05 NOTE — Unmapped (Signed)
Today your resident physician was: Dr. Jaishaun Mcnab

## 2021-12-05 NOTE — Unmapped (Signed)
Dermatology Note     Assessment and Plan:      Hidradenitis Suppurativa, flaring of bilateral axillae- Hurley Stage 3- L > R  - Previously tried topicals as well as Cefdinir, metformin, spironolactone as well as ILK with no improvement   - Currently on Humira 80 mg weekly as this has been helpful over the last year,  Discussed risks of tuberculosis, other uncommon infections, theoretical risk of malignancies, and other uncommon side effects.  - Discussed the chronic, relapsing nature of this skin disease, characterized by recurring inflamed painful nodules with abscess and sinus formation, and scarring  - Given severity of disease and recalcitrance to treatment, start Infliximab 10 mg/kg, information sent to [facility], therapy plan ordered  - Discussed option of prednisone taper due to acute flare. Pt agrees; sent in Rx  - Will proceed with I&D of L axilla, see procedure note below  - predniSONE (DELTASONE) 10 MG tablet; Take 6 tablets (60 mg total) by mouth daily for 4 days, THEN 5 tablets (50 mg total) daily for 4 days, THEN 4 tablets (40 mg total) daily for 4 days, THEN 3 tablets (30 mg total) daily for 4 days, THEN 2 tablets (20 mg total) daily for 4 days, THEN 1 tablet (10 mg total) daily for 4 days.  Dispense: 84 tablet; Refill: 0    - Punch I&D procedure note: After application of ice pack followed by alcohol prep, the L axilla area was anesthetized with 1% lidocaine with epinephrine prior to incised using a 4 mm punch tool, which yielded a copious amount of purulent drainage (drainaged assisted with pressure/manipulation and loculation disruption with scissors as needed)    High Risk Medication Use (Humira/Remicade)   - Hepatitis panels and quantiferon gold normal 02/2021    The patient was advised to call for an appointment should any new, changing, or symptomatic lesions develop.     RTC: Return if symptoms worsen or fail to improve, for HS- Sayed.  _________________________________________________________________    Chief Complaint     Chief Complaint   Patient presents with   ??? Lesion Of Concern     Boil under arm-hs-  New one under right arm-2 wks     HPI     Wendy Chen is a 41 y.o. female who presents as a returning patient (last seen 08/23/2021) to [facility] for follow-up of hidradenitis suppurativa.     - Lesion under R arm, new for the last several days, painful, would like some help with managing it. She is currently on amoxicillin and Humira with no improvement     The patient denies any other new or changing lesions or areas of concern.     Pertinent Past Medical History     No history of skin cancer    Disease course:  Year when symptoms first noticed: 10  Year of diagnosis: 2019  Who diagnosed you? Dermatologist  Location of first symptoms: groin, axillae and inner thighs  Typical involved areas include: groin, axillae, inner thighs and buttocks  Typical number of inflammatory lesions each month at baseline (from first visit): 1-3  Disease triggers: stress, sweat and exercise    Are menstrual cycles irregular when not on birth control? Yes.  Current form of contraception: implant  Effect of hormonal contraception on disease: no change  Difficulty becoming pregnancy? No.  Pregnancy complications? early delivery, pre-eclampsia and c-section  How many children? 3  Better or worse with pregnancy?  no change.  If so, worse  during a specific trimester? No change    Family History:   Reviewed in Epic     Past Medical History, Family History, Social History, Medication List, Allergies, and Problem List were reviewed in the rooming section of Epic.     ROS: Other than symptoms mentioned in the HPI, no fevers, chills, or other skin complaints    Physical Examination     Gen: Well-appearing patient, appropriate, interactive, in no acute distress  Skin: Examination of the scalp, face, neck, chest, back, abdomen, bilateral upper and lower extremities, hands, palms, soles, nails, buttocks, and external genitalia performed today and pertinent for:     location Abscess Inflamed nodule Non-inflamed nodule Draining sinus Non-draining Sinus Hurley BSA at site Color change  Inflamed induration Open skin surface  Tunnels   R axilla 1 1    3         L axilla  1    3        R inframammary              L inframammary              Intermammary              Pubic              R inguinal              R thigh              L inguinal              L thigh              Scrotum/Vulva              Perianal              R buttock              L buttock              Other (list)                            *0=none, 1=mild, 2=moderate, 3=severe    AN count (total sum of abscess and inflammatory nodule): 3  Pilonidal sinus (Y/N, or previously treated)? No.  Approximate BSA involved by inflammatory lesions: 0.2  Intertriginous comedones: none  Diffuse comedones (trunk, face, etc): none  Acne scars: none  Cribriform scarring: No.  Intertrigionus epidermal inclusion cysts: none  Diffuse (trunk, feace, extremities) epidermal inclusion cysts: none  Phenotype: Regular  -sites not commented on demonstrate normal findings.

## 2021-12-07 ENCOUNTER — Ambulatory Visit: Admit: 2021-12-07 | Discharge: 2021-12-08 | Payer: PRIVATE HEALTH INSURANCE

## 2021-12-07 MED ADMIN — iohexoL (OMNIPAQUE) 350 mg iodine/mL solution 100 mL: 100 mL | INTRAVENOUS | @ 16:00:00 | Stop: 2021-12-07

## 2021-12-07 NOTE — Unmapped (Signed)
Addended by: Fayne Mediate on: 12/07/2021 11:21 AM     Modules accepted: Orders

## 2021-12-09 NOTE — Unmapped (Signed)
[facility] Refill Coordination Note    Specialty Medication(s) to be Shipped:   Inflammatory Disorders: Humira    Other medication(s) to be shipped: amoxicillin-clavulanate 875-125mg  and chlorthalidone      Wendy Chen, DOB: November 18, 1981  Phone: [phone] (home)       All above HIPAA information was verified with patient.     Was a Nurse, learning disability used for this call? No    Completed refill call assessment today to schedule patient's medication shipment from the [facility] [phone]).  All relevant notes have been reviewed.     Specialty medication(s) and dose(s) confirmed: Regimen is correct and unchanged.   Changes to medications: Masayo reports no changes at this time.  Changes to insurance: No  New side effects reported not previously addressed with a pharmacist or physician: None reported  Questions for the pharmacist: Yes: Patient just wants to check if Prednisone interacts with any of her other medications. Transferred to [facility]    Confirmed patient received a Conservation officer, historic buildings and a Surveyor, mining with first shipment. The patient will receive a drug information handout for each medication shipped and additional FDA Medication Guides as required.       DISEASE/MEDICATION-SPECIFIC INFORMATION        For patients on injectable medications: Patient currently has 1 doses left.  Next injection is scheduled for 12/09/2021.    SPECIALTY MEDICATION ADHERENCE     Medication Adherence    Patient reported X missed doses in the last month: 0  Specialty Medication: HUMIRA(CF) PEN 40 mg/0.4 mL  Patient is on additional specialty medications: No  Any gaps in refill history greater than 2 weeks in the last 3 months: no  Demonstrates understanding of importance of adherence: yes  Informant: patient  Reliability of informant: reliable  Confirmed plan for next specialty medication refill: delivery by pharmacy  Refills needed for supportive medications: not needed              Were doses missed due to medication being on hold? No      Humira 40/0.4 mg/ml: 7 days of medicine on hand     REFERRAL TO PHARMACIST     Referral to the pharmacist: Not needed      [facility]     Shipping address confirmed in Epic.     Delivery Scheduled: Yes, Expected medication delivery date: 12/14/2021.     Medication will be delivered via Same Day Courier to the prescription address in Epic WAM.    Alexsandro Salek D Kofi Murrell   [facility] Specialty Technician

## 2021-12-14 MED FILL — CHLORTHALIDONE 25 MG TABLET: ORAL | 90 days supply | Qty: 90 | Fill #1

## 2021-12-14 MED FILL — HUMIRA PEN CITRATE FREE 40 MG/0.4 ML: 28 days supply | Qty: 4 | Fill #8

## 2021-12-14 MED FILL — AMOXICILLIN 875 MG-POTASSIUM CLAVULANATE 125 MG TABLET: ORAL | 14 days supply | Qty: 28 | Fill #3

## 2021-12-15 ENCOUNTER — Ambulatory Visit: Admit: 2021-12-15 | Discharge: 2021-12-16 | Payer: PRIVATE HEALTH INSURANCE

## 2021-12-15 LAB — BASIC METABOLIC PANEL
ANION GAP: 12 mmol/L (ref 5–14)
BLOOD UREA NITROGEN: 20 mg/dL (ref 9–23)
BUN / CREAT RATIO: 26
CALCIUM: 9.9 mg/dL (ref 8.7–10.4)
CHLORIDE: 101 mmol/L (ref 98–107)
CO2: 26 mmol/L (ref 20.0–31.0)
CREATININE: 0.78 mg/dL
EGFR CKD-EPI (2021) FEMALE: >90 mL/min/{1.73_m2} (ref >=60)
GLUCOSE RANDOM: 99 mg/dL (ref 70–179)
POTASSIUM: 3.6 mmol/L (ref 3.4–4.8)
SODIUM: 139 mmol/L (ref 135–145)

## 2021-12-30 NOTE — Unmapped (Signed)
[facility] Refill Coordination Note    Specialty Medication(s) to be Shipped:   Inflammatory Disorders: Humira    Other medication(s) to be shipped: No additional medications requested for fill at this time     Wendy Chen, DOB: Dec 03, 1980  Phone: [phone] (home)       All above HIPAA information was verified with patient.     Was a Nurse, learning disability used for this call? No    Completed refill call assessment today to schedule patient's medication shipment from the [facility] [phone]).  All relevant notes have been reviewed.     Specialty medication(s) and dose(s) confirmed: Regimen is correct and unchanged.   Changes to medications: Lear reports no changes at this time.  Changes to insurance: No  New side effects reported not previously addressed with a pharmacist or physician: None reported  Questions for the pharmacist: No    Confirmed patient received a Conservation officer, historic buildings and a Surveyor, mining with first shipment. The patient will receive a drug information handout for each medication shipped and additional FDA Medication Guides as required.       DISEASE/MEDICATION-SPECIFIC INFORMATION        For patients on injectable medications: Patient currently has 2 doses left.  Next injection is scheduled for 02/03,02/10.    SPECIALTY MEDICATION ADHERENCE     Medication Adherence    Patient reported X missed doses in the last month: 0  Specialty Medication: humira 40mg /0.11ml  Patient is on additional specialty medications: No  Patient is on more than two specialty medications: No  Any gaps in refill history greater than 2 weeks in the last 3 months: no  Demonstrates understanding of importance of adherence: yes  Informant: patient  Reliability of informant: reliable  Provider-estimated medication adherence level: good  Patient is at risk for Non-Adherence: No  Reasons for non-adherence: no problems identified  Confirmed plan for next specialty medication refill: delivery by pharmacy  Refills needed for supportive medications: not needed          Refill Coordination    Has the Patients' Contact Information Changed: No  Is the Shipping Address Different: No         Were doses missed due to medication being on hold? No    Humira 40mg /0.4   mg/ml: 0 days of medicine on hand         REFERRAL TO PHARMACIST     Referral to the pharmacist: Not needed      [facility]     Shipping address confirmed in Epic.     Delivery Scheduled: Yes, Expected medication delivery date: 02/09.     Medication will be delivered via Same Day Courier to the prescription address in Epic WAM.    Antonietta Barcelona   [facility] Specialty Technician

## 2022-01-05 MED FILL — HUMIRA PEN CITRATE FREE 40 MG/0.4 ML: 28 days supply | Qty: 4 | Fill #9

## 2022-01-11 ENCOUNTER — Ambulatory Visit: Admit: 2022-01-11 | Discharge: 2022-01-12 | Payer: PRIVATE HEALTH INSURANCE | Attending: Surgery | Primary: Surgery

## 2022-01-11 NOTE — Unmapped (Signed)
GENERAL SURGERY OFFICE NOTE      ASSESSMENT/PLAN  Wendy Chen with large incisional hernia containing bowel, intermittently symptomatic, no obstructive symptoms. Long discussion held with pt about hernia repair risks/benefits. At her current weight (341 lbs, BMI 53.5), strongly advise that she lose weight PRIOR to elective hernia repair, as the risks and complications of surgery (ie infection--including mesh infection, recurrence) likely outweigh the benefits. Will refer to Dr. Paulla Dolly for medical weight loss; I have communicated with Dr. Loa Socks about pt this morning. Discussed signs/symptoms of hernia incarceration with pt and advised her to go to nearest ED if she develops these symptoms, as emergent hernia repair might be needed. If emergent hernia repair needed, the benefits of surgery would outweigh the risks at that time. Goal BMI of <35 discussed with her (ie weight loss of at least 120 lbs). Pt verbalizes understanding of this discussion.  Will also need to address Humira perioperatively as well.   All of her questions answered. She will plan to return after weight loss achieved.       Wendy Chen  January 11, 2022 8:49 AM      REFERRING PROVIDER:Jennifer Rogelia Boga MD    ZO:[id number]      HPI  Wendy Chen here at the request of Dr. Rogelia Boga for eval of a hernia.   Unclear onset of pain over past several months; her PCP noted that pt had LLQ pain during her Pap smear on 11/29/21  Pt describes focal pain at lower left abdomen  Describes pain as muscle spasm  Lasts up to 15 minutes, pretty debilitating; sneezing does seem to cause the pain  No N/V  Onset of constipation that has occurred with this pain; hernia area hurts worse when she's constipated  Takes Miralax, which helps  Unable to reproduce the pain, doesn't see/feel the bulge   Surg hx notable for C-sections x 3, last in 2018        PMH  Past Medical History:   Diagnosis Date   ??? AMA age 41 10/04/2016    We discussed the patient's age related risk of aneuploidy. ??We discussed screening options including serum screening and NIPT as well as diagnostic options of CVS and amniocentesis. ??We discussed risks, benefits and diagnostic capability of both CVS and amniocentesis.   -ordered genetic consult and first trimester screen  -Age 41 at [facility] -S/p genetic counseling 11/17 -Negative cfDNA -Fetal fraction 1.5% (Fetal fraction is affected by fetal aneuploidy (trisomy 13 and 18), maternal BMI, and other maternal conditions such as preexisting hypertension. In the setting of low fetal fraction (typically defined in the literature as <4%) sensitivity of cfDNA screening may be reduced Janyth Contes, 2015. Reprod Sci).     ??? Heart murmur     benign, no abx prophylaxis required, 2012 echo noted trivial TR   ??? Hidradenitis suppurativa--followed by Dermatology 2018   ??? Hypertension    ??? Migraine    ??? Obesity    ??? Postpartum depression 2007, 2012   ??? Procreative genetic counseling 10/05/2016    Genetic counseling visit on 10/09/16 Aneuploidy screening/ testing: cfDNA; results negative - low fetal fraction Carrier screening: [x]  Cystic fibrosis - neg x 106 mutationss [x]  Spinal muscular atrophy - 3 copies; reduced carrier risk [x]  Hemoglobinopathy screening - normal adult hemoglobin present     ??? Sleep apnea    ??? Snoring 12/08/2016         PSH  Past Surgical History:   Procedure Laterality Date   ??? CESAREAN SECTION  2007, 2012   ??? PR CESAREAN DELIVERY ONLY N/A 04/03/2017    Procedure: CESAREAN DELIVERY ONLY;  Surgeon: Barb Merino, MD;  Location: L&D C-SECTION OR SUITES [facility];  Service: Family Planning   ??? PR REVISE MEDIAN N/CARPAL TUNNEL SURG Right 03/02/2021    Procedure: NEUROPLASTY AND/OR TRANSPOSITION; MEDIAN NERVE AT CARPAL TUNNEL;  Surgeon: Daisy Lazar, MD;  Location: [facility] OR [facility];  Service: Orthopedics   ??? PR REVISE MEDIAN N/CARPAL TUNNEL SURG Left 03/16/2021    Procedure: NEUROPLASTY AND/OR TRANSPOSITION; MEDIAN NERVE AT CARPAL TUNNEL;  Surgeon: Theodora Blow Jacqlyn Krauss, MD;  Location: [facility] OR [facility];  Service: Orthopedics   ??? WISDOM TOOTH EXTRACTION     C-section x 3      MEDS    Current Outpatient Medications:   ???  ADALIMUMAB PEN CITRATE FREE 40 MG/0.4 ML, Inject the contents of 1 pen (40mg ) under the skin weekly as maintenance., Disp: 4 each, Rfl: 11  ???  ascorbic acid, vitamin C, (VITAMIN C) 500 MG tablet, Take 1 tablet (500 mg total) by mouth daily., Disp: 14 tablet, Rfl: 0  ???  chlorthalidone (HYGROTON) 25 MG tablet, Take 1 tablet (25 mg total) by mouth every morning., Disp: 90 tablet, Rfl: 3  ???  empty container Misc, Use as directed to dispose of Humira pens., Disp: 1 each, Rfl: 2  ???  gabapentin (NEURONTIN) 100 MG capsule, Take 1 capsule (100 mg total) by mouth Three (3) times a day for 5 days., Disp: 15 capsule, Rfl: 0  ???  HUMIRA PEN CITRATE FREE STARTER PACK FOR CROHN'S/UC/HS 3 X 80 MG/0.8 ML, Inject the contents of 2 pens (160mg ) under the skin on day 1, THEN inject 1 pen (80mg ) on day 15., Disp: 3 each, Rfl: 0  ???  ibuprofen (MOTRIN) 800 MG tablet, Take 1 tablet 3 or 4 times a day as needed for pain and inflammation. Take with food to prevent stomach problems., Disp: 60 tablet, Rfl: 0      ALLERGIES  No Known Allergies    SOCIAL HX  Social History     Socioeconomic History   ??? Marital status: Single   Tobacco Use   ??? Smoking status: Never   ??? Smokeless tobacco: Never   Substance and Sexual Activity   ??? Alcohol use: No     Alcohol/week: 0.0 standard drinks   ??? Drug use: No   Other Topics Concern   ??? Do you use sunscreen? No   ??? Tanning bed use? No   ??? Are you easily burned? No   ??? Excessive sun exposure? No   ??? Blistering sunburns? No     Social Determinants of Health     Financial Resource Strain: Medium Risk   ??? Difficulty of Paying Living Expenses: Somewhat hard   Food Insecurity: Food Insecurity Present   ??? Worried About Programme researcher, broadcasting/film/video in the Last Year: Sometimes true   ??? Ran Out of Food in the Last Year: Sometimes true   Transportation Needs: No Transportation Needs   ??? Lack of Transportation (Medical): No   ??? 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      FAMILY HX  Family History   Problem Relation Age of Onset   ??? Diabetes Mother    ??? Hypertension Mother    ??? Hypertension Father    ??? Diabetes Father    ??? Hypertension Sister    ??? Stroke  Brother    ??? Seizures Brother    ??? Heart attack Brother    ??? Hypertension Brother    ??? Cancer Maternal Uncle         bone, colon   ??? Alzheimer's disease Paternal Uncle    ??? Heart murmur Maternal Grandmother    ??? Diabetes Maternal Grandmother    ??? Diabetes Maternal Grandfather    ??? Heart murmur Maternal Grandfather    ??? Anesthesia problems Neg Hx    ??? Bleeding Disorder Neg Hx    ??? Melanoma Neg Hx    ??? Basal cell carcinoma Neg Hx    ??? Squamous cell carcinoma Neg Hx        ROS  As per HPI      BP 156/89 (BP Position: Sitting)  - Pulse 69  - Temp 35.9 ??C (96.7 ??F) (Temporal)  - Ht 170.2 cm (5' 7)  - Wt (!) 154.9 kg (341 lb 8 oz)  - SpO2 98%  - BMI 53.49 kg/m??   PHYS EXAM  Gen: awake, alert, NAD  HEENT: NCAT, no scleral icterus, pupils equal and round, no gross hearing deficitis, dentition not assessed due to mask  Neck: trachea midline, full range of motion  Resp:no tachypnea or dyspnea  GI: soft, nondistended, no hepatosplenomegaly or masses, well healed Pfannenstiel, nontender to palpation, including over the lower abdomen, vague fullness noted in the lower midline with Valsalva that seems reducible, unable to determine fascial defect or size of this fullness  MSK: no malalignment or asymmetry of extremities  Skin: no jaundice or rashes, no induration  Neuro: normal gait and station; cranial nerves, sensory, and motor function all grossly intact  Psych: normal mood and affect, good judgement and insight, oriented x 3    DIAGNOSTIC TESTS  Recent Results (from the past 672 hour(s))   Basic Metabolic Panel    Collection Time: 12/15/21  4:22 PM   Result Value Ref Range    Sodium 139 135 - 145 mmol/L    Potassium 3.6 3.4 - 4.8 mmol/L    Chloride 101 98 - 107 mmol/L    CO2 26.0 20.0 - 31.0 mmol/L    Anion Gap 12 5 - 14 mmol/L    BUN 20 9 - 23 mg/dL    Creatinine 1.61 0.96 - 0.80 mg/dL    BUN/Creatinine Ratio 26     eGFR CKD-EPI (2021) Female >90 >=60 mL/min/1.4m2    Glucose 99 70 - 179 mg/dL    Calcium 9.9 8.7 - 04.5 mg/dL   4/0/98: Hgb J1B: 4.7    CT Abdomen Pelvis W Contrast--images and report reviewed    Result Date: 12/07/2021  EXAM: CT ABDOMEN PELVIS W CONTRAST DATE: 12/07/2021 10:41 AM ACCESSION: [id number] UN DICTATED: 12/07/2021 10:51 AM INTERPRETATION LOCATION: [facility] CLINICAL INDICATION: 41 years old with LLQ pain, r/o hernia ; LLQ abdominal pain  - R10.32 - LLQ pain  COMPARISON: 01/04/2016 CT abdomen pelvis TECHNIQUE: A helical CT scan of the abdomen and pelvis was obtained following IV contrast from the lung bases through the pubic symphysis. Images were reconstructed in the axial plane. Coronal and sagittal reformatted images were also provided for further evaluation. FINDINGS: LOWER CHEST: Unremarkable. LIVER: Normal liver contour.  No focal liver lesions. BILIARY: No biliary ductal dilatation.  The gallbladder is normal in appearance. SPLEEN: Normal in size and contour. PANCREAS: Normal pancreatic contour.  No focal lesions.  No ductal dilation. ADRENAL GLANDS: Normal appearance of the adrenal glands. KIDNEYS/URETERS: Symmetric renal  enhancement.  No hydronephrosis.  BLADDER: Unremarkable. REPRODUCTIVE ORGANS: Unremarkable. GI TRACT: No findings of bowel obstruction or acute inflammation.  Normal appendix.A moderate to large size Infrarenal ventral abdominal wall hernia with a defect measuring 6.4 cm in the transverse plane and 8.3 cm on the craniocaudal plane. The hernia contains part of the ascending colon, cecum, ileal loops, terminal ileum and appendix. PERITONEUM, RETROPERITONEUM AND MESENTERY: No free air.  No ascites.  No fluid collection. LYMPH NODES: No adenopathy. VESSELS: Hepatic and portal veins are patent.  Normal caliber aorta with scattered a few calcifications. BONES and SOFT TISSUES: No aggressive osseous lesions.  No focal soft tissue lesions. Diastasis recti.     -- A moderate to large size Infrarenal ventral abdominal wall hernia containing part of the ascending colon, cecum, ileal loops, terminal ileum and appendix. No evidence of bowel obstruction or obvious inflammation or strangulation. The hernia is new compared to prior CT from 2017.

## 2022-01-11 NOTE — Unmapped (Signed)
Pt in today for mid lower abd bulge which causes spasm -like pain at times

## 2022-01-31 NOTE — Unmapped (Signed)
The [facility] has made a second and final attempt to reach this patient to refill the following medication:Humira.      We have left voicemails on the following phone numbers: [phone] (sent texts as well).    Dates contacted: 3/3 3/7  Last scheduled delivery: 2/9    The patient may be at risk of non-compliance with this medication. The patient should call the [facility] at [phone]  Option 4, then Option 2 (all other specialty patients) to refill medication.    Wendy Chen Administrator [facility] Specialty Technician

## 2022-02-02 NOTE — Unmapped (Signed)
[facility] Refill Coordination Note    Specialty Medication(s) to be Shipped:   Inflammatory Disorders: Humira    Other medication(s) to be shipped: No additional medications requested for fill at this time     Wendy Chen, DOB: 06-03-1981  Phone: [phone] (home)       All above HIPAA information was verified with patient.     Was a Nurse, learning disability used for this call? No    Completed refill call assessment today to schedule patient's medication shipment from the [facility] [phone]).  All relevant notes have been reviewed.     Specialty medication(s) and dose(s) confirmed: Regimen is correct and unchanged.   Changes to medications: [name] reports no changes at this time.  Changes to insurance: No  New side effects reported not previously addressed with a pharmacist or physician: None reported  Questions for the pharmacist: No    Confirmed patient received a Conservation officer, historic buildings and a Surveyor, mining with first shipment. The patient will receive a drug information handout for each medication shipped and additional FDA Medication Guides as required.       DISEASE/MEDICATION-SPECIFIC INFORMATION        For patients on injectable medications: Patient currently has 1 doses left.  Next injection is scheduled for 02/10/22.    SPECIALTY MEDICATION ADHERENCE     Medication Adherence    Patient reported X missed doses in the last month: 0  Specialty Medication: Humira  Patient is on additional specialty medications: No  Any gaps in refill history greater than 2 weeks in the last 3 months: no  Demonstrates understanding of importance of adherence: yes  Informant: patient  Reliability of informant: reliable  Confirmed plan for next specialty medication refill: delivery by pharmacy  Refills needed for supportive medications: not needed              Were doses missed due to medication being on hold? No      REFERRAL TO PHARMACIST     Referral to the pharmacist: Not needed      [facility]     Shipping address confirmed in Epic.     Delivery Scheduled: Yes, Expected medication delivery date: 02/06/22.     Medication will be delivered via Same Day Courier to the prescription address in Epic WAM.    Wendy Chen   [facility] Specialty Technician

## 2022-02-06 MED FILL — HUMIRA PEN CITRATE FREE 40 MG/0.4 ML: 28 days supply | Qty: 4 | Fill #10

## 2022-02-09 ENCOUNTER — Ambulatory Visit
Admit: 2022-02-09 | Discharge: 2022-02-10 | Payer: PRIVATE HEALTH INSURANCE | Attending: Family Medicine | Primary: Family Medicine

## 2022-02-09 DIAGNOSIS — E782 Mixed hyperlipidemia: Principal | ICD-10-CM

## 2022-02-09 DIAGNOSIS — I10 Essential (primary) hypertension: Principal | ICD-10-CM

## 2022-02-09 DIAGNOSIS — R1032 Left lower quadrant pain: Principal | ICD-10-CM

## 2022-02-09 DIAGNOSIS — K469 Unspecified abdominal hernia without obstruction or gangrene: Principal | ICD-10-CM

## 2022-02-09 DIAGNOSIS — K432 Incisional hernia without obstruction or gangrene: Principal | ICD-10-CM

## 2022-02-09 DIAGNOSIS — G4733 Obstructive sleep apnea (adult) (pediatric): Principal | ICD-10-CM

## 2022-02-09 LAB — CBC W/ AUTO DIFF
BASOPHILS ABSOLUTE COUNT: 0 10*9/L (ref 0.0–0.1)
BASOPHILS RELATIVE PERCENT: 0.6 %
EOSINOPHILS ABSOLUTE COUNT: 0.5 10*9/L (ref 0.0–0.5)
EOSINOPHILS RELATIVE PERCENT: 8.8 %
HEMATOCRIT: 38.1 % (ref 34.0–44.0)
HEMOGLOBIN: 12.5 g/dL (ref 11.3–14.9)
LYMPHOCYTES ABSOLUTE COUNT: 2.1 10*9/L (ref 1.1–3.6)
LYMPHOCYTES RELATIVE PERCENT: 38.6 %
MEAN CORPUSCULAR HEMOGLOBIN CONC: 32.9 g/dL (ref 32.0–36.0)
MEAN CORPUSCULAR HEMOGLOBIN: 27.5 pg (ref 25.9–32.4)
MEAN CORPUSCULAR VOLUME: 83.5 fL (ref 77.6–95.7)
MEAN PLATELET VOLUME: 8.5 fL (ref 6.8–10.7)
MONOCYTES ABSOLUTE COUNT: 0.3 10*9/L (ref 0.3–0.8)
MONOCYTES RELATIVE PERCENT: 5.7 %
NEUTROPHILS ABSOLUTE COUNT: 2.5 10*9/L (ref 1.8–7.8)
NEUTROPHILS RELATIVE PERCENT: 46.3 %
NUCLEATED RED BLOOD CELLS: 0 /100{WBCs} (ref <=4)
PLATELET COUNT: 326 10*9/L (ref 150–450)
RED BLOOD CELL COUNT: 4.57 10*12/L (ref 3.95–5.13)
RED CELL DISTRIBUTION WIDTH: 13.9 % (ref 12.2–15.2)
WBC ADJUSTED: 5.4 10*9/L (ref 3.6–11.2)

## 2022-02-09 LAB — HEPATIC FUNCTION PANEL
ALBUMIN: 3.6 g/dL (ref 3.4–5.0)
ALKALINE PHOSPHATASE: 66 U/L (ref 46–116)
ALT (SGPT): 16 U/L (ref 10–49)
AST (SGOT): 20 U/L (ref <=34)
BILIRUBIN DIRECT: <0.1 mg/dL (ref 0.00–0.30)
BILIRUBIN TOTAL: 0.4 mg/dL (ref 0.3–1.2)
PROTEIN TOTAL: 7.2 g/dL (ref 5.7–8.2)

## 2022-02-09 LAB — HEMOGLOBIN A1C
ESTIMATED AVERAGE GLUCOSE: 94 mg/dL
HEMOGLOBIN A1C: 4.9 % (ref 4.8–5.6)

## 2022-02-09 LAB — TSH: THYROID STIMULATING HORMONE: 1.053 u[IU]/mL (ref 0.550–4.780)

## 2022-02-09 MED ORDER — METFORMIN ER 500 MG TABLET,EXTENDED RELEASE 24 HR
ORAL_TABLET | Freq: Two times a day (BID) | ORAL | 3 refills | 60 days | Status: CP
Start: 2022-02-09 — End: ?
  Filled 2022-02-16: qty 120, 60d supply, fill #0

## 2022-02-09 MED ORDER — CHLORTHALIDONE 50 MG TABLET
ORAL_TABLET | Freq: Every day | ORAL | 3 refills | 90 days | Status: CP
Start: 2022-02-09 — End: ?
  Filled 2022-02-16: qty 90, 90d supply, fill #0

## 2022-02-09 NOTE — Unmapped (Signed)
Reviewed relevant medications and labs. Pt is following our [facility] Management Clinic. See Obesity tab for medication changes.     Lab Results   Component Value Date    CHOL 217 (H) 01/02/2020     Lab Results   Component Value Date    LDL Calculated 135 (H) 01/02/2020    HDL 57 01/02/2020    Triglycerides 124 01/02/2020     The 10-year ASCVD risk score (Arnett DK, et al., 2019) is: 7.6%

## 2022-02-09 NOTE — Unmapped (Signed)
Today's BP: 170/90 is NOT at goal. BP continues to be elevated on chlorthalidone 25 mg. We discussed and decided to increase chlorthalidone to 50 mg. Prescription sent. Pt verbalized understanding and agreement with plan.      Medication: INCREASE chlorthalidone 25 mg --> 50 mg once daily

## 2022-02-09 NOTE — Unmapped (Signed)
Has a CPAP but doesn't use it. We discussed consequences of untreated OSA. I encouraged to use CPAP machine and weight loss including bariatric surgery may improve OSA.

## 2022-02-09 NOTE — Unmapped (Addendum)
Wendy Chen is a 41 y.o. 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 Comorbidities include hypertension, incisional hernia, hyperlipidemia, OSA (not using CPAP). 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    Pt needs to lose 120 lbs or BMI <35 to decrease risk of surgery--hernia repair. Spoke to Dr. Orvan Falconer that this degree of wt loss is probably not possible with medical management especially with her insurance not covering the GLP1s. She will discuss with Dr. Augustine Radar about bariatric surgery while we work with pts on healthy lifestyle habits and trial of medications that are covered by her insurance.     Weight Summary:  1. Starting weight/BMI/WC/[place]: 338 lbs, BMI 52.93, WC 52, Walker 16.5 (02/09/2022)  2. Target weight/goal: No goal weight. Patient wants general improvement in health and appearance.  Today's weight/BMI: (!) 153.3 kg (338 lb), BMI (!) 52.93 (02/09/2022)  3. % Body weight loss: N/A  Today's Waist Circumference: 52 inches (02/09/2022)  4. Today's visit number: 1st  5. Duration in program: NA    Referred by: [name], MD (Surgery)    GOALS: 1) Cut out apple juice, cherry coke, sweet tea and mocha from [employer], switch to diet or water  2) Change breakfast to greek yogurt with fruit & nuts  3) Restart metformin    I have reviewed the patient's medical history, lifestyle history and labs/tests.   My recommendations include the following:    Eating Pattern: Lots of room for improvement in eating pattern. Pt is drinking 3-4 SSBs daily including apple juice, cherry coke, and sweet tea.   -- Discussed about healthier food choices and advised to home cook for less processed meals.    Physical Activity: No current routine. Plantar fasciitis and knee pain as well as being a single mom of 4 kids is a major barrier.   -- Goal of 30 minutes of aerobic exercise 5x a week or a total of 150 minutes weekly, and 30 minutes of resistance exercise 2x a week or a total of 60 min weekly.     Sleep: Currently sleeping 6 hours without sleep aids.    -- Recommend 7-8 hours of sleep at night.    Stress management: Current stressors: single mom of 3 kids  -- Recommend to try relaxation techniques such as breathing techniques, listening to music to help sleep at night and yoga stretching throughout the day to help control stress.     Weight gain causing medication: Gabapentin    Medication: Pt previously tried metformin but stopped d/t nausea. We discussed and decided to trial Metformin again. Instruction given; side effect profile discussed.  Prescription sent. Pt verbalized understanding and agreed to plan. Will follow.   RESTART Metformin 500 mg once a day with meals.   - Metformin: restarted 02/09/2022 at 338 lbs.  Tried  Contraindicated:  - Phentermine: d/t HTN  - Wellbutrin: d/t HTN    Obesity Surgery: Pt is open to learning more about surgery.  Provided Pt with handout to explain surgical options. Referral to bariatric surgery sent today. (Updated 02/09/2022)    Medical conditions:  Glaucoma: no  Seizures: no  Medullary thyroid cancer (personal or family hx): no  Multiple Endocrine Neoplasia: no  Palpitations/Tachycardia: no  Chest Pain: no past history of MI.   Headaches/Migraines: YES  Nephrolithiasis: no  H/o pancreatitis: no  GERD: no     Prior Surgeries:  Cholecystectomy: no  Hysterectomy: no    Obesity-associated comorbidities: hypertension, OSA (not using CPAP), hyperlipidemia  Current barriers include: single mom with 3 kids, works at [employer] Methods: None--Pt is not currently sexually active but will discuss birth control options with PCP

## 2022-02-09 NOTE — Unmapped (Signed)
Containing bowel. General surgeon, [name], MD, referred Wendy Chen to our [facility] clinic with a goal of BMI <=35 for surgery.

## 2022-02-09 NOTE — Unmapped (Signed)
[facility]    Assessment/Plan:     Problem List Items Addressed This Visit        Unprioritized    Essential hypertension - Primary (Chronic)     Today's BP: 170/90 is NOT at goal. BP continues to be elevated on chlorthalidone 25 mg. We discussed and decided to increase chlorthalidone to 50 mg. Prescription sent. Pt verbalized understanding and agreement with plan.      Medication: INCREASE chlorthalidone 25 mg --> 50 mg once daily         Relevant Medications    chlorthalidone (HYGROTON) 50 MG tablet    Other Relevant Orders    CBC w/ Differential (Completed)    Hepatic Function Panel (Completed)    TSH (Completed)    Hemoglobin A1c (Completed)    Vitamin D 25 Hydroxy (25OH D2 + D3) (Completed)    Sleep apnea, obstructive (Chronic)     Has a CPAP but doesn't use it. We discussed consequences of untreated OSA. I encouraged to use CPAP machine and weight loss including bariatric surgery may improve OSA.          Class 3 obesity (CMS-HCC)     Wendy Chen is a 41 y.o. 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 Comorbidities include hypertension, incisional hernia, hyperlipidemia, OSA (not using CPAP). 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    Pt needs to lose 120 lbs or BMI <35 to decrease risk of surgery--hernia repair. Spoke to Dr. Orvan Falconer that this degree of wt loss is probably not possible with medical management especially with her insurance not covering the GLP1s. She will discuss with Dr. Augustine Radar about bariatric surgery while we work with pts on healthy lifestyle habits and trial of medications that are covered by her insurance.     Weight Summary:  1. Starting weight/BMI/WC/[place]: 338 lbs, BMI 52.93, WC 52, [place] 16.5 (02/09/2022)  2. Target weight/goal: No goal weight. Patient wants general improvement in health and appearance.  Today's weight/BMI: (!) 153.3 kg (338 lb), BMI (!) 52.93 (02/09/2022)  3. % Body weight loss: N/A  Today's Waist Circumference: 52 inches (02/09/2022)  4. Today's visit number: 1st  5. Duration in program: NA    Referred by: Wendy Cape, MD (Surgery)    GOALS: 1) Cut out apple juice, cherry coke, sweet tea and mocha from [employer], switch to diet or water  2) Change breakfast to greek yogurt with fruit & nuts  3) Restart metformin    I have reviewed the patient's medical history, lifestyle history and labs/tests.   My recommendations include the following:    Eating Pattern: Lots of room for improvement in eating pattern. Pt is drinking 3-4 SSBs daily including apple juice, cherry coke, and sweet tea.   -- Discussed about healthier food choices and advised to home cook for less processed meals.    Physical Activity: No current routine. Plantar fasciitis and knee pain as well as being a single mom of 4 kids is a major barrier.   -- Goal of 30 minutes of aerobic exercise 5x a week or a total of 150 minutes weekly, and 30 minutes of resistance exercise 2x a week or a total of 60 min weekly.     Sleep: Currently sleeping 6 hours without  sleep aids.    -- Recommend 7-8 hours of sleep at night.    Stress management: Current stressors: single mom of 3 kids  -- Recommend to try relaxation techniques such as breathing techniques, listening to music to help sleep at night and yoga stretching throughout the day to help control stress.     Weight gain causing medication: Gabapentin    Medication: Pt previously tried metformin but stopped d/t nausea. We discussed and decided to trial Metformin again. Instruction given; side effect profile discussed.  Prescription sent. Pt verbalized understanding and agreed to plan. Will follow.   RESTART Metformin 500 mg once a day with meals.   - Metformin: restarted 02/09/2022 at 338 lbs.  Tried  Contraindicated:  - Phentermine: d/t HTN  - Wellbutrin: d/t HTN    Obesity Surgery: Pt is open to learning more about surgery.  Provided Pt with handout to explain surgical options. Referral to bariatric surgery sent today. (Updated 02/09/2022)    Medical conditions:  Glaucoma: no  Seizures: no  Medullary thyroid cancer (personal or family hx): no  Multiple Endocrine Neoplasia: no  Palpitations/Tachycardia: no  Chest Pain: no past history of MI.   Headaches/Migraines: YES  Nephrolithiasis: no  H/o pancreatitis: no  GERD: no     Prior Surgeries:  Cholecystectomy: no  Hysterectomy: no    Obesity-associated comorbidities: hypertension, OSA (not using CPAP), hyperlipidemia  Current barriers include: single mom with 3 kids, works at [employer] Methods: None--Pt is not currently sexually active but will discuss birth control options with PCP         Relevant Orders    Ambulatory referral to Bariatric Surgery    LLQ pain    Incisional hernia     Containing bowel. General surgeon, Wendy Cape, MD, referred Wendy Chen to our [facility] with a goal of BMI <=35 for surgery.          Mixed hyperlipidemia     Reviewed relevant medications and labs. Pt is following our [facility] Management Clinic. See Obesity tab for medication changes.     Lab Results   Component Value Date    CHOL 217 (H) 01/02/2020     Lab Results   Component Value Date    LDL Calculated 135 (H) 01/02/2020    HDL 57 01/02/2020    Triglycerides 124 01/02/2020     The 10-year ASCVD risk score (Arnett DK, et al., 2019) is: 7.6%         Other Visit Diagnoses     Hernia of abdominal cavity               Return in about 2 months (around 04/11/2022) for Weight clinic F/U one slot. metformin, need hernia surgery, need birth control. Q13mon for rest of yr.    Counseling     I spent a total of 60 minutes face-to-face with the patient, of which greater than 50% was spent counseling/coordinating care regarding obesity and management of above comorbidities. All questions answered. I have reviewed pt's lab results and medical records.    HPI:     Wendy Chen is a 41 y.o. female Body mass index is 52.93 kg/m??. Waist Circumference: 52 inches Neck Circumference: 16.5; seen at the request of Self, Referred    Motivations and goals      02/08/2022     5:21 PM   Intake Questions   What is your main reason for obesity treatment? My doctor recommended weight loss prior to a  procedure   What treatments are you interested in pursuing? I am open to any option that will work   Overall goal - A.) I would like to get down to _____ lbs. B.) I do not have a weight goal. I mainly want to feel better 250lbs        Weight History:      02/08/2022     5:21 PM   Weight History   What was your highest adult weight? (lbs) 353   What was your lowest adult weight on a diet or weight loss program? (lbs) 285   What was your lowest usual lowest adult weight? (lbs) 285   Please describe when and how you started gaining weight Starting gaining in elementary really put on weight in middle school an kept putting it on .   Have you had any of the following? Multiple family members who struggle with their weight    Started gaining weight before the age of 6   How much total weight did you gain with all your pregnancies that you were unable to lose after giving birth? 55   Please name the diets that have worked for you in the past; how much weight you lost; how long did it take to come back? Intermediate fasting but once I stop and pick up old habits I gained it all back Plus more          Weight Hx: Normal birth and childhood weight. Pt reports she has always been a big kid. Weighed 280 when having her first child 15 years ago. Weight jumped with pregnancy but Pt has always struggled with obesity. Reports intermittent fasting previously helped her go from 320 lbs --> 290 lbs.     Family Hx: Strong family history. No one with bariatric surgery.    Physical Activity: No current routine. Plantar fasciitis and knee pain are a barrier to physical activity. Pt is getting ~5,000    Social: Single mom with 3 boys (15, 10, & 4). Works at [employer] as a Engineer, production. No cigarettes or EtOH.     Dietary:     Eating Pattern: Typically eating breakfast and lunch at the cafeteria at [employer].    B: Eggs & sausage, apple juice  L: Fried chicken or salad bar with cherry coke  D: Pick up fast food ([employer], Mindi Slicker, [place]) or make spaghetti & meatballs, with sweet tea or soda  S: Chips, almonds or pickles, sometimes ice cream    Beverages: Cherry coke, sweet tea, apple juice    Dieting History:      02/08/2022     5:21 PM   Diet History   Are you currently working with a Registered Dietitian? No   Breakfast Sausage scrambled eggs potatoes with strawberry jelly   Morning Snack None   Lunch Chicken fried and salad   Afternoon Snack Chips dill pickles   Dinner Spaghetti with meatballs   After [employer], juice   Do you frequently feel hungry within 2 hours of having a regular size meal? No   Do you eat at times when you are not hungry? Yes   How many times a week? 5   Do you eat for comfort when you are stressed or emotional? Yes   How many times a week? 7   Are there times when you eat and it feels like you can't stop yourself from eating? No   Do you try to manage your weight by vomiting,  using laxatives, diuretics, or excessive exercise? No   Do you sometimes find food on your bed which you do not remember eating? No   Do you eat late at night? Yes   How many times a week? 6   Please list foods that you eat frequently Pickle salt and vinegar chips , chicken and pork chop eggs   How active are you at work? I am constantly on my feet and moving around all the time   Do you exercise regularly? No        Physical Activity:         02/08/2022     5:21 PM   Physical Activity History   How active are you at work? I am constantly on my feet and moving around all the time   Do you exercise regularly? No   Type of Exercise Planks   How many minutes do you exercise? 3   How many times do you exercise in a week? 2   If you do not exercise, it is because of? I???m tired or my body hurts   Which physical activity do you enjoy? Playing basketball with the kids when my body not hurting   Are there certain actions or activities that you cannot do because of your weight? Running      Medications:     Current medications with potential to cause weight gain:yes, gabapentin    Sleep:         02/08/2022     5:21 PM   Sleep History   How many hours do you sleep at night on average? 6   What time do you fall asleep? 10:50 PM   What time do you wake up without needing to go back to sleep?  5:48 AM   How many times do you wake up per night? 3   What time do you get the last drink of the day? 10:03 PM   Have you been diagnosed with Obstructive Sleep Apnea (OSA)? Yes   Do you currently use a CPAP or other device for OSA? No   STOP-BANG Score Incomplete        Stress:         02/08/2022     5:21 PM   Stress History   On a scale of 1 to 10, how stressed were you in the past year? 10   What do you do to manage your stress? Listen to music        Mental Health History(emotional health):     Any thoughts about harming yourself or wanting to die: no  Engaged in any self harming behaviors e.g. cutting yourself : no  Have you been to the ER or hospitalized for mental health reasons :no  Any alcohol or substance abuse, including prescription abuse : no    Past Medical and Surgical History:     Active Ambulatory Problems     Diagnosis Date Noted   ??? Essential hypertension 04/06/2016   ??? 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 Hx of trauma (psychological) 10/04/2016   ??? Migraines 10/04/2016   ??? Heart murmur 10/04/2016   ??? Sleep apnea, obstructive 03/02/2017   ??? Encounter for Nexplanon removal 08/21/2017   ??? Class 3 obesity (CMS-HCC) 09/04/2017   ??? Hidradenitis suppurativa 09/19/2018   ??? Medication side effect 11/13/2018   ??? Tension headache 01/12/2019   ??? Low back pain 03/12/2019   ??? EIC (epidermal inclusion cyst) 10/11/2020   ???  Right carpal tunnel syndrome 02/01/2021   ??? Left carpal tunnel syndrome 02/01/2021   ??? 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 LLQ pain 11/29/2021   ??? Hidradenitis 12/05/2021   ??? Incisional hernia 02/09/2022   ??? Mixed hyperlipidemia 02/09/2022     Resolved Ambulatory Problems     Diagnosis Date Noted   ??? Morbid obesity with BMI of 50.0-59.9, adult (CMS-HCC) 04/06/2016   ??? Supervision of high risk pregnancy in third trimester 10/04/2016   ??? Obesity affecting pregnancy in second trimester 10/04/2016   ??? Boils 10/04/2016   ??? Hx of cesarean section 10/04/2016   ??? AMA age 83 10/04/2016   ??? Pain of left calf 10/04/2016   ??? Encounter for procreative genetic counseling 10/05/2016   ??? Snoring 12/08/2016   ??? Poor fetal growth affecting management of mother in third trimester 04/02/2017   ??? Cesarean delivery delivered 04/03/2017   ??? Impaired glucose metabolism 08/21/2017   ??? Cough 09/04/2017   ??? Diaphoresis 10/08/2017   ??? Cellulitis and abscess of upper extremity 09/13/2018     Past Medical History:   Diagnosis Date   ??? Hypertension    ??? Migraine    ??? Obesity    ??? Postpartum depression 2007, 2012   ??? Sleep apnea        Past Surgical History:   Procedure Laterality Date   ??? CESAREAN SECTION  2007, 2012   ??? PR CESAREAN DELIVERY ONLY N/A 04/03/2017    Procedure: CESAREAN DELIVERY ONLY;  Surgeon: Barb Merino, MD;  Location: L&D [facility]-SECTION [facility] SUITES [facility];  Service: Family Planning   ??? PR REVISE MEDIAN N/CARPAL TUNNEL SURG Right 03/02/2021    Procedure: NEUROPLASTY AND/OR TRANSPOSITION; MEDIAN NERVE AT CARPAL TUNNEL;  Surgeon: Daisy Lazar, MD;  Location: ASC OR [facility];  Service: Orthopedics   ??? PR REVISE MEDIAN N/CARPAL TUNNEL SURG Left 03/16/2021    Procedure: NEUROPLASTY AND/OR TRANSPOSITION; MEDIAN NERVE AT CARPAL TUNNEL;  Surgeon: Theodora Blow Jacqlyn Krauss, MD; Location: ASC OR [facility];  Service: Orthopedics   ??? WISDOM TOOTH EXTRACTION         Patients previous medical records from [facility] reviewed and summarized in the Obesity Medicine Evaluation Summary     Medication list:       Current Outpatient Medications:   ???  ascorbic acid, vitamin C, (VITAMIN C) 500 MG tablet, Take 1 tablet (500 mg total) by mouth daily., Disp: 14 tablet, Rfl: 0  ???  empty container Misc, Use as directed to dispose of Humira pens., Disp: 1 each, Rfl: 2  ???  gabapentin (NEURONTIN) 100 MG capsule, Take 1 capsule (100 mg total) by mouth Three (3) times a day for 5 days., Disp: 15 capsule, Rfl: 0  ???  HUMIRA PEN CITRATE FREE STARTER PACK FOR CROHN'S/UC/HS 3 X 80 MG/0.8 ML, Inject the contents of 2 pens (160mg ) under the skin on day 1, THEN inject 1 pen (80mg ) on day 15., Disp: 3 each, Rfl: 0  ???  ibuprofen (MOTRIN) 800 MG tablet, Take 1 tablet 3 or 4 times a day as needed for pain and inflammation. Take with food to prevent stomach problems., Disp: 60 tablet, Rfl: 0  ???  ADALIMUMAB PEN CITRATE FREE 40 MG/0.4 ML, Inject the contents of 1 pen (40mg ) under the skin weekly as maintenance., Disp: 4 each, Rfl: 11  ???  chlorthalidone (HYGROTON) 50 MG tablet, Take 1 tablet (50 mg total) by mouth daily., Disp: 90 tablet, Rfl: 3  ???  metFORMIN (GLUCOPHAGE-XR) 500 MG 24 hr tablet, Take 1 tablet (500 mg total) by mouth Two (2) times a day., Disp: 120 tablet, Rfl: 3  Above medication list reviewed and updated.    Medications         02/08/2022     5:21 PM   Weight Loss Medication History   Have you ever used any of the medications from this list? Metformin   Enter the amount of weight loss and side-effects for any medications selected in the previous question It made my stomach hurt didn???t like it. The doctor took me off. I got on it because of my h.s.        Relevant symptom review:     Glaucoma: no  Palpitations: no  Chest Pain: no  Headaches: yes, n/a  Nephrolithiasis: no  Seizures: no  H/o pancreatitis: no  Personal or family history of medullary cancer of thyroid:no    Musculoskeletal pain no  Urinary incontinence no  Heartburn no     Symptoms of cushings disease: no  H/o head trauma: no  H/o radiation to the brain: no    Irregular menstrual cycles: No  Excessive facial hair: No    Female patient ROS:      Last menstrual period: No LMP recorded.  Current contraceptive use: None.    All other systems reviewed: Yes.    Relevant Family History:       Family History   Problem Relation Age of Onset   ??? Diabetes Mother    ??? Hypertension Mother    ??? Hypertension Father    ??? Diabetes Father    ??? Hypertension Sister    ??? Stroke Brother    ??? Seizures Brother    ??? Heart attack Brother    ??? Hypertension Brother    ??? Cancer Maternal Uncle         bone, colon   ??? Alzheimer's disease Paternal Uncle    ??? Colon cancer Paternal Uncle    ??? Heart murmur Maternal Grandmother    ??? Diabetes Maternal Grandmother    ??? Diabetes Maternal Grandfather    ??? Heart murmur Maternal Grandfather    ??? Anesthesia problems Neg Hx    ??? Bleeding Disorder Neg Hx    ??? Melanoma Neg Hx    ??? Basal cell carcinoma Neg Hx    ??? Squamous cell carcinoma Neg Hx          Social History     Socioeconomic History   ??? Marital status: Single     Spouse name: None   ??? Number of children: None   ??? Years of education: None   ??? Highest education level: None   Tobacco Use   ??? Smoking status: Never   ??? Smokeless tobacco: Never   Substance and Sexual Activity   ??? Alcohol use: No     Alcohol/week: 0.0 standard drinks   ??? Drug use: No   Other Topics Concern   ??? Do you use sunscreen? No   ??? Tanning bed use? No   ??? Are you easily burned? No   ??? Excessive sun exposure? No   ??? Blistering sunburns? No     Social Determinants of Health     Financial Resource Strain: Medium Risk   ??? Difficulty of Paying Living Expenses: Somewhat hard   Food Insecurity: Unknown   ??? Worried About Running Out of Food in the Last Year: Patient refused   ??? Ran Out of Food in  the Last Year: Patient refused Transportation Needs: No Transportation Needs   ??? Lack of Transportation (Medical): No   ??? Lack of Transportation (Non-Medical): No       Vital Signs:     Blood pressure 170/90, pulse 67, temperature 36.4 ??C (97.5 ??F), temperature source Temporal, height 170.2 cm (5' 7.01), weight (!) 153.3 kg (338 lb), SpO2 96 %, currently breastfeeding.  Body mass index is 52.93 kg/m??. Waist Circumference: 52 inches Neck Circumference: 16.5    Physical Exam:     General exam: Patient appears calm and alert and oriented. Body mass index is 52.93 kg/m??.   Body fat distribution Central adiposity and General adiposity. No supraclavicular adiposity. No dorsal adiposity. Waist Circumference: 52 inches Neck Circumference: 16.5  Thyroid exam: no thyromegaly, no bruit.   Lymphatics: no lymphadenopathy.  Skin exam: No lipomas, moderate acanthosis nigricans.   Oral exam : Tongue moist, pink. mild OP crowding. Good dental hygiene.  CVS: RRR nl. s1s2, Peripheral edema none  Respiratory: CTAB .  Gastrointestinal Abdomen soft. NT/ND.Large pannus. Intertrigo: no. No hepatomegaly.  Musculoskeletal: Patient ambulatory without help.    Labs:      Lab work reviewed in [facility] and is noted underneath    Previous studies were reviewed. Labs listed below.    Lab Results   Component Value Date    A1C 4.9 02/09/2022    A1C 4.7 (L) 01/02/2020    A1C 4.9 09/30/2018    GLU 99 12/15/2021    GLU 83 11/29/2021    GLU 63 (L) 08/22/2021     Lab Results   Component Value Date    TSH 1.053 02/09/2022     Lab Results   Component Value Date    CHOL 217 (H) 01/02/2020    HDL 57 01/02/2020    TRIG 454 01/02/2020     Lab Results   Component Value Date    NA 139 12/15/2021    K 3.6 12/15/2021    CL 101 12/15/2021    CO2 26.0 12/15/2021    BUN 20 12/15/2021    CREATININE 0.78 12/15/2021    GFR >= 60 01/01/2012    GLU 99 12/15/2021    CALCIUM 9.9 12/15/2021    ALBUMIN 3.6 02/09/2022    AST 20 02/09/2022    ALT 16 02/09/2022    ALKPHOS 66 02/09/2022       Lab Results Component Value Date    Vitamin D Total (25OH) 19.9 (L) 02/09/2022     Lab Results   Component Value Date    WBC 5.4 02/09/2022    RBC 4.57 02/09/2022    HGB 12.5 02/09/2022    HCT 38.1 02/09/2022    MCV 83.5 02/09/2022    MCH 27.5 02/09/2022    MCHC 32.9 02/09/2022    RDW 13.9 02/09/2022    PLT 326 02/09/2022             I attest that I, Constance Goltz, personally documented this note while acting as scribe for Marshell Garfinkel, MD.      Constance Goltz, Scribe.  02/09/2022     The documentation recorded by the scribe accurately reflects the service I personally performed and the decisions made by me.    Marshell Garfinkel, MD

## 2022-02-14 LAB — VITAMIN D 25 HYDROXY: VITAMIN D, TOTAL (25OH): 19.9 ng/mL — ABNORMAL LOW (ref 20.0–80.0)

## 2022-02-24 DIAGNOSIS — L732 Hidradenitis suppurativa: Principal | ICD-10-CM

## 2022-03-01 DIAGNOSIS — K432 Incisional hernia without obstruction or gangrene: Principal | ICD-10-CM

## 2022-03-01 DIAGNOSIS — L732 Hidradenitis suppurativa: Principal | ICD-10-CM

## 2022-03-01 DIAGNOSIS — K469 Unspecified abdominal hernia without obstruction or gangrene: Principal | ICD-10-CM

## 2022-03-01 MED ORDER — HUMIRA PEN CITRATE FREE 40 MG/0.4 ML
SUBCUTANEOUS | 3 refills | 0.00000 days | Status: CP
Start: 2022-03-01 — End: ?
  Filled 2022-03-03: qty 4, 28d supply, fill #0

## 2022-03-01 NOTE — Unmapped (Signed)
-----   Message from [name], MD sent at 02/28/2022  6:05 PM EDT -----  Sinda Du,  Can you call pt and let her know that we discussed her at hernia conference, and we recommend that she lose weight prior to elective hernia repair? She has seen Dr. Loa Socks already for medical weight loss, and Dr. Yetta Flock is happy to see her to discuss bariatric surgery (I've added Dr. Yetta Flock to this message as well--thank you!). Can you place a referral for pt to see Dr. Yetta Flock please?  Also, please let pt know that a maternity or a postpartum belt might help give the hernia more support and help with any symptoms she might have from the hernia; just want to let her know that option.  Thank you!  [place]

## 2022-03-01 NOTE — Unmapped (Signed)
Pt aware and verbalizes understanding. Referral has been placed.

## 2022-03-02 NOTE — Unmapped (Signed)
[facility] Refill Coordination Note    Specialty Medication(s) to be Shipped:   Inflammatory Disorders: Humira    Other medication(s) to be shipped: No additional medications requested for fill at this time     Wendy Chen, DOB: 1980/12/25  Phone: [phone] (home)       All above HIPAA information was verified with patient.     Was a Nurse, learning disability used for this call? No    Completed refill call assessment today to schedule patient's medication shipment from the [facility] [phone]).  All relevant notes have been reviewed.     Specialty medication(s) and dose(s) confirmed: Regimen is correct and unchanged.   Changes to medications: Wendy Chen reports no changes at this time.  Changes to insurance: No  New side effects reported not previously addressed with a pharmacist or physician: None reported  Questions for the pharmacist: No    Confirmed patient received a Conservation officer, historic buildings and a Surveyor, mining with first shipment. The patient will receive a drug information handout for each medication shipped and additional FDA Medication Guides as required.       DISEASE/MEDICATION-SPECIFIC INFORMATION        For patients on injectable medications: Patient currently has 2 doses left.  Next injection is scheduled for 03/03/22.    SPECIALTY MEDICATION ADHERENCE     Medication Adherence    Patient reported X missed doses in the last month: 0  Specialty Medication: Humira  Patient is on additional specialty medications: No  Any gaps in refill history greater than 2 weeks in the last 3 months: no  Demonstrates understanding of importance of adherence: yes  Informant: patient  Reliability of informant: reliable  Confirmed plan for next specialty medication refill: delivery by pharmacy  Refills needed for supportive medications: not needed              Were doses missed due to medication being on hold? No      REFERRAL TO PHARMACIST     Referral to the pharmacist: Not needed      [facility]     Shipping address confirmed in Epic.     Delivery Scheduled: Yes, Expected medication delivery date: 03/03/22.     Medication will be delivered via Same Day Courier to the prescription address in Epic WAM.    Wendy Chen Wendy Chen   [facility] Specialty Technician

## 2022-03-02 NOTE — Unmapped (Signed)
Refill of Humira sent. Pt due for Angela Burke April 2023

## 2022-03-23 ENCOUNTER — Ambulatory Visit
Admit: 2022-03-23 | Discharge: 2022-03-24 | Payer: PRIVATE HEALTH INSURANCE | Attending: Student in an Organized Health Care Education/Training Program | Primary: Student in an Organized Health Care Education/Training Program

## 2022-03-23 DIAGNOSIS — Z3041 Encounter for surveillance of contraceptive pills: Principal | ICD-10-CM

## 2022-03-23 MED ORDER — NORETHINDRONE (CONTRACEPTIVE) 0.35 MG TABLET
ORAL_TABLET | Freq: Every day | ORAL | 3 refills | 84 days | Status: CP
Start: 2022-03-23 — End: 2023-03-23
  Filled 2022-03-24: qty 84, 84d supply, fill #0

## 2022-03-23 NOTE — Unmapped (Signed)
[facility]- [facility]  Established Patient Clinic Note    Assessment/Plan:     Problem List Items Addressed This Visit        Other    Encounter for surveillance of contraceptive pills - Primary     Pt desires contraception; given weight, age, and HTN, estrogen contraindicated.  Discussed P only methods and pt elects for pills.  Discussed methods of adherence.  Upreg negative.         Relevant Medications    norethindrone (ORTHO MICRONOR) 0.35 mg tablet    Other Relevant Orders    POCT Pregnancy Urine, Qualitative     Attending: Dr. Manson Passey    Subjective   Wendy Chen is a 41 y.o. female  coming to clinic today for the following issues:    Chief Complaint   Patient presents with   ??? Contraception     Pill form     HPI:    #birth control  Desires birth control   Wants P only    ROS as above in HPI, otherwise remaining ROS negative.    I have reviewed the problem list, medications, and allergies and have updated/reconciled them if needed.    Wendy Chen  reports that she has never smoked. She has never used smokeless tobacco.  Health Maintenance   Topic Date Due   ??? COVID-19 Vaccine (3 - Pfizer risk series) 04/12/2020   ??? Serum Creatinine Monitoring  12/15/2022   ??? Potassium Monitoring  12/15/2022   ??? Lipid Screening  01/01/2025   ??? HPV Cotest with Pap Smear (21-65)  12/02/2026   ??? Pap Smear with Cotest HPV (21-65)  12/02/2026   ??? DTaP/Tdap/Td Vaccines (4 - Td or Tdap) 02/08/2027   ??? Hepatitis C Screen  Completed   ??? Influenza Vaccine  Completed   ??? Pneumococcal Vaccine 0-64  Aged Out       Objective     VITALS: BP 113/74  - Pulse 79  - Temp 36.1 ??C (97 ??F) (Temporal)  - Ht 170.2 cm (5' 7)  - Wt (!) 151.6 kg (334 lb 3.2 oz)  - LMP 02/28/2022  - BMI 52.34 kg/m??     Physical Exam   Gen: NAD, pleasant and conversant  HEENT: sclera anicteric, normocephalic/atraumatic  Resp: NWOB  CV: normotensive  Abd: no visible distention   MSK: FROMx4  Skin: warm, dry, well-perfused  Extrems: symmetric  Neuro: AAOx3, no gross focal deficits    LABS/IMAGING  I have reviewed pertinent recent labs and imaging in Epic    [facility]  [place] [facility] [place] [facility]  [address], [place], [place] [address] ??? Telephone [phone] ??? Fax [phone]  [email or web address]

## 2022-03-23 NOTE — Unmapped (Signed)
Pt desires contraception; given weight, age, and HTN, estrogen contraindicated.  Discussed P only methods and pt elects for pills.  Discussed methods of adherence.  Upreg negative.

## 2022-03-28 NOTE — Unmapped (Addendum)
Called pt to schedule MO visit with dr Devra Dopp ----- Message from Riley Kill, MD sent at 03/28/2022  9:38 AM EDT -----  Regarding: RE: Referal Review  Yes she is a surgical candidate. I will continue to see her and work on lifestyle and add medications. She has osa but does not use it. Does she need to see me monthly for 6 months? Sarah  ----- Message -----  From: Sandy Salaam  Sent: 03/28/2022   8:45 AM EDT  To: Riley Kill, MD  Subject: Referal Review                                   Hey Dr.RO This pt was referred for surgery by another provider- per our surgical process pts are first assessed by Med Obesity. Since she has already seen you, would you agree that she is a surgical candidate or would you like to see her in follow up first to determine this. We can always have her see our NP w/ the surgical team first, but if they are established with a Med Obesity provider, we perfer that provider to  assess and let us know if they are medically qualified for surgery  ----- Message -----  From: Cyd Silence, RN  Sent: 03/07/2022   9:02 AM EDT  To: Sandy Salaam    Good morning Tori,    This patient was referred to [facility] for bariatric surgery.  She needs a hernia repair and will need weight loss surgery.  Can we get her plugged in please.    I thank you!  Misty Stanley

## 2022-03-28 NOTE — Unmapped (Signed)
Weight Management Services Financial Information Form    Hospital NPI: [id number]   Physicians NPI: [id number]     MRN: [id number]  Patient: Wendy Chen    Insurance Company: Medicaid Managed Plan: [facility]   Telephone:  Policy/Subscriber ID:     Subscriber Information (if different from patient)  Name: n/a  Date of Birth:  n/a     Is Bariatric Surgery Covered: Yes  Covered Surgeries:   [phone]: Laparoscopic roux-en-y gastric bypass   [phone]: Laparoscopic gastric sleeve     **Completion of this form does not guarantee coverage, and is subject to change. The patient is instructed at the time of scheduling that they need to call their insurance company directly to confirm bariatric surgery coverage benefits**     What is the speciality copay? $10    What requirements must be met for surgery to be approved:  Documented BMI > 35 for at least 12 months prior to requesting surgery w/ one co-morbidity. Patient must see surgeon before appointment with Bariatric Intake Clinic    Physician Supervised Diet? Yes How Long? 3 months  Does it have to be consecutive? Yes  Does it have to be done by PCP or can it be by a dietitian?  Either (RD if physician supervised)  How recent does it have to be? (past , , etc.) 1 year (but 3 consecutive months within year)  Do I need a weight history? (11yr, 72yrs, 23yrs) Yes  Do I need a clearance/referral letter from a doctor? Yes   If yes, does it have to come from PCP? Yes  Are nutrition consultations a covered benefit? Yes  (List next to visit type max number of visits, if applicable)  [id number]: 15, 30, 45, 60 min preventive   97802: Initial 1:1 nutrition counseling   [phone]: Follow up 1:1 nutrition counseling   97804: Group MNT nutrition counseling   G0447: IBT Individual  G0473: IBT Group

## 2022-04-12 NOTE — Unmapped (Signed)
The [facility] has made a second and final attempt to reach this patient to refill the following medication:Humira.      We have left voicemails on the following phone numbers: [phone], have sent a MyChart message and have sent a text message to the following phone numbers: [phone].    Dates contacted: 5/9 and 5/17  Last scheduled delivery: 4/7    The patient may be at risk of non-compliance with this medication. The patient should call the [facility] at [phone]  Option 4, then Option 2 (all other specialty patients) to refill medication.    Ayodele Hartsock D Administrator Shared [facility] Specialty Technician

## 2022-04-14 NOTE — Unmapped (Signed)
[facility] Refill Coordination Note    Specialty Medication(s) to be Shipped:   Inflammatory Disorders: Humira    Other medication(s) to be shipped: No additional medications requested for fill at this time     Wendy Chen, DOB: 04-Mar-1981  Phone: [phone] (home)       All above HIPAA information was verified with patient.     Was a Nurse, learning disability used for this call? No    Completed refill call assessment today to schedule patient's medication shipment from the [facility] [phone]).  All relevant notes have been reviewed.     Specialty medication(s) and dose(s) confirmed: Regimen is correct and unchanged.   Changes to medications: [name] reports starting the following medications: birth control pills  Changes to insurance: No  New side effects reported not previously addressed with a pharmacist or physician: None reported  Questions for the pharmacist: No    Confirmed patient received a Conservation officer, historic buildings and a Surveyor, mining with first shipment. The patient will receive a drug information handout for each medication shipped and additional FDA Medication Guides as required.       DISEASE/MEDICATION-SPECIFIC INFORMATION        For patients on injectable medications: Patient currently has 0 doses left.  Next injection is scheduled for 04/21/22.    SPECIALTY MEDICATION ADHERENCE     Medication Adherence    Patient reported X missed doses in the last month: 0  Specialty Medication: HUMIRA 40mg /0.3mL  Patient is on additional specialty medications: No  Informant: patient              Were doses missed due to medication being on hold? No      REFERRAL TO PHARMACIST     Referral to the pharmacist: Not needed      [facility]     Shipping address confirmed in Epic.     Delivery Scheduled: Yes, Expected medication delivery date: 04/19/22.     Medication will be delivered via Same Day Courier to the prescription address in Epic [place].    Wyatt Mage M [facility] Specialty Technician

## 2022-04-18 NOTE — Unmapped (Deleted)
[facility] Follow Up    Assessment/Plan:     Chief Complaint   Patient presents with   ??? Follow-up     Wt mngmt       Problem List Items Addressed This Visit        Unprioritized    Essential hypertension - Primary (Chronic)     Today's BP: 149/92 is borderline. Pl follow up with PCP for tighter bp control. Also-See Overweight tab. Pt verbalized understanding and agreement with plan.     Lab Results   Component Value Date    CREATININE 0.78 12/15/2021         Continue low salt diet, regular exercise and DASH diet. Follow up as directed for HTN.         Class 3 obesity (CMS-HCC)     Wendy Chen is a 41 y.o. 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 Comorbidities include hypertension, incisional hernia, hyperlipidemia, OSA (not using CPAP). 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    Pt needs to lose 120 lbs or BMI <35 to decrease risk of surgery--hernia repair. Spoke to Dr. Orvan Falconer that this degree of wt loss is probably not possible with medical management especially with her insurance not covering the GLP1s. She is scheduled to see Dr. Yetta Flock with bariatric surgery 04/21/22, in the meantime we will work with pts on healthy lifestyle habits and trial of medications that are covered by her insurance. Pt is also not using any birth control methods. Pt verbalized understanding that birth control is needed to try any other anti-obesity meds.     Weight Summary:  1. Starting weight/BMI/WC/Lindy: 338 lbs, BMI 52.93, WC 52, Justice 16.5 (02/09/2022)  2. Target weight/goal: No goal weight. Patient wants general improvement in health and appearance.  4. Today's weight/BMI: (!) 148.8 kg (328 lb 1.3 oz), BMI (!) 51.37 (04/19/2022)  3. %  Body Weight loss: 3%  2. Today's Waist Circumference: 49.5 inches (04/19/2022)  4. Today's visit number: 2nd  5. Duration in program: 2 months    Referred by: [name], MD (Surgery)    GOALS: 1) Limit SSB such as juice. 2) Increase physical activity by going to [employer] 3-4x/ week. 3) Increase Metformin to 1000mg  twice daily with meals.    I have reviewed the patient's medical history, lifestyle history and labs/tests.   My recommendations include the following:    Eating Pattern: Pt has made positive diet changes and is now eating out rarely. Was previously eating at work but is now eating at home due to summer break at [employer].  - Discussed increasing protein intake through options such as low sugar greek yogurt, eggs, chicken.  - Recommended limiting SSB such as juice.    Physical Activity: No formal exercise currently but pt plans to return to [employer].  -- Recommended slowly increasing exercise volume/ time to avoid injury.  - Recommended scheduling personal training session with [employer].    Sleep: Currently sleeping 6 hours without sleep aids.    -- Recommend 7-8 hours of sleep at night.    Stress management: Current stressors: single mom of 3 kids  -- Recommend to try relaxation techniques such as breathing techniques, listening to music to help sleep at night and yoga stretching throughout  the day to help control stress.     Weight gain causing medication: Gabapentin    Medication:    Metformin was restarted last office visit. Taking as instructed without any s/e.   INCREASE  Metformin to 1000 mg twice a day with lunch and dinner.   - Metformin: restarted 02/09/2022 at 338 lbs.  If on birth control, can consider GLP1 (currently medicaid does not cover wt loss meds). topirmate low dose may be an option if depression under great control.   Tried  Contraindicated:  - Phentermine: d/t HTN  - Wellbutrin: d/t HTN    Obesity Surgery: Pt is scheduled to see bariatric surgery 04/21/22.    Medical conditions:  Glaucoma: no  Seizures: no  Medullary thyroid cancer (personal or family hx): no  Multiple Endocrine Neoplasia: no  Palpitations/Tachycardia: no  Chest Pain: no past history of MI.   Headaches/Migraines: YES  Nephrolithiasis: no  H/o pancreatitis: no  GERD: no     Prior Surgeries:  Cholecystectomy: no  Hysterectomy: no    Obesity-associated comorbidities: hypertension, OSA (not using CPAP), hyperlipidemia  Current barriers include: single mom with 3 kids, works at [employer] Methods: None--Pt is not currently sexually active but will discuss birth control options with PCP             Return in about 3 months (around 07/20/2022) for Weight clinic F/U one slot..    I have reviewed and addressed the patient???s adherence and response to prescribed medications. I have identified patient barriers to following the proposed medication and treatment plan, and have noted opportunities to optimize healthy behaviors. I have answered the patient???s questions to satisfaction and the patient voices understanding.    Time: Greater than 50% of this encounter was spent in direct consultation with the patient in evaluation and discussing all of the above. Duration of encounter: 30 minutes.     HPI:     Wendy Chen is a 42 y.o. female who  has a past medical history of AMA age 43 (10/04/2016), Heart murmur, Hidradenitis suppurativa (2018), Hypertension, Migraine, Obesity, Postpartum depression (2007, 2012), Procreative genetic counseling (10/05/2016), Sleep apnea, and Snoring (12/08/2016). who presents today for [facility] follow up.    Weight Management History  Wt Readings from Last 6 Encounters:   04/21/22 (!) 148.4 kg (327 lb 1.6 oz)   04/19/22 (!) 148.8 kg (328 lb 1.3 oz)   03/23/22 (!) 151.6 kg (334 lb 3.2 oz)   02/09/22 (!) 153.3 kg (338 lb)   01/11/22 (!) 154.9 kg (341 lb 8 oz)   11/29/21 (!) 153 kg (337 lb 3.2 oz)         02/09/2022     1:00 PM 04/19/2022     9:45 AM Waist Circumference   Waist Circumference 52 inches 49.5 inches       ***graph  Update:   Medication:   Eating pattern:   Physical Activity:   Barrier:     Lab Results   Component Value Date    LDL 135 (H) 01/02/2020    HDL 57 01/02/2020    A1C 4.9 02/09/2022    GLU 99 12/15/2021    TSH 1.053 02/09/2022    VITDTOTAL 19.9 (L) 02/09/2022    AST 20 02/09/2022    ALT 16 02/09/2022     Past Medical/Surgical History:     Past Medical History:   Diagnosis Date   ??? AMA age 38 10/04/2016    We discussed  the patient's age related risk of aneuploidy. ??We discussed screening options including serum screening and NIPT as well as diagnostic options of CVS and amniocentesis. ??We discussed risks, benefits and diagnostic capability of both CVS and amniocentesis.   -ordered genetic consult and first trimester screen  -Age 46 at [facility] -S/p genetic counseling 11/17 -Negative cfDNA -Fetal fraction 1.5% (Fetal fraction is affected by fetal aneuploidy (trisomy 13 and 18), maternal BMI, and other maternal conditions such as preexisting hypertension. In the setting of low fetal fraction (typically defined in the literature as <4%) sensitivity of cfDNA screening may be reduced Janyth Contes, 2015. Reprod Sci).     ??? Heart murmur     benign, no abx prophylaxis required, 2012 echo noted trivial TR   ??? Hidradenitis suppurativa 2018   ??? Hypertension    ??? Migraine    ??? Obesity    ??? Postpartum depression 2007, 2012   ??? Procreative genetic counseling 10/05/2016    Genetic counseling visit on 10/09/16 Aneuploidy screening/ testing: cfDNA; results negative - low fetal fraction Carrier screening: [x]  Cystic fibrosis - neg x 106 mutationss [x]  Spinal muscular atrophy - 3 copies; reduced carrier risk [x]  Hemoglobinopathy screening - normal adult hemoglobin present     ??? Sleep apnea    ??? Snoring 12/08/2016     Past Surgical History:   Procedure Laterality Date   ??? CESAREAN SECTION  2007, 2012   ??? PR CESAREAN DELIVERY ONLY N/A 04/03/2017    Procedure: CESAREAN DELIVERY ONLY;  Surgeon: Barb Merino, MD;  Location: L&D C-SECTION OR SUITES [facility];  Service: Family Planning   ??? PR REVISE MEDIAN N/CARPAL TUNNEL SURG Right 03/02/2021    Procedure: NEUROPLASTY AND/OR TRANSPOSITION; MEDIAN NERVE AT CARPAL TUNNEL;  Surgeon: Daisy Lazar, MD;  Location: [facility] OR [facility];  Service: Orthopedics   ??? PR REVISE MEDIAN N/CARPAL TUNNEL SURG Left 03/16/2021    Procedure: NEUROPLASTY AND/OR TRANSPOSITION; MEDIAN NERVE AT CARPAL TUNNEL;  Surgeon: Theodora Blow Jacqlyn Krauss, MD;  Location: [facility]  Service: Orthopedics   ??? WISDOM TOOTH EXTRACTION         Social History:     Social History     Socioeconomic History   ??? Marital status: Single     Spouse name: None   ??? Number of children: None   ??? Years of education: None   ??? Highest education level: None   Tobacco Use   ??? Smoking status: Never   ??? Smokeless tobacco: Never   Vaping Use   ??? Vaping Use: Never used   Substance and Sexual Activity   ??? Alcohol use: No     Alcohol/week: 0.0 standard drinks   ??? Drug use: No   Other Topics Concern   ??? Do you use sunscreen? No   ??? Tanning bed use? No   ??? Are you easily burned? No   ??? Excessive sun exposure? No   ??? Blistering sunburns? No     Social Determinants of Health     Financial Resource Strain: Low Risk    ??? Difficulty of Paying Living Expenses: Not very hard   Food Insecurity: Unknown   ??? Worried About Running Out of Food in the Last Year: Patient refused   ??? Ran Out of Food in the Last Year: Patient refused   Transportation Needs: No Transportation Needs   ??? Lack of Transportation (Medical): No   ??? Lack of Transportation (Non-Medical): No       Family History:  Family History   Problem Relation Age of Onset   ??? Diabetes Mother    ??? Hypertension Mother    ??? Hypertension Father    ??? Diabetes Father    ??? Hypertension Sister    ??? Stroke Brother    ??? Seizures Brother    ??? Heart attack Brother    ??? Hypertension Brother    ??? Cancer Maternal Uncle         bone, colon ??? Alzheimer's disease Paternal Uncle    ??? Colon cancer Paternal Uncle    ??? Heart murmur Maternal Grandmother    ??? Diabetes Maternal Grandmother    ??? Diabetes Maternal Grandfather    ??? Heart murmur Maternal Grandfather    ??? Anesthesia problems Neg Hx    ??? Bleeding Disorder Neg Hx    ??? Melanoma Neg Hx    ??? Basal cell carcinoma Neg Hx    ??? Squamous cell carcinoma Neg Hx        Allergies:     Patient has no known allergies.    Current Medications:     Current Outpatient Medications   Medication Sig Dispense Refill   ??? ascorbic acid, vitamin C, (VITAMIN C) 500 MG tablet Take 1 tablet (500 mg total) by mouth daily. 14 tablet 0   ??? chlorthalidone (HYGROTON) 50 MG tablet Take 1 tablet (50 mg total) by mouth daily. 90 tablet 3   ??? empty container Misc Use as directed to dispose of Humira pens. 1 each 2   ??? HUMIRA PEN CITRATE FREE 40 MG/0.4 ML Inject the contents of 1 pen (40mg ) under the skin weekly as maintenance. 4 each 3   ??? HUMIRA PEN CITRATE FREE STARTER PACK FOR CROHN'S/UC/HS 3 X 80 MG/0.8 ML Inject the contents of 2 pens (160mg ) under the skin on day 1, THEN inject 1 pen (80mg ) on day 15. 3 each 0   ??? ibuprofen (MOTRIN) 800 MG tablet Take 1 tablet 3 or 4 times a day as needed for pain and inflammation. Take with food to prevent stomach problems. 60 tablet 0   ??? metFORMIN (GLUCOPHAGE-XR) 500 MG 24 hr tablet Take 1 tablet (500 mg total) by mouth Two (2) times a day. 120 tablet 3   ??? norethindrone (ORTHO MICRONOR) 0.35 mg tablet Take 1 tablet by mouth daily. 84 tablet 3   ??? gabapentin (NEURONTIN) 100 MG capsule Take 1 capsule (100 mg total) by mouth Three (3) times a day for 5 days. 15 capsule 0     No current facility-administered medications for this visit.       I have reviewed and (if needed) updated the patient's problem list, medications, allergies, past medical and surgical history, social and family history.    ROS:     Unless otherwise stated in the HPI:  CONSTITUTIONAL: no fever chills.   HEENT: Eyes: No diplopia or blurred vision. ENT: No earache, sore throat or runny nose.   CARDIOVASCULAR: No pressure, squeezing, strangling, tightness, heaviness or aching about the chest, neck, axilla or epigastrium.   RESPIRATORY: No cough, shortness of breath, PND or orthopnea.   GASTROINTESTINAL: No nausea, vomiting or diarrhea.   GENITOURINARY: No dysuria, frequency or urgency.   MUSCULOSKELETAL: no new pains, no joint swelling or redness.   SKIN: No change in skin, hair or nails.   NEUROLOGIC: No paresthesias, fasciculations, seizures or weakness.   PSYCHIATRIC: No disorder of thought or mood.   ENDOCRINE: No heat or cold intolerance, polyuria or polydipsia.  HEMATOLOGICAL: No easy bruising or bleeding.    Vital Signs:     Body mass index is 51.38 kg/m??. Waist Circumference: 49.5 inches    Wt Readings from Last 3 Encounters:   04/21/22 (!) 148.4 kg (327 lb 1.6 oz)   04/19/22 (!) 148.8 kg (328 lb 1.3 oz)   03/23/22 (!) 151.6 kg (334 lb 3.2 oz)     Temp Readings from Last 3 Encounters:   04/21/22 36.2 ??C (97.1 ??F)   04/19/22 36.2 ??C (97.1 ??F)   03/23/22 36.1 ??C (97 ??F) (Temporal)     BP Readings from Last 3 Encounters:   04/21/22 125/81   04/19/22 149/92   03/23/22 113/74     Pulse Readings from Last 3 Encounters:   04/21/22 87   04/19/22 81   03/23/22 79         02/09/2022     1:00 PM 04/19/2022     9:45 AM   Waist Circumference   Waist Circumference 52 inches 49.5 inches          Physical Exam:     General: well appearing, in NAD, Body mass index is 51.38 kg/m??. Ambulatory without help.  Body fat distribution: General adiposity. No supraclavicular adiposity. No dorsal adiposity. Waist Circumference: 49.5 inches     Head: normocephalic atraumatic.  Eyes: PERRLA, EOMI, Sclera WNL.   Oral pharynx: moist, no exudate, no erythema, not enlarged. Good dental hygiene. Moderate OP crowding.  Neck: supple, no LAD, no thyromegaly, no bruit.  CV: RRR no rubs or murmurs. Peripheral edema: none.  Lungs: clear bilaterally to ascultation. No wheezing.  Abdomen: soft, NT/ND. No intertrigo. Moderate pannus. No hepatomegaly.   Extremities: no clubbing, cyanosis, No edema.  Skin: no concerning lesions, rashes observed. No lipomas, moderate acanthosis nigricans.  Neuro: Alert and oriented X 3.    Labs:     Office Visit on 03/23/2022   Component Date Value Ref Range Status   ??? HCG Urine, POC 03/23/2022 Negative  Negative Final       Follow-up:     Recommend well adult check/physical in 1 year. Otherwise, follow up as below.  Return in about 3 months (around 07/20/2022) for Weight clinic F/U one slot.Marland Kitchen    Health maintenance reviewed and recommendations made based on [place] Preventative Task Force (USPTF) recommendations. Reviewed appropriate diet and exercise. Patient stated understanding and there were no barriers to learning.     I attest that I, Constance Goltz, personally documented this note while acting as scribe for Marshell Garfinkel, MD.      Constance Goltz, Scribe.  04/19/2022     The documentation recorded by the scribe accurately reflects the service I personally performed and the decisions made by me.    Marshell Garfinkel, MD

## 2022-04-18 NOTE — Unmapped (Signed)
Wendy Chen is a 41 y.o. 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 Comorbidities include hypertension, incisional hernia, hyperlipidemia, OSA (not using CPAP). 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    Pt needs to lose 120 lbs or BMI <35 to decrease risk of surgery--hernia repair. Spoke to Dr. Orvan Falconer that this degree of wt loss is probably not possible with medical management especially with her insurance not covering the GLP1s. She will discuss with Dr. Augustine Radar about bariatric surgery while we work with pts on healthy lifestyle habits and trial of medications that are covered by her insurance.     Weight Summary:  Starting weight/BMI/WC/[place]: 338 lbs, BMI 52.93, WC 52, [place] 16.5 (02/09/2022)  Target weight/goal: No goal weight. Patient wants general improvement in health and appearance.  Today's weight/BMI:  , BMI   (04/19/2022)  % Body weight loss: N/A  Today's   (04/19/2022)  Today's visit number: 2nd  Duration in program: 2 mos    Referred by: [name], MD (Surgery)    GOALS: 1)     I have reviewed the patient's medical history, lifestyle history and labs/tests.   My recommendations include the following:    Lifestyle Pattern Summary:   - See GOALS above.     Sleep: Currently sleeping 6 hours without sleep aids.    -- Recommend 7-8 hours of sleep at night.    Stress management: Current stressors: single mom of 3 kids  -- Recommend to try relaxation techniques such as breathing techniques, listening to music to help sleep at night and yoga stretching throughout the day to help control stress.     Weight gain causing medication: Gabapentin    Medication:     - Metformin: restarted 02/09/2022 at 338 lbs.  Tried  Contraindicated:  - Phentermine: d/t HTN  - Wellbutrin: d/t HTN    Obesity Surgery: Pt is open to learning more about surgery.  Provided Pt with handout to explain surgical options. Referral to bariatric surgery sent today. (Updated 02/09/2022)    Medical conditions:  Glaucoma: no  Seizures: no  Medullary thyroid cancer (personal or family hx): no  Multiple Endocrine Neoplasia: no  Palpitations/Tachycardia: no  Chest Pain: no past history of MI.   Headaches/Migraines: YES  Nephrolithiasis: no  H/o pancreatitis: no  GERD: no     Prior Surgeries:  Cholecystectomy: no  Hysterectomy: no    Birth Control Methods: None--Pt is not currently sexually active but will discuss birth control options with PCP

## 2022-04-19 ENCOUNTER — Ambulatory Visit
Admit: 2022-04-19 | Discharge: 2022-04-20 | Payer: PRIVATE HEALTH INSURANCE | Attending: Family Medicine | Primary: Family Medicine

## 2022-04-19 DIAGNOSIS — I10 Essential (primary) hypertension: Principal | ICD-10-CM

## 2022-04-19 MED FILL — HUMIRA PEN CITRATE FREE 40 MG/0.4 ML: 28 days supply | Qty: 4 | Fill #1

## 2022-04-19 NOTE — Unmapped (Addendum)
Wendy Chen is a 41 y.o. 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 Comorbidities include hypertension, incisional hernia, hyperlipidemia, OSA (not using CPAP). 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    Pt needs to lose 120 lbs or BMI <35 to decrease risk of surgery--hernia repair. Spoke to Dr. Orvan Falconer that this degree of wt loss is probably not possible with medical management especially with her insurance not covering the GLP1s. She is scheduled to see Dr. Yetta Flock with bariatric surgery 04/21/22, in the meantime we will work with pts on healthy lifestyle habits and trial of medications that are covered by her insurance. Pt is also not using any birth control methods. Pt verbalized understanding that birth control is needed to try any other anti-obesity meds.     Weight Summary:  1. Starting weight/BMI/WC/[place]: 338 lbs, BMI 52.93, WC 52, [place] 16.5 (02/09/2022)  2. Target weight/goal: No goal weight. Patient wants general improvement in health and appearance.  4. Today's weight/BMI: (!) 148.8 kg (328 lb 1.3 oz), BMI (!) 51.37 (04/19/2022)  3. %  Body Weight loss: 3%  2. Today's Waist Circumference: 49.5 inches (04/19/2022)  4. Today's visit number: 2nd  5. Duration in program: 2 months    Referred by: [name], MD (Surgery)    GOALS: 1) Limit SSB such as juice. 2) Increase physical activity by going to [employer] 3-4x/ week. 3) Increase Metformin to 1000mg  twice daily with meals.    I have reviewed the patient's medical history, lifestyle history and labs/tests.   My recommendations include the following:    Eating Pattern: Pt has made positive diet changes and is now eating out rarely. Was previously eating at work but is now eating at home due to summer break at [place].  - Discussed increasing protein intake through options such as low sugar greek yogurt, eggs, chicken.  - Recommended limiting SSB such as juice.    Physical Activity: No formal exercise currently but pt plans to return to [employer].  -- Recommended slowly increasing exercise volume/ time to avoid injury.  - Recommended scheduling personal training session with [employer].    Sleep: Currently sleeping 6 hours without sleep aids.    -- Recommend 7-8 hours of sleep at night.    Stress management: Current stressors: single mom of 3 kids  -- Recommend to try relaxation techniques such as breathing techniques, listening to music to help sleep at night and yoga stretching throughout the day to help control stress.     Weight gain causing medication: Gabapentin    Medication:    Metformin was restarted last office visit. Taking as instructed without any s/e.   INCREASE  Metformin to 1000 mg twice a day with lunch and dinner.   - Metformin: restarted 02/09/2022 at 338 lbs.  If on birth control, can consider GLP1 (currently medicaid does not cover wt loss meds). topirmate low dose may be an option if depression under great control.   Tried  Contraindicated:  - Phentermine: d/t HTN  - Wellbutrin: d/t HTN    Obesity Surgery: Pt is scheduled to see bariatric surgery 04/21/22.    Medical conditions:  Glaucoma: no  Seizures: no  Medullary thyroid cancer (personal or family hx): no  Multiple Endocrine Neoplasia: no  Palpitations/Tachycardia: no  Chest Pain: no past history of MI.   Headaches/Migraines: YES  Nephrolithiasis: no  H/o pancreatitis: no  GERD: no     Prior Surgeries:  Cholecystectomy: no  Hysterectomy: no    Obesity-associated comorbidities: hypertension, OSA (not using CPAP), hyperlipidemia  Current barriers include: single mom with 3 kids, works at [employer] Methods: None--Pt is not currently sexually active but will discuss birth control options with PCP

## 2022-04-19 NOTE — Unmapped (Addendum)
Today's BP: 149/92 is borderline. Pl follow up with PCP for tighter bp control. Also-See Overweight tab. Pt verbalized understanding and agreement with plan.     Lab Results   Component Value Date    CREATININE 0.78 12/15/2021         Continue low salt diet, regular exercise and DASH diet. Follow up as directed for HTN.

## 2022-04-19 NOTE — Unmapped (Signed)
[facility] Follow Up    Assessment/Plan:     Chief Complaint   Patient presents with   ??? Follow-up     Wt mngmt       Problem List Items Addressed This Visit        Unprioritized    Essential hypertension - Primary (Chronic)     Today's BP: 149/92 is borderline. Pl follow up with PCP for tighter bp control. Also-See Overweight tab. Pt verbalized understanding and agreement with plan.     Lab Results   Component Value Date    CREATININE 0.78 12/15/2021         Continue low salt diet, regular exercise and DASH diet. Follow up as directed for HTN.         Class 3 obesity (CMS-HCC)     Wendy Chen is a 41 y.o. 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 Comorbidities include hypertension, incisional hernia, hyperlipidemia, OSA (not using CPAP). 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    Pt needs to lose 120 lbs or BMI <35 to decrease risk of surgery--hernia repair. Spoke to Wendy Chen that this degree of wt loss is probably not possible with medical management especially with her insurance not covering the GLP1s. She is scheduled to see Wendy Chen with bariatric surgery 04/21/22, in the meantime we will work with pts on healthy lifestyle habits and trial of medications that are covered by her insurance. Pt is also not using any birth control methods. Pt verbalized understanding that birth control is needed to try any other anti-obesity meds.     Weight Summary:  1. Starting weight/BMI/WC/[place]: 338 lbs, BMI 52.93, WC 52, [place] 16.5 (02/09/2022)  2. Target weight/goal: No goal weight. Patient wants general improvement in health and appearance.  4. Today's weight/BMI: (!) 148.8 kg (328 lb 1.3 oz), BMI (!) 51.37 (04/19/2022)  3. %  Body Weight loss: 3%  2. Today's Waist Circumference: 49.5 inches (04/19/2022)  4. Today's visit number: 2nd  5. Duration in program: 2 months    Referred by: Wendy Cape, MD (Surgery)    GOALS: 1) Limit SSB such as juice. 2) Increase physical activity by going to [employer] 3-4x/ week. 3) Increase Metformin to 1000mg  twice daily with meals.    I have reviewed the patient's medical history, lifestyle history and labs/tests.   My recommendations include the following:    Eating Pattern: Pt has made positive diet changes and is now eating out rarely. Was previously eating at work but is now eating at home due to summer break at [place].  - Discussed increasing protein intake through options such as low sugar greek yogurt, eggs, chicken.  - Recommended limiting SSB such as juice.    Physical Activity: No formal exercise currently but pt plans to return to [employer].  -- Recommended slowly increasing exercise volume/ time to avoid injury.  - Recommended scheduling personal training session with [employer].    Sleep: Currently sleeping 6 hours without sleep aids.    -- Recommend 7-8 hours of sleep at night.    Stress management: Current stressors: single mom of 3 kids  -- Recommend to try relaxation techniques such as breathing techniques, listening to music to help sleep at night and yoga stretching throughout  the day to help control stress.     Weight gain causing medication: Gabapentin    Medication:    Metformin was restarted last office visit. Taking as instructed without any s/e.   INCREASE  Metformin to 1000 mg twice a day with lunch and dinner.   - Metformin: restarted 02/09/2022 at 338 lbs.  If on birth control, can consider GLP1 (currently medicaid does not cover wt loss meds). topirmate low dose may be an option if depression under great control.   Tried  Contraindicated:  - Phentermine: d/t HTN  - Wellbutrin: d/t HTN    Obesity Surgery: Pt is scheduled to see bariatric surgery 04/21/22.    Medical conditions:  Glaucoma: no  Seizures: no  Medullary thyroid cancer (personal or family hx): no  Multiple Endocrine Neoplasia: no  Palpitations/Tachycardia: no  Chest Pain: no past history of MI.   Headaches/Migraines: YES  Nephrolithiasis: no  H/o pancreatitis: no  GERD: no     Prior Surgeries:  Cholecystectomy: no  Hysterectomy: no    Obesity-associated comorbidities: hypertension, OSA (not using CPAP), hyperlipidemia  Current barriers include: single mom with 3 kids, works at [employer] Methods: None--Pt is not currently sexually active but will discuss birth control options with PCP             Return in about 3 months (around 07/20/2022) for Weight clinic F/U one slot..    I have reviewed and addressed the patient???s adherence and response to prescribed medications. I have identified patient barriers to following the proposed medication and treatment plan, and have noted opportunities to optimize healthy behaviors. I have answered the patient???s questions to satisfaction and the patient voices understanding.    Time: Greater than 50% of this encounter was spent in direct consultation with the patient in evaluation and discussing all of the above. Duration of encounter: 30 minutes.     HPI:     Wendy Chen is a 41 y.o. female who  has a past medical history of AMA age 67 (10/04/2016), Heart murmur, Hidradenitis suppurativa (2018), Hypertension, Migraine, Obesity, Postpartum depression (2007, 2012), Procreative genetic counseling (10/05/2016), Sleep apnea, and Snoring (12/08/2016). who presents today for [facility] follow up.    Weight Management History  Wt Readings from Last 6 Encounters:   04/21/22 (!) 148.4 kg (327 lb 1.6 oz)   04/19/22 (!) 148.8 kg (328 lb 1.3 oz)   03/23/22 (!) 151.6 kg (334 lb 3.2 oz)   02/09/22 (!) 153.3 kg (338 lb)   01/11/22 (!) 154.9 kg (341 lb 8 oz)   11/29/21 (!) 153 kg (337 lb 3.2 oz)         02/09/2022     1:00 PM 04/19/2022     9:45 AM Waist Circumference   Waist Circumference 52 inches 49.5 inches       Update: Pt is scheduled to see bariatric surgery 04/21/22.  Medication: Tolerating Metformin 500 mg twice daily well. Denies side effects.  Eating pattern: Pt endorses eating healthier. Eating 3 meals, main snack are pickles. Breakfast includes eggs, sausage, oatmlea. Lunch is salad with chicken and ranch, dinner is chicken with mac n cheese or vegetables. Not going out to eat regularly. Was previously eating at work but is now eating at home due to summer break at [place].  Beverages include water, orange juice.  Physical Activity: No formal exercise currently but pt plans to return to [employer].  Barrier:     Lab Results  Component Value Date    LDL 135 (H) 01/02/2020    HDL 57 01/02/2020    A1C 4.9 02/09/2022    GLU 99 12/15/2021    TSH 1.053 02/09/2022    VITDTOTAL 19.9 (L) 02/09/2022    AST 20 02/09/2022    ALT 16 02/09/2022     Past Medical/Surgical History:     Past Medical History:   Diagnosis Date   ??? AMA age 77 10/04/2016    We discussed the patient's age related risk of aneuploidy. ??We discussed screening options including serum screening and NIPT as well as diagnostic options of CVS and amniocentesis. ??We discussed risks, benefits and diagnostic capability of both CVS and amniocentesis.   -ordered genetic consult and first trimester screen  -Age 59 at [facility] -S/p genetic counseling 11/17 -Negative cfDNA -Fetal fraction 1.5% (Fetal fraction is affected by fetal aneuploidy (trisomy 13 and 18), maternal BMI, and other maternal conditions such as preexisting hypertension. In the setting of low fetal fraction (typically defined in the literature as <4%) sensitivity of cfDNA screening may be reduced Janyth Contes, 2015. Reprod Sci).     ??? Heart murmur     benign, no abx prophylaxis required, 2012 echo noted trivial TR   ??? Hidradenitis suppurativa 2018   ??? Hypertension    ??? Migraine    ??? Obesity    ??? Postpartum depression 2007, 2012   ??? Procreative genetic counseling 10/05/2016    Genetic counseling visit on 10/09/16 Aneuploidy screening/ testing: cfDNA; results negative - low fetal fraction Carrier screening: [x]  Cystic fibrosis - neg x 106 mutationss [x]  Spinal muscular atrophy - 3 copies; reduced carrier risk [x]  Hemoglobinopathy screening - normal adult hemoglobin present     ??? Sleep apnea    ??? Snoring 12/08/2016     Past Surgical History:   Procedure Laterality Date   ??? CESAREAN SECTION  2007, 2012   ??? PR CESAREAN DELIVERY ONLY N/A 04/03/2017    Procedure: CESAREAN DELIVERY ONLY;  Surgeon: Barb Merino, MD;  Location: L&D C-SECTION OR SUITES [facility];  Service: Family Planning   ??? PR REVISE MEDIAN N/CARPAL TUNNEL SURG Right 03/02/2021    Procedure: NEUROPLASTY AND/OR TRANSPOSITION; MEDIAN NERVE AT CARPAL TUNNEL;  Surgeon: Daisy Lazar, MD;  Location: ASC OR [facility];  Service: Orthopedics   ??? PR REVISE MEDIAN N/CARPAL TUNNEL SURG Left 03/16/2021    Procedure: NEUROPLASTY AND/OR TRANSPOSITION; MEDIAN NERVE AT CARPAL TUNNEL;  Surgeon: Theodora Blow Jacqlyn Krauss, MD;  Location: ASC OR [facility];  Service: Orthopedics   ??? WISDOM TOOTH EXTRACTION         Social History:     Social History     Socioeconomic History   ??? Marital status: Single     Spouse name: None   ??? Number of children: None   ??? Years of education: None   ??? Highest education level: None   Tobacco Use   ??? Smoking status: Never   ??? Smokeless tobacco: Never   Vaping Use   ??? Vaping Use: Never used   Substance and Sexual Activity   ??? Alcohol use: No     Alcohol/week: 0.0 standard drinks   ??? Drug use: No   Other Topics Concern   ??? Do you use sunscreen? No   ??? Tanning bed use? No   ??? Are you easily burned? No   ??? Excessive sun exposure? No   ??? Blistering sunburns? No     Social Determinants of Health  Financial Resource Strain: Low Risk    ??? Difficulty of Paying Living Expenses: Not very hard   Food Insecurity: Unknown   ??? Worried About Running Out of Food in the Last Year: Patient refused   ??? Ran Out of Food in the Last Year: Patient refused   Transportation Needs: No Transportation Needs   ??? Lack of Transportation (Medical): No   ??? Lack of Transportation (Non-Medical): No       Family History:     Family History   Problem Relation Age of Onset   ??? Diabetes Mother    ??? Hypertension Mother    ??? Hypertension Father    ??? Diabetes Father    ??? Hypertension Sister    ??? Stroke Brother    ??? Seizures Brother    ??? Heart attack Brother    ??? Hypertension Brother    ??? Cancer Maternal Uncle         bone, colon   ??? Alzheimer's disease Paternal Uncle    ??? Colon cancer Paternal Uncle    ??? Heart murmur Maternal Grandmother    ??? Diabetes Maternal Grandmother    ??? Diabetes Maternal Grandfather    ??? Heart murmur Maternal Grandfather    ??? Anesthesia problems Neg Hx    ??? Bleeding Disorder Neg Hx    ??? Melanoma Neg Hx    ??? Basal cell carcinoma Neg Hx    ??? Squamous cell carcinoma Neg Hx        Allergies:     Patient has no known allergies.    Current Medications:     Current Outpatient Medications   Medication Sig Dispense Refill   ??? ascorbic acid, vitamin C, (VITAMIN C) 500 MG tablet Take 1 tablet (500 mg total) by mouth daily. 14 tablet 0   ??? chlorthalidone (HYGROTON) 50 MG tablet Take 1 tablet (50 mg total) by mouth daily. 90 tablet 3   ??? empty container Misc Use as directed to dispose of Humira pens. 1 each 2   ??? HUMIRA PEN CITRATE FREE 40 MG/0.4 ML Inject the contents of 1 pen (40mg ) under the skin weekly as maintenance. 4 each 3   ??? HUMIRA PEN CITRATE FREE STARTER PACK FOR CROHN'S/UC/HS 3 X 80 MG/0.8 ML Inject the contents of 2 pens (160mg ) under the skin on day 1, THEN inject 1 pen (80mg ) on day 15. 3 each 0   ??? ibuprofen (MOTRIN) 800 MG tablet Take 1 tablet 3 or 4 times a day as needed for pain and inflammation. Take with food to prevent stomach problems. 60 tablet 0   ??? metFORMIN (GLUCOPHAGE-XR) 500 MG 24 hr tablet Take 1 tablet (500 mg total) by mouth Two (2) times a day. 120 tablet 3   ??? norethindrone (ORTHO MICRONOR) 0.35 mg tablet Take 1 tablet by mouth daily. 84 tablet 3   ??? gabapentin (NEURONTIN) 100 MG capsule Take 1 capsule (100 mg total) by mouth Three (3) times a day for 5 days. 15 capsule 0     No current facility-administered medications for this visit.       I have reviewed and (if needed) updated the patient's problem list, medications, allergies, past medical and surgical history, social and family history.    ROS:     Unless otherwise stated in the HPI:  CONSTITUTIONAL: no fever chills.   HEENT: Eyes: No diplopia or blurred vision. ENT: No earache, sore throat or runny nose.   CARDIOVASCULAR: No pressure, squeezing, strangling, tightness,  heaviness or aching about the chest, neck, axilla or epigastrium.   RESPIRATORY: No cough, shortness of breath, PND or orthopnea.   GASTROINTESTINAL: No nausea, vomiting or diarrhea.   GENITOURINARY: No dysuria, frequency or urgency.   MUSCULOSKELETAL: no new pains, no joint swelling or redness.   SKIN: No change in skin, hair or nails.   NEUROLOGIC: No paresthesias, fasciculations, seizures or weakness.   PSYCHIATRIC: No disorder of thought or mood.   ENDOCRINE: No heat or cold intolerance, polyuria or polydipsia.   HEMATOLOGICAL: No easy bruising or bleeding.    Vital Signs:     Body mass index is 51.38 kg/m??. Waist Circumference: 49.5 inches    Wt Readings from Last 3 Encounters:   04/21/22 (!) 148.4 kg (327 lb 1.6 oz)   04/19/22 (!) 148.8 kg (328 lb 1.3 oz)   03/23/22 (!) 151.6 kg (334 lb 3.2 oz)     Temp Readings from Last 3 Encounters:   04/21/22 36.2 ??C (97.1 ??F)   04/19/22 36.2 ??C (97.1 ??F)   03/23/22 36.1 ??C (97 ??F) (Temporal)     BP Readings from Last 3 Encounters:   04/21/22 125/81   04/19/22 149/92   03/23/22 113/74     Pulse Readings from Last 3 Encounters:   04/21/22 87   04/19/22 81   03/23/22 79         02/09/2022     1:00 PM 04/19/2022     9:45 AM   Waist Circumference   Waist Circumference 52 inches 49.5 inches          Physical Exam: General: well appearing, in NAD, Body mass index is 51.38 kg/m??. Ambulatory without help.  Body fat distribution: General adiposity. No supraclavicular adiposity. No dorsal adiposity. Waist Circumference: 49.5 inches     Head: normocephalic atraumatic.  Eyes: PERRLA, EOMI, Sclera WNL.   Oral pharynx: moist, no exudate, no erythema, not enlarged. Good dental hygiene. Moderate OP crowding.  Neck: supple, no LAD, no thyromegaly, no bruit.  CV: RRR no rubs or murmurs. Peripheral edema: none.  Lungs: clear bilaterally to ascultation. No wheezing.  Abdomen: soft, NT/ND. No intertrigo. Moderate pannus. No hepatomegaly.   Extremities: no clubbing, cyanosis, No edema.  Skin: no concerning lesions, rashes observed. No lipomas, mild acanthosis nigricans.  Neuro: Alert and oriented X 3.    Labs:     Office Visit on 03/23/2022   Component Date Value Ref Range Status   ??? HCG Urine, POC 03/23/2022 Negative  Negative Final       Follow-up:     Recommend well adult check/physical in 1 year. Otherwise, follow up as below.  Return in about 3 months (around 07/20/2022) for Weight clinic F/U one slot.Marland Kitchen    Health maintenance reviewed and recommendations made based on [place] Preventative Task Force (USPTF) recommendations. Reviewed appropriate diet and exercise. Patient stated understanding and there were no barriers to learning.     @SIGN @    I attest that I, [employer] [place], personally documented this note while acting as a scribe for [employer], MD.     [place], Scribe.     The documentation recorded by the scribe accurately reflects the service I personally performed and the decisions made by me.     Marshell Garfinkel, MD

## 2022-04-21 ENCOUNTER — Ambulatory Visit
Admit: 2022-04-21 | Discharge: 2022-04-22 | Payer: PRIVATE HEALTH INSURANCE | Attending: Student in an Organized Health Care Education/Training Program | Primary: Student in an Organized Health Care Education/Training Program

## 2022-04-21 NOTE — Unmapped (Signed)
Thank you for your interest in the Bariatric Program at [facility]. We are happy that you have made the first step at improving your health and look forward to working with you.          Our surgical path starts with an intake clinic appointment with a dietitian and bariatric nurse practitioner at our [facility] location to discuss the surgical program requirements.      Group Nutrition appointment, with dietitian follow-up 4 weeks after group appointment  Psychologist appointment, to help determine if you are emotionally prepared to make lifelong lifestyle changes  Additional follow up with the dietitian and/or nurse practitioner as needed; working to ensure that you have or will have the necessary support system around you as you proceed through treatments  Additional program visits will be scheduled as needed.    You will receive a call from our scheduler to be scheduled for your appointments.        If you have any questions regarding your visit today please feel free to contact me at the number listed below.     Eldridge Dace, RN, Bariatric Coordinator  Surgical RN Coordinator for Dr. Rowe Clack   [email or web address]  Phone:  [phone]  Fax:       [phone]

## 2022-04-21 NOTE — Unmapped (Addendum)
BARIATRIC SURGERY OUTPATIENT NOTE     PRIMARY CARE PROVIDER: Fayne Mediate, MD     REFERRING PROVIDER: Riley Kill, MD  [address]  [place],  [place] [address]     HISTORY     HISTORY OF PRESENT ILLNESS: Wendy Chen is 41 y.o. female with history of HTN, HLD, HS, depression, OSA not on CPAP, and ventral hernia who is seen in consultation at the request of the above providers for assistance in the management of morbid obesity. The patient has had long-standing obesity that has been refractory to nonsurgical weight loss efforts including diets and exercise. She requests evaluation for candidacy for bariatric surgery. This patient reports history of OSA but unable to tolerate nightly CPAP machine. She reports remote history of reflux with pregnancy but otherwise no history of GERD. She endorses presence of ventral hernia that she states occasionally bulges but is reducible and without pain or skin changes. She does not take home PPI or NSAIDS. She confirmed surgical history significant for cesarean delivery x3 and no other history of abdominal surgery.    Past Medical History:   Diagnosis Date    AMA age 57 10/04/2016    We discussed the patient's age related risk of aneuploidy.  We discussed screening options including serum screening and NIPT as well as diagnostic options of CVS and amniocentesis.  We discussed risks, benefits and diagnostic capability of both CVS and amniocentesis.   -ordered genetic consult and first trimester screen  -Age 26 at [facility] -S/p genetic counseling 11/17 -Negative cfDNA -Fetal fraction 1.5% (Fetal fraction is affected by fetal aneuploidy (trisomy 13 and 18), maternal BMI, and other maternal conditions such as preexisting hypertension. In the setting of low fetal fraction (typically defined in the literature as <4%) sensitivity of cfDNA screening may be reduced Janyth Contes, 2015. Reprod Sci).      Heart murmur     benign, no abx prophylaxis required, 2012 echo noted trivial TR Hidradenitis suppurativa 2018    Hypertension     Migraine     Obesity     Postpartum depression 2007, 2012    Procreative genetic counseling 10/05/2016    Genetic counseling visit on 10/09/16 Aneuploidy screening/ testing: cfDNA; results negative - low fetal fraction Carrier screening: [x]  Cystic fibrosis - neg x 106 mutationss [x]  Spinal muscular atrophy - 3 copies; reduced carrier risk [x]  Hemoglobinopathy screening - normal adult hemoglobin present      Sleep apnea     Snoring 12/08/2016     Medications:    Current Outpatient Medications:     ascorbic acid, vitamin C, (VITAMIN C) 500 MG tablet, Take 1 tablet (500 mg total) by mouth daily., Disp: 14 tablet, Rfl: 0    chlorthalidone (HYGROTON) 50 MG tablet, Take 1 tablet (50 mg total) by mouth daily., Disp: 90 tablet, Rfl: 3    empty container Misc, Use as directed to dispose of Humira pens., Disp: 1 each, Rfl: 2    HUMIRA PEN CITRATE FREE 40 MG/0.4 ML, Inject the contents of 1 pen (40mg ) under the skin weekly as maintenance., Disp: 4 each, Rfl: 3    HUMIRA PEN CITRATE FREE STARTER PACK FOR CROHN'S/UC/HS 3 X 80 MG/0.8 ML, Inject the contents of 2 pens (160mg ) under the skin on day 1, THEN inject 1 pen (80mg ) on day 15., Disp: 3 each, Rfl: 0    ibuprofen (MOTRIN) 800 MG tablet, Take 1 tablet 3 or 4 times a day as needed for  pain and inflammation. Take with food to prevent stomach problems., Disp: 60 tablet, Rfl: 0    metFORMIN (GLUCOPHAGE-XR) 500 MG 24 hr tablet, Take 1 tablet (500 mg total) by mouth Two (2) times a day., Disp: 120 tablet, Rfl: 3    norethindrone (ORTHO MICRONOR) 0.35 mg tablet, Take 1 tablet by mouth daily., Disp: 84 tablet, Rfl: 3    gabapentin (NEURONTIN) 100 MG capsule, Take 1 capsule (100 mg total) by mouth Three (3) times a day for 5 days., Disp: 15 capsule, Rfl: 0    No Known Allergies     Past Surgical History:   Procedure Laterality Date    CESAREAN SECTION  2007, 2012    PR CESAREAN DELIVERY ONLY N/A 04/03/2017    Procedure: CESAREAN DELIVERY ONLY;  Surgeon: Barb Merino, MD;  Location: [facility] C-SECTION OR SUITES [facility];  Service: Family Planning    PR REVISE MEDIAN N/CARPAL TUNNEL SURG Right 03/02/2021    Procedure: NEUROPLASTY AND/OR TRANSPOSITION; MEDIAN NERVE AT CARPAL TUNNEL;  Surgeon: Daisy Lazar, MD;  Location: [facility] OR [facility];  Service: Orthopedics    PR REVISE MEDIAN N/CARPAL TUNNEL SURG Left 03/16/2021    Procedure: NEUROPLASTY AND/OR TRANSPOSITION; MEDIAN NERVE AT CARPAL TUNNEL;  Surgeon: Daisy Lazar, MD;  Location: [facility] OR [facility];  Service: Orthopedics    WISDOM TOOTH EXTRACTION       Family History: Her family history includes Alzheimer's disease in her paternal uncle; Cancer in her maternal uncle; Colon cancer in her paternal uncle; Diabetes in her father, maternal grandfather, maternal grandmother, and mother; Heart attack in her brother; Heart murmur in her maternal grandfather and maternal grandmother; Hypertension in her brother, father, mother, and sister; Seizures in her brother; Stroke in her brother. No coagulation or anesthesia-related complications.     Social History: She reports that she has never smoked. She has never used smokeless tobacco. She reports that she does not drink alcohol and does not use drugs.    Review of Systems: Ten system review is negative except as per HPI and PMH.     PHYSICAL EXAMINATION:      BP 125/81  - Pulse 87  - Temp 36.2 ??C (97.1 ??F)  - Ht 170.2 cm (5' 7)  - Wt (!) 148.4 kg (327 lb 1.6 oz)  - LMP 02/28/2022  - BMI 51.23 kg/m??      HEENT: Normal.    NECK: No adenopathy.  LUNGS: Clear.   Heart: RRR.   ABDOMEN: Obese, soft, non-tender.  +lower abdominal hernia consistent with known defect.  No overlying skin changes.  EXTREMITIES: Benign.   NEUROLOGIC: Grossly nonfocal.      IMPRESSION: Morbid obesity with co-morbidities as above.      PLAN: This patient meets BMI criteria for evaluation for bariatric surgery.     In the visit today, we discussed behavioral, nutritional, medical and surgical strategies for weight loss. We reviewed the most commonly performed bariatric procedures, and emphasized the dietary, behavioral and lifestyle changes associated with each. We also reviewed the comparative risks of morbid obesity and the different treatments.     After this discussion, we recommend full evaluation in the bariatric intake clinic in the near future. During that visit the patient will have a full medical, nutritional and behavioral evaluation and will initiate a tailored plan of follow-up with the goal of supervised weight loss efforts in addition to recommended evaluation for prospective bariatric surgery. We will have a longitudinal assessment of candidacy for  bariatric surgery along these same domains, and will be able to make tailored treatment recommendations and optimize preoperative readiness. We will plan for upper GI endoscopy with Dr. Yetta Flock in 6-7 weeks.    Cristal Ford, MS4  Apr 21, 2022 11:17 AM    Attestation    I attest that I have reviewed the student note and that the components of the history of the present illness, the physical exam, and the assessment and plan documented were performed by me or were performed in my presence by the student where I verified the documentation and performed (or re-performed) the exam and medical decision making.     Wendy Chen is a very pleasant 41 y.o. Female with PMH of heart murmur, hidradenitis, HTN, migraine, obesity, postpartum depression, OSA (AHI 20.8), and a known small bowel containing hernia at the site of a prior pfannenstiel incision, who presents for evaluation of bariatric surgery candidacy.  She is highly motivated and interested in pursuing weight loss surgery, and meets criteria for benefiting from bariatric surgery given her BMI of Body mass index is 51.23 kg/m??.  in clinic today.   We reviewed her PMH and PSH, as well as the options available to her  (laparoscopic vs robotic sleeve gastrectomy or laparoscopic vs robotic gastric bypass). We did discuss that the ability to perform a gastric bypass safely may be impaired by adhesions associated with her prior surgeries, but this would not be known until the time of surgery We discussed today that given her known history of OSA, we would recommend use of CPAP therapy in order to mitigate her risk of perioperative apnea, hypoxia, unplanned reintubation, transfer to a higher level of care, and mortality.  She has expressed interest in referral to sleep medicine to rediscuss optimizing her CPAP use. We also discussed that we will arrange an EGD for her with me 7/11 in order to evaluate her for hiatal hernia, h. pylori, and for reflux related changes to the lower esophagus given their history of longstanding GERD.  These findings may help [place] inform the surgical options available to her as she progresses through the weight loss program.  Her questions were answered, and I look forward to seeing her as we discuss surgical options in the near future.    Arman Filter, MD, MPH  April 28, 2022 3:36 PM

## 2022-04-25 NOTE — Unmapped (Deleted)
[facility] Follow Up    Assessment/Plan:     Chief Complaint   Patient presents with   ??? Follow-up     Wt mngmt         Problem List Items Addressed This Visit        Unprioritized    Essential hypertension - Primary (Chronic)     Today's BP: 149/92 is borderline. Pl follow up with PCP for tighter bp control. Also-See Overweight tab. Pt verbalized understanding and agreement with plan.     Lab Results   Component Value Date    CREATININE 0.78 12/15/2021         Continue low salt diet, regular exercise and DASH diet. Follow up as directed for HTN.         Class 3 obesity (CMS-HCC)     Wendy Chen is a 41 y.o. 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 Comorbidities include hypertension, incisional hernia, hyperlipidemia, OSA (not using CPAP). 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    Pt needs to lose 120 lbs or BMI <35 to decrease risk of surgery--hernia repair. Spoke to Dr. Orvan Falconer that this degree of wt loss is probably not possible with medical management especially with her insurance not covering the GLP1s. She is scheduled to see Dr. Yetta Flock with bariatric surgery 04/21/22, in the meantime we will work with pts on healthy lifestyle habits and trial of medications that are covered by her insurance. Pt is also not using any birth control methods. Pt verbalized understanding that birth control is needed to try any other anti-obesity meds.     Weight Summary:  1. Starting weight/BMI/WC/[place]: 338 lbs, BMI 52.93, WC 52, [place] 16.5 (02/09/2022)  2. Target weight/goal: No goal weight. Patient wants general improvement in health and appearance.  4. Today's weight/BMI: (!) 148.8 kg (328 lb 1.3 oz), BMI (!) 51.37 (04/19/2022)  3. %  Body Weight loss: 3%  2. Today's Waist Circumference: 49.5 inches (04/19/2022)  4. Today's visit number: 2nd  5. Duration in program: 2 months    Referred by: [name], MD (Surgery)    GOALS: 1) Limit SSB such as juice. 2) Increase physical activity by going to [employer] 3-4x/ week. 3) Increase Metformin to 1000mg  twice daily with meals.    I have reviewed the patient's medical history, lifestyle history and labs/tests.   My recommendations include the following:    Eating Pattern: Pt has made positive diet changes and is now eating out rarely. Was previously eating at work but is now eating at home due to summer break at [employer].  - Discussed increasing protein intake through options such as low sugar greek yogurt, eggs, chicken.  - Recommended limiting SSB such as juice.    Physical Activity: No formal exercise currently but pt plans to return to [employer].  -- Recommended slowly increasing exercise volume/ time to avoid injury.  - Recommended scheduling personal training session with [employer].    Sleep: Currently sleeping 6 hours without sleep aids.    -- Recommend 7-8 hours of sleep at night.    Stress management: Current stressors: single mom of 3 kids  -- Recommend to try relaxation techniques such as breathing techniques, listening to music to help sleep at night and yoga  stretching throughout the day to help control stress.     Weight gain causing medication: Gabapentin    Medication:    Metformin was restarted last office visit. Taking as instructed without any s/e.   INCREASE  Metformin to 1000 mg twice a day with lunch and dinner.   - Metformin: restarted 02/09/2022 at 338 lbs.  If on birth control, can consider GLP1 (currently medicaid does not cover wt loss meds). topirmate low dose may be an option if depression under great control.   Tried  Contraindicated:  - Phentermine: d/t HTN  - Wellbutrin: d/t HTN    Obesity Surgery: Pt is scheduled to see bariatric surgery 04/21/22.    Medical conditions:  Glaucoma: no  Seizures: no  Medullary thyroid cancer (personal or family hx): no  Multiple Endocrine Neoplasia: no  Palpitations/Tachycardia: no  Chest Pain: no past history of MI.   Headaches/Migraines: YES  Nephrolithiasis: no  H/o pancreatitis: no  GERD: no     Prior Surgeries:  Cholecystectomy: no  Hysterectomy: no    Obesity-associated comorbidities: hypertension, OSA (not using CPAP), hyperlipidemia  Current barriers include: single mom with 3 kids, works at [employer] Methods: None--Pt is not currently sexually active but will discuss birth control options with PCP               Return in about 3 months (around 07/20/2022) for Weight clinic F/U one slot..    I have reviewed and addressed the patient???s adherence and response to prescribed medications. I have identified patient barriers to following the proposed medication and treatment plan, and have noted opportunities to optimize healthy behaviors. I have answered the patient???s questions to satisfaction and the patient voices understanding.    Time: Greater than 50% of this encounter was spent in direct consultation with the patient in evaluation and discussing all of the above. Duration of encounter: 30 minutes.     HPI:     Wendy Chen is a 41 y.o. female who  has a past medical history of AMA age 41 (10/04/2016), Heart murmur, Hidradenitis suppurativa (2018), Hypertension, Migraine, Obesity, Postpartum depression (2007, 2012), Procreative genetic counseling (10/05/2016), Sleep apnea, and Snoring (12/08/2016). who presents today for [facility] follow up.    Weight Management History  Wt Readings from Last 6 Encounters:   04/21/22 (!) 148.4 kg (327 lb 1.6 oz)   04/19/22 (!) 148.8 kg (328 lb 1.3 oz)   03/23/22 (!) 151.6 kg (334 lb 3.2 oz)   02/09/22 (!) 153.3 kg (338 lb)   01/11/22 (!) 154.9 kg (341 lb 8 oz)   11/29/21 (!) 153 kg (337 lb 3.2 oz)         02/09/2022     1:00 PM 04/19/2022     9:45 AM Waist Circumference   Waist Circumference 52 inches 49.5 inches         Update: doing better. Has appt with bariatric surgery in May  Medication: metformin. tol well without side effects.   Eating pattern: Pt has made positive diet changes and is now eating out rarely. Was previously eating at work but is now eating at home due to summer break at [place] [employer].  Physical Activity: No formal exercise currently but pt plans to return to [employer]: hernia, class 3 obesity. Single mom with three kids.     Lab Results   Component Value Date    LDL 135 (H) 01/02/2020    HDL 57 01/02/2020  A1C 4.9 02/09/2022    GLU 99 12/15/2021    TSH 1.053 02/09/2022    VITDTOTAL 19.9 (L) 02/09/2022    AST 20 02/09/2022    ALT 16 02/09/2022     Past Medical/Surgical History:     Past Medical History:   Diagnosis Date   ??? AMA age 51 10/04/2016    We discussed the patient's age related risk of aneuploidy. ??We discussed screening options including serum screening and NIPT as well as diagnostic options of CVS and amniocentesis. ??We discussed risks, benefits and diagnostic capability of both CVS and amniocentesis.   -ordered genetic consult and first trimester screen  -Age 89 at [facility] -S/p genetic counseling 11/17 -Negative cfDNA -Fetal fraction 1.5% (Fetal fraction is affected by fetal aneuploidy (trisomy 13 and 18), maternal BMI, and other maternal conditions such as preexisting hypertension. In the setting of low fetal fraction (typically defined in the literature as <4%) sensitivity of cfDNA screening may be reduced Janyth Contes, 2015. Reprod Sci).     ??? Heart murmur     benign, no abx prophylaxis required, 2012 echo noted trivial TR   ??? Hidradenitis suppurativa 2018   ??? Hypertension    ??? Migraine    ??? Obesity    ??? Postpartum depression 2007, 2012   ??? Procreative genetic counseling 10/05/2016    Genetic counseling visit on 10/09/16 Aneuploidy screening/ testing: cfDNA; results negative - low fetal fraction Carrier screening: [x]  Cystic fibrosis - neg x 106 mutationss [x]  Spinal muscular atrophy - 3 copies; reduced carrier risk [x]  Hemoglobinopathy screening - normal adult hemoglobin present     ??? Sleep apnea    ??? Snoring 12/08/2016     Past Surgical History:   Procedure Laterality Date   ??? CESAREAN SECTION  2007, 2012   ??? PR CESAREAN DELIVERY ONLY N/A 04/03/2017    Procedure: CESAREAN DELIVERY ONLY;  Surgeon: Barb Merino, MD;  Location: L&D C-SECTION OR SUITES [facility];  Service: Family Planning   ??? PR REVISE MEDIAN N/CARPAL TUNNEL SURG Right 03/02/2021    Procedure: NEUROPLASTY AND/OR TRANSPOSITION; MEDIAN NERVE AT CARPAL TUNNEL;  Surgeon: Daisy Lazar, MD;  Location: ASC OR [facility];  Service: Orthopedics   ??? PR REVISE MEDIAN N/CARPAL TUNNEL SURG Left 03/16/2021    Procedure: NEUROPLASTY AND/OR TRANSPOSITION; MEDIAN NERVE AT CARPAL TUNNEL;  Surgeon: Theodora Blow Jacqlyn Krauss, MD;  Location: ASC OR [facility];  Service: Orthopedics   ??? WISDOM TOOTH EXTRACTION         Social History:     Social History     Socioeconomic History   ??? Marital status: Single     Spouse name: None   ??? Number of children: None   ??? Years of education: None   ??? Highest education level: None   Tobacco Use   ??? Smoking status: Never   ??? Smokeless tobacco: Never   Vaping Use   ??? Vaping Use: Never used   Substance and Sexual Activity   ??? Alcohol use: No     Alcohol/week: 0.0 standard drinks   ??? Drug use: No   Other Topics Concern   ??? Do you use sunscreen? No   ??? Tanning bed use? No   ??? Are you easily burned? No   ??? Excessive sun exposure? No   ??? Blistering sunburns? No     Social Determinants of Health     Financial Resource Strain: Low Risk    ??? Difficulty of Paying Living Expenses: Not very hard  Food Insecurity: Unknown   ??? Worried About Programme researcher, broadcasting/film/video in the Last Year: Patient refused   ??? Ran Out of Food in the Last Year: Patient refused   Transportation Needs: No Transportation Needs   ??? Lack of Transportation (Medical): No   ??? Lack of Transportation (Non-Medical): No       Family History:     Family History   Problem Relation Age of Onset   ??? Diabetes Mother    ??? Hypertension Mother    ??? Hypertension Father    ??? Diabetes Father    ??? Hypertension Sister    ??? Stroke Brother    ??? Seizures Brother    ??? Heart attack Brother    ??? Hypertension Brother    ??? Cancer Maternal Uncle         bone, colon   ??? Alzheimer's disease Paternal Uncle    ??? Colon cancer Paternal Uncle    ??? Heart murmur Maternal Grandmother    ??? Diabetes Maternal Grandmother    ??? Diabetes Maternal Grandfather    ??? Heart murmur Maternal Grandfather    ??? Anesthesia problems Neg Hx    ??? Bleeding Disorder Neg Hx    ??? Melanoma Neg Hx    ??? Basal cell carcinoma Neg Hx    ??? Squamous cell carcinoma Neg Hx        Allergies:     Patient has no known allergies.    Current Medications:     Current Outpatient Medications   Medication Sig Dispense Refill   ??? ascorbic acid, vitamin C, (VITAMIN C) 500 MG tablet Take 1 tablet (500 mg total) by mouth daily. 14 tablet 0   ??? chlorthalidone (HYGROTON) 50 MG tablet Take 1 tablet (50 mg total) by mouth daily. 90 tablet 3   ??? empty container Misc Use as directed to dispose of Humira pens. 1 each 2   ??? HUMIRA PEN CITRATE FREE 40 MG/0.4 ML Inject the contents of 1 pen (40mg ) under the skin weekly as maintenance. 4 each 3   ??? HUMIRA PEN CITRATE FREE STARTER PACK FOR CROHN'S/UC/HS 3 X 80 MG/0.8 ML Inject the contents of 2 pens (160mg ) under the skin on day 1, THEN inject 1 pen (80mg ) on day 15. 3 each 0   ??? ibuprofen (MOTRIN) 800 MG tablet Take 1 tablet 3 or 4 times a day as needed for pain and inflammation. Take with food to prevent stomach problems. 60 tablet 0   ??? metFORMIN (GLUCOPHAGE-XR) 500 MG 24 hr tablet Take 1 tablet (500 mg total) by mouth Two (2) times a day. 120 tablet 3   ??? norethindrone (ORTHO MICRONOR) 0.35 mg tablet Take 1 tablet by mouth daily. 84 tablet 3   ??? gabapentin (NEURONTIN) 100 MG capsule Take 1 capsule (100 mg total) by mouth Three (3) times a day for 5 days. 15 capsule 0     No current facility-administered medications for this visit.       I have reviewed and (if needed) updated the patient's problem list, medications, allergies, past medical and surgical history, social and family history.    ROS:     Unless otherwise stated in the HPI:  CONSTITUTIONAL: no fever chills.   HEENT: Eyes: No diplopia or blurred vision. ENT: No earache, sore throat or runny nose.   CARDIOVASCULAR: No pressure, squeezing, strangling, tightness, heaviness or aching about the chest, neck, axilla or epigastrium.   RESPIRATORY: No cough, shortness of breath, PND  or orthopnea.   GASTROINTESTINAL: No nausea, vomiting or diarrhea.   GENITOURINARY: No dysuria, frequency or urgency.   MUSCULOSKELETAL: no new pains, no joint swelling or redness.   SKIN: No change in skin, hair or nails.   NEUROLOGIC: No paresthesias, fasciculations, seizures or weakness.   PSYCHIATRIC: No disorder of thought or mood.   ENDOCRINE: No heat or cold intolerance, polyuria or polydipsia.   HEMATOLOGICAL: No easy bruising or bleeding.    Vital Signs:     Body mass index is 51.38 kg/m??. Waist Circumference: 49.5 inches    Wt Readings from Last 3 Encounters:   04/21/22 (!) 148.4 kg (327 lb 1.6 oz)   04/19/22 (!) 148.8 kg (328 lb 1.3 oz)   03/23/22 (!) 151.6 kg (334 lb 3.2 oz)     Temp Readings from Last 3 Encounters:   04/21/22 36.2 ??C (97.1 ??F)   04/19/22 36.2 ??C (97.1 ??F)   03/23/22 36.1 ??C (97 ??F) (Temporal)     BP Readings from Last 3 Encounters:   04/21/22 125/81   04/19/22 149/92   03/23/22 113/74     Pulse Readings from Last 3 Encounters:   04/21/22 87   04/19/22 81   03/23/22 79         02/09/2022     1:00 PM 04/19/2022     9:45 AM   Waist Circumference   Waist Circumference 52 inches 49.5 inches          Physical Exam:     General: well appearing, in NAD, Body mass index is 51.38 kg/m??. Ambulatory without help.  Body fat distribution: General adiposity. No supraclavicular adiposity. No dorsal adiposity. Waist Circumference: 49.5 inches     Head: normocephalic atraumatic.  Eyes: PERRLA, EOMI, Sclera WNL.   Oral pharynx: moist, no exudate, no erythema, not enlarged. Good dental hygiene. Moderate OP crowding.  Neck: supple, no LAD, no thyromegaly, no bruit.  CV: RRR no rubs or murmurs. Peripheral edema: none.  Lungs: clear bilaterally to ascultation. No wheezing.  Abdomen: soft, NT/ND. No intertrigo. Moderate pannus. No hepatomegaly.   Extremities: no clubbing, cyanosis, No edema.  Skin: no concerning lesions, rashes observed. No lipomas, moderate acanthosis nigricans.  Neuro: Alert and oriented X 3.    Labs:     Office Visit on 03/23/2022   Component Date Value Ref Range Status   ??? HCG Urine, POC 03/23/2022 Negative  Negative Final       Follow-up:     Recommend well adult check/physical in 1 year. Otherwise, follow up as below.  Return in about 3 months (around 07/20/2022) for Weight clinic F/U one slot.Marland Kitchen    Health maintenance reviewed and recommendations made based on [place] Preventative Task Force (USPTF) recommendations. Reviewed appropriate diet and exercise. Patient stated understanding and there were no barriers to learning.     I attest that I, Constance Goltz, personally documented this note while acting as scribe for Marshell Garfinkel, MD.      Constance Goltz, Scribe.  04/19/2022     The documentation recorded by the scribe accurately reflects the service I personally performed and the decisions made by me.    Marshell Garfinkel, MD

## 2022-04-26 NOTE — Unmapped (Deleted)
[facility] Follow Up    Assessment/Plan:     Chief Complaint   Patient presents with   ??? Follow-up     Wt mngmt       Problem List Items Addressed This Visit        Unprioritized    Essential hypertension - Primary (Chronic)     Today's BP: 149/92 is borderline. Pl follow up with PCP for tighter bp control. Also-See Overweight tab. Pt verbalized understanding and agreement with plan.     Lab Results   Component Value Date    CREATININE 0.78 12/15/2021         Continue low salt diet, regular exercise and DASH diet. Follow up as directed for HTN.         Class 3 obesity (CMS-HCC)     Wendy Chen is a 41 y.o. 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 Comorbidities include hypertension, incisional hernia, hyperlipidemia, OSA (not using CPAP). 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    Pt needs to lose 120 lbs or BMI <35 to decrease risk of surgery--hernia repair. Spoke to Dr. Orvan Falconer that this degree of wt loss is probably not possible with medical management especially with her insurance not covering the GLP1s. She is scheduled to see Dr. Yetta Flock with bariatric surgery 04/21/22, in the meantime we will work with pts on healthy lifestyle habits and trial of medications that are covered by her insurance. Pt is also not using any birth control methods. Pt verbalized understanding that birth control is needed to try any other anti-obesity meds.     Weight Summary:  1. Starting weight/BMI/WC/Parole: 338 lbs, BMI 52.93, WC 52, Dowell 16.5 (02/09/2022)  2. Target weight/goal: No goal weight. Patient wants general improvement in health and appearance.  4. Today's weight/BMI: (!) 148.8 kg (328 lb 1.3 oz), BMI (!) 51.37 (04/19/2022)  3. %  Body Weight loss: 3%  2. Today's Waist Circumference: 49.5 inches (04/19/2022)  4. Today's visit number: 2nd  5. Duration in program: 2 months    Referred by: [name], MD (Surgery)    GOALS: 1) Limit SSB such as juice. 2) Increase physical activity by going to [employer] 3-4x/ week. 3) Increase Metformin to 1000mg  twice daily with meals.    I have reviewed the patient's medical history, lifestyle history and labs/tests.   My recommendations include the following:    Eating Pattern: Pt has made positive diet changes and is now eating out rarely. Was previously eating at work but is now eating at home due to summer break at [employer].  - Discussed increasing protein intake through options such as low sugar greek yogurt, eggs, chicken.  - Recommended limiting SSB such as juice.    Physical Activity: No formal exercise currently but pt plans to return to [employer].  -- Recommended slowly increasing exercise volume/ time to avoid injury.  - Recommended scheduling personal training session with [employer].    Sleep: Currently sleeping 6 hours without sleep aids.    -- Recommend 7-8 hours of sleep at night.    Stress management: Current stressors: single mom of 3 kids  -- Recommend to try relaxation techniques such as breathing techniques, listening to music to help sleep at night and yoga stretching throughout  the day to help control stress.     Weight gain causing medication: Gabapentin    Medication:    Metformin was restarted last office visit. Taking as instructed without any s/e.   INCREASE  Metformin to 1000 mg twice a day with lunch and dinner.   - Metformin: restarted 02/09/2022 at 338 lbs.  If on birth control, can consider GLP1 (currently medicaid does not cover wt loss meds). topirmate low dose may be an option if depression under great control.   Tried  Contraindicated:  - Phentermine: d/t HTN  - Wellbutrin: d/t HTN    Obesity Surgery: Pt is scheduled to see bariatric surgery 04/21/22.    Medical conditions:  Glaucoma: no  Seizures: no  Medullary thyroid cancer (personal or family hx): no  Multiple Endocrine Neoplasia: no  Palpitations/Tachycardia: no  Chest Pain: no past history of MI.   Headaches/Migraines: YES  Nephrolithiasis: no  H/o pancreatitis: no  GERD: no     Prior Surgeries:  Cholecystectomy: no  Hysterectomy: no    Obesity-associated comorbidities: hypertension, OSA (not using CPAP), hyperlipidemia  Current barriers include: single mom with 3 kids, works at [employer] Methods: None--Pt is not currently sexually active but will discuss birth control options with PCP             Return in about 3 months (around 07/20/2022) for Weight clinic F/U one slot..    I have reviewed and addressed the patient???s adherence and response to prescribed medications. I have identified patient barriers to following the proposed medication and treatment plan, and have noted opportunities to optimize healthy behaviors. I have answered the patient???s questions to satisfaction and the patient voices understanding.    Time: Greater than 50% of this encounter was spent in direct consultation with the patient in evaluation and discussing all of the above. Duration of encounter: 30 minutes.     HPI:     Wendy Chen is a 41 y.o. female who  has a past medical history of AMA age 66 (10/04/2016), Heart murmur, Hidradenitis suppurativa (2018), Hypertension, Migraine, Obesity, Postpartum depression (2007, 2012), Procreative genetic counseling (10/05/2016), Sleep apnea, and Snoring (12/08/2016). who presents today for [facility] follow up.    Weight Management History  Wt Readings from Last 6 Encounters:   04/21/22 (!) 148.4 kg (327 lb 1.6 oz)   04/19/22 (!) 148.8 kg (328 lb 1.3 oz)   03/23/22 (!) 151.6 kg (334 lb 3.2 oz)   02/09/22 (!) 153.3 kg (338 lb)   01/11/22 (!) 154.9 kg (341 lb 8 oz)   11/29/21 (!) 153 kg (337 lb 3.2 oz)         02/09/2022     1:00 PM 04/19/2022     9:45 AM Waist Circumference   Waist Circumference 52 inches 49.5 inches       ***graph  Update:   Medication:   Eating pattern:   Physical Activity:   Barrier:     Lab Results   Component Value Date    LDL 135 (H) 01/02/2020    HDL 57 01/02/2020    A1C 4.9 02/09/2022    GLU 99 12/15/2021    TSH 1.053 02/09/2022    VITDTOTAL 19.9 (L) 02/09/2022    AST 20 02/09/2022    ALT 16 02/09/2022     Past Medical/Surgical History:     Past Medical History:   Diagnosis Date   ??? AMA age 58 10/04/2016    We discussed  the patient's age related risk of aneuploidy. ??We discussed screening options including serum screening and NIPT as well as diagnostic options of CVS and amniocentesis. ??We discussed risks, benefits and diagnostic capability of both CVS and amniocentesis.   -ordered genetic consult and first trimester screen  -Age 75 at [facility] -S/p genetic counseling 11/17 -Negative cfDNA -Fetal fraction 1.5% (Fetal fraction is affected by fetal aneuploidy (trisomy 13 and 18), maternal BMI, and other maternal conditions such as preexisting hypertension. In the setting of low fetal fraction (typically defined in the literature as <4%) sensitivity of cfDNA screening may be reduced Janyth Contes, 2015. Reprod Sci).     ??? Heart murmur     benign, no abx prophylaxis required, 2012 echo noted trivial TR   ??? Hidradenitis suppurativa 2018   ??? Hypertension    ??? Migraine    ??? Obesity    ??? Postpartum depression 2007, 2012   ??? Procreative genetic counseling 10/05/2016    Genetic counseling visit on 10/09/16 Aneuploidy screening/ testing: cfDNA; results negative - low fetal fraction Carrier screening: [x]  Cystic fibrosis - neg x 106 mutationss [x]  Spinal muscular atrophy - 3 copies; reduced carrier risk [x]  Hemoglobinopathy screening - normal adult hemoglobin present     ??? Sleep apnea    ??? Snoring 12/08/2016     Past Surgical History:   Procedure Laterality Date   ??? CESAREAN SECTION  2007, 2012   ??? PR CESAREAN DELIVERY ONLY N/A 04/03/2017    Procedure: CESAREAN DELIVERY ONLY;  Surgeon: Barb Merino, MD;  Location: L&D C-SECTION OR SUITES [facility];  Service: Family Planning   ??? PR REVISE MEDIAN N/CARPAL TUNNEL SURG Right 03/02/2021    Procedure: NEUROPLASTY AND/OR TRANSPOSITION; MEDIAN NERVE AT CARPAL TUNNEL;  Surgeon: Daisy Lazar, MD;  Location: [facility] OR [facility];  Service: Orthopedics   ??? PR REVISE MEDIAN N/CARPAL TUNNEL SURG Left 03/16/2021    Procedure: NEUROPLASTY AND/OR TRANSPOSITION; MEDIAN NERVE AT CARPAL TUNNEL;  Surgeon: Theodora Blow Jacqlyn Krauss, MD;  Location: [facility] OR [facility];  Service: Orthopedics   ??? WISDOM TOOTH EXTRACTION         Social History:     Social History     Socioeconomic History   ??? Marital status: Single     Spouse name: None   ??? Number of children: None   ??? Years of education: None   ??? Highest education level: None   Tobacco Use   ??? Smoking status: Never   ??? Smokeless tobacco: Never   Vaping Use   ??? Vaping Use: Never used   Substance and Sexual Activity   ??? Alcohol use: No     Alcohol/week: 0.0 standard drinks   ??? Drug use: No   Other Topics Concern   ??? Do you use sunscreen? No   ??? Tanning bed use? No   ??? Are you easily burned? No   ??? Excessive sun exposure? No   ??? Blistering sunburns? No     Social Determinants of Health     Financial Resource Strain: Low Risk    ??? Difficulty of Paying Living Expenses: Not very hard   Food Insecurity: Unknown   ??? Worried About Running Out of Food in the Last Year: Patient refused   ??? Ran Out of Food in the Last Year: Patient refused   Transportation Needs: No Transportation Needs   ??? Lack of Transportation (Medical): No   ??? Lack of Transportation (Non-Medical): No       Family History:  Family History   Problem Relation Age of Onset   ??? Diabetes Mother    ??? Hypertension Mother    ??? Hypertension Father    ??? Diabetes Father    ??? Hypertension Sister    ??? Stroke Brother    ??? Seizures Brother    ??? Heart attack Brother    ??? Hypertension Brother    ??? Cancer Maternal Uncle         bone, colon ??? Alzheimer's disease Paternal Uncle    ??? Colon cancer Paternal Uncle    ??? Heart murmur Maternal Grandmother    ??? Diabetes Maternal Grandmother    ??? Diabetes Maternal Grandfather    ??? Heart murmur Maternal Grandfather    ??? Anesthesia problems Neg Hx    ??? Bleeding Disorder Neg Hx    ??? Melanoma Neg Hx    ??? Basal cell carcinoma Neg Hx    ??? Squamous cell carcinoma Neg Hx        Allergies:     Patient has no known allergies.    Current Medications:     Current Outpatient Medications   Medication Sig Dispense Refill   ??? ascorbic acid, vitamin C, (VITAMIN C) 500 MG tablet Take 1 tablet (500 mg total) by mouth daily. 14 tablet 0   ??? chlorthalidone (HYGROTON) 50 MG tablet Take 1 tablet (50 mg total) by mouth daily. 90 tablet 3   ??? empty container Misc Use as directed to dispose of Humira pens. 1 each 2   ??? HUMIRA PEN CITRATE FREE 40 MG/0.4 ML Inject the contents of 1 pen (40mg ) under the skin weekly as maintenance. 4 each 3   ??? HUMIRA PEN CITRATE FREE STARTER PACK FOR CROHN'S/UC/HS 3 X 80 MG/0.8 ML Inject the contents of 2 pens (160mg ) under the skin on day 1, THEN inject 1 pen (80mg ) on day 15. 3 each 0   ??? ibuprofen (MOTRIN) 800 MG tablet Take 1 tablet 3 or 4 times a day as needed for pain and inflammation. Take with food to prevent stomach problems. 60 tablet 0   ??? metFORMIN (GLUCOPHAGE-XR) 500 MG 24 hr tablet Take 1 tablet (500 mg total) by mouth Two (2) times a day. 120 tablet 3   ??? norethindrone (ORTHO MICRONOR) 0.35 mg tablet Take 1 tablet by mouth daily. 84 tablet 3   ??? gabapentin (NEURONTIN) 100 MG capsule Take 1 capsule (100 mg total) by mouth Three (3) times a day for 5 days. 15 capsule 0     No current facility-administered medications for this visit.       I have reviewed and (if needed) updated the patient's problem list, medications, allergies, past medical and surgical history, social and family history.    ROS:     Unless otherwise stated in the HPI:  CONSTITUTIONAL: no fever chills.   HEENT: Eyes: No diplopia or blurred vision. ENT: No earache, sore throat or runny nose.   CARDIOVASCULAR: No pressure, squeezing, strangling, tightness, heaviness or aching about the chest, neck, axilla or epigastrium.   RESPIRATORY: No cough, shortness of breath, PND or orthopnea.   GASTROINTESTINAL: No nausea, vomiting or diarrhea.   GENITOURINARY: No dysuria, frequency or urgency.   MUSCULOSKELETAL: no new pains, no joint swelling or redness.   SKIN: No change in skin, hair or nails.   NEUROLOGIC: No paresthesias, fasciculations, seizures or weakness.   PSYCHIATRIC: No disorder of thought or mood.   ENDOCRINE: No heat or cold intolerance, polyuria or polydipsia.  HEMATOLOGICAL: No easy bruising or bleeding.    Vital Signs:     Body mass index is 51.38 kg/m??. Waist Circumference: 49.5 inches    Wt Readings from Last 3 Encounters:   04/21/22 (!) 148.4 kg (327 lb 1.6 oz)   04/19/22 (!) 148.8 kg (328 lb 1.3 oz)   03/23/22 (!) 151.6 kg (334 lb 3.2 oz)     Temp Readings from Last 3 Encounters:   04/21/22 36.2 ??C (97.1 ??F)   04/19/22 36.2 ??C (97.1 ??F)   03/23/22 36.1 ??C (97 ??F) (Temporal)     BP Readings from Last 3 Encounters:   04/21/22 125/81   04/19/22 149/92   03/23/22 113/74     Pulse Readings from Last 3 Encounters:   04/21/22 87   04/19/22 81   03/23/22 79         02/09/2022     1:00 PM 04/19/2022     9:45 AM   Waist Circumference   Waist Circumference 52 inches 49.5 inches          Physical Exam:     General: well appearing, in NAD, Body mass index is 51.38 kg/m??. Ambulatory without help.  Body fat distribution: General adiposity. No supraclavicular adiposity. No dorsal adiposity. Waist Circumference: 49.5 inches     Head: normocephalic atraumatic.  Eyes: PERRLA, EOMI, Sclera WNL.   Oral pharynx: moist, no exudate, no erythema, not enlarged. Good dental hygiene. Moderate OP crowding.  Neck: supple, no LAD, no thyromegaly, no bruit.  CV: RRR no rubs or murmurs. Peripheral edema: none.  Lungs: clear bilaterally to ascultation. No wheezing.  Abdomen: soft, NT/ND. No intertrigo. Moderate pannus. No hepatomegaly.   Extremities: no clubbing, cyanosis, No edema.  Skin: no concerning lesions, rashes observed. No lipomas, moderate acanthosis nigricans.  Neuro: Alert and oriented X 3.    Labs:     Office Visit on 03/23/2022   Component Date Value Ref Range Status   ??? HCG Urine, POC 03/23/2022 Negative  Negative Final       Follow-up:     Recommend well adult check/physical in 1 year. Otherwise, follow up as below.  Return in about 3 months (around 07/20/2022) for Weight clinic F/U one slot.Marland Kitchen    Health maintenance reviewed and recommendations made based on [place] Preventative Task Force (USPTF) recommendations. Reviewed appropriate diet and exercise. Patient stated understanding and there were no barriers to learning.     I attest that I, Constance Goltz, personally documented this note while acting as scribe for Marshell Garfinkel, MD.      Constance Goltz, Scribe.  04/19/2022     The documentation recorded by the scribe accurately reflects the service I personally performed and the decisions made by me.    Marshell Garfinkel, MD

## 2022-04-27 NOTE — Unmapped (Addendum)
Called pt LM to schedule MO ----- Message from Cyd Silence, RN sent at 04/21/2022 10:46 AM EDT -----  Patient saw Dr. Yetta Flock today.   She may move forward with the team.      Misty Stanley

## 2022-05-01 NOTE — Unmapped (Signed)
Called pt to schedule intaek appt

## 2022-05-02 NOTE — Unmapped (Signed)
Pt returned call my call for intake. Called back LM pt will need appt in June for medicaid visit count

## 2022-05-10 MED FILL — METFORMIN ER 500 MG TABLET,EXTENDED RELEASE 24 HR: ORAL | 60 days supply | Qty: 120 | Fill #1

## 2022-05-10 MED FILL — CHLORTHALIDONE 50 MG TABLET: ORAL | 90 days supply | Qty: 90 | Fill #1

## 2022-05-15 ENCOUNTER — Ambulatory Visit
Admit: 2022-05-15 | Discharge: 2022-05-16 | Payer: PRIVATE HEALTH INSURANCE | Attending: Registered" | Primary: Registered"

## 2022-05-15 NOTE — Unmapped (Unsigned)
[facility] Weight Program -- Nutrition Evaluation      Wendy Chen was seen in consultation at the request of Lynett Fish, MD for   {Blank single:19197::Weight loss to precede a ***,comprehensive evaluation for the management of worsening obesity}    CC/HPI:  Wendy Chen is a 41 y.o. female with a BMI of *** kg/m2.  Primary reason for wanting obesity treatment: physician recommendation  Motivation: improved appearance, better quality of life and improved ability to be active, and improvement in health conditions  Overall goal: hernia surgery BMI  30     Treatment of interest: lifestyle , pharmacologic, and surgical options.    Weight History:  Highest adult weight: 354 lb   Lowest adult weight on a diet or weight loss program: 284 lb  Lowest usual adult weight: 330 lb  Patients own description of the chronology of weight gain: fast food, stress     Evidence of strong genetic component: strong family history of obesity, early onset and progressive obesity, and excessive hunger since childhood    Developmental contributions:  none    Other contributors to weight gain:  n/a    Dieting History:  What diets worked for you in the past? Intermittent fasting   Maximum weight lost and how long was it maintained? 30 lbs, couple months   Are you currently working with a RD? no    Current  Lifestyle Patterns  Dietary:  Overview of typical 24 hr recall:   8 am- 11 am - eggs, sausage with tater tots or yogurt or sandwich - [place] with lettuce or bacon on white bread or Dave's everything   1-2 pm - dave's honey bar, dill pickles, sandwich   6-7 pm - spaghetti with ground [place] with tomato sauce or grilled chicken with mixed vegetables   9 pm -Snack -  pickles or nuts - almonds, pasticcio     Beverages: orange juice, sweet tea - once per week, water - 64 ounces  Alcohol: none  Food Allergies/Restrictions: not aware of   Average hours of  caloric intake :    Eating behaviors:  Excessive hunger within 1-2 hrs of having a regular meal: no  Eating when not hungry: yes, bored   Eating for comfort when stressed or emotional: yes, stressed  Times when you are eating and it literally feels like you can't stop: no  Try to manage your weight by vomiting, using laxatives, diuretics, or excessive exercise? no  Ever find evidence that you have been eating when you were asleep: no  Eat late at night or wake up at night and eat: no    Physical Activity:  Job related activity: constantly moving  Exercise regularly: yes  Exercise type / duration/ frequency: cardio/weights for  about 60 minutes every other day   What are the barriers to exercise: none    Sleep:  Average sleep duration: 7-8 hrs.   Using CPAP: no, can't get to use to the mask   Feels well rested in the morning: YES      Stress:  Perceived stress level during the last year: 4/10  Cause for stress: N/A    Co-morbidities related to obesity:   hypertension and obstructive sleep apnea      Anthropometrics:  Current Weight:   Wt Readings from Last 1 Encounters:   04/21/22 (!) 148.4 kg (327 lb 1.6 oz)     Current Height:   Ht Readings from Last 1 Encounters:   04/21/22 170.2  cm (5' 7)     Current BMI:   BMI Readings from Last 1 Encounters:   04/21/22 51.23 kg/m??      IBW    %Ideal Body Weight:      ABW:    Goal Weight (per patient): ***      Nutrition Focused Physical Exam:                             Relevant Labs:  Lab Results   Component Value Date    HGB 12.5 02/09/2022    HCT 38.1 02/09/2022    MCV 83.5 02/09/2022    VITDTOTAL 19.9 (L) 02/09/2022    A1C 4.9 02/09/2022    EAG 94 02/09/2022       Relevant Medications/Supplements:  Reviewed nutritionally relevant medications and supplements.  ***  Current Outpatient Medications   Medication Sig Dispense Refill    ascorbic acid, vitamin C, (VITAMIN C) 500 MG tablet Take 1 tablet (500 mg total) by mouth daily. 14 tablet 0    chlorthalidone (HYGROTON) 50 MG tablet Take 1 tablet (50 mg total) by mouth daily. 90 tablet 3 empty container Misc Use as directed to dispose of Humira pens. 1 each 2    gabapentin (NEURONTIN) 100 MG capsule Take 1 capsule (100 mg total) by mouth Three (3) times a day for 5 days. 15 capsule 0    HUMIRA PEN CITRATE FREE 40 MG/0.4 ML Inject the contents of 1 pen (40mg ) under the skin weekly as maintenance. 4 each 3    HUMIRA PEN CITRATE FREE STARTER PACK FOR CROHN'S/UC/HS 3 X 80 MG/0.8 ML Inject the contents of 2 pens (160mg ) under the skin on day 1, THEN inject 1 pen (80mg ) on day 15. 3 each 0    ibuprofen (MOTRIN) 800 MG tablet Take 1 tablet 3 or 4 times a day as needed for pain and inflammation. Take with food to prevent stomach problems. 60 tablet 0    metFORMIN (GLUCOPHAGE-XR) 500 MG 24 hr tablet Take 1 tablet (500 mg total) by mouth Two (2) times a day. 120 tablet 3    norethindrone (ORTHO MICRONOR) 0.35 mg tablet Take 1 tablet by mouth daily. 84 tablet 3     No current facility-administered medications for this visit.       Allergies:  No Known Allergies    Food Intolerances/Dietary Restrictions:  {Dietary Restrictions:1211321}    Diagnosis/Impression:  The patient is currently *** estimated nutrition needs related to ***.  Per anthropometric data, BMI: *** classifies patient as obese (class ***).  Current diet is *** in protein and ***non-starchy vegetables.  Beverages are ***.  Nutrition labs from *** did not show any micronutrient deficiencies.  Blood sugar control is ***.      Malnutrition Assessment using AND/ASPEN Clinical Characteristics:                             Nutrition Goals & Evaluation      Apply dietary recommendations to achieve goal weight of *** lb ({MNT Goal Progress:77446})  Sustainable weight loss of 5-10% (*** lb) in the next 6 months ({MNT Goal Progress:77446})  Meet nutritional needs  ({MNT Goal Progress:77446})  Reduce obesity-related long-term health risk  ({MNT Goal Progress:77446})  ***  ({MNT Goal Progress:77446})  ***  ({MNT Goal Progress:77446})    Nutrition goals reviewed, and relevant barriers identified and addressed: {MNT Barriers to Care:77905}.  She is evaluated to have {DESC; GOOD/FAIR/POOR:18685} willingness and ability to achieve nutrition goals.     Nutrition Assessment       She has {Overweight/Obesity:30106::Overweight} as evidenced by a There is no height or weight on file to calculate BMI.. Barriers to weight loss include {MNT Causes of Obesity:60411}.   Food history indicates usual diet is {MNT adequate/inadequate:78701} in protein, {MNT adequate/inadequate:78701}in non-starchy vegetables.   Usual dietary habits and food choices reveal {MNT DM2:59938}.   Recent laboratory results {do/do not aec:39110} indicate micronutrient deficiencies. She has {DESC; GOOD/FAIR/POOR:18685} blood sugar control.     Nutrition Intervention      {MNT Intervention:72370}  Nutrition Tracking/Logging Tool: She {MNT Nourishly Intervention Documentation:78584}      Estimated Needs:  Daily Estimated Nutrient Needs:  Energy:   kcals   using  ,  ]  Protein:   gm [  using  ,  ]  Carbohydrate:   [ ]   Fluid:   mL [ ]     Patient Education:  Nutrition counseling and intervention offered included:  ***    Materials Provided:  {Materials provided were:304200136}    Length of visit was *** minutes.      Nutrition Plan:   ***    Follow up will occur in ***.     {MNT Monitoring at Follow-up:76793} will be assessed at time of follow-up.         Recommendations for {ONS Recommendations:77904}:  ***       {MNT Time Associate Professor [employer] or Virtual):78573}

## 2022-05-16 NOTE — Unmapped (Signed)
Nutrition Instructions     Try to keep a consistent meal pattern and avoid skipping any meals    Spread meals out into 5-6 smaller meals     Meals should generally be no more than 4 hours apart.     Take your time to eat and avoid eating with distractions     Eat only to your natural satiety level. You should feel satisfied, not ???uncomfortable???     Focus on healthy food options like lean protein and non-starchy vegetables.   Follow the plate method for serving suggestions    Make sure to include a source of protein with all your meals. Consider plant based protein like tofu and lentils    Avoid intake of artificial/sugary/carbonated beverages. Hydrate with water daily (at least 64 ounces per day)    Try to stay active as able throughout your day. Aim for a minimum of 150 minutes of moderate/intense physical activity per week.     If you are starting an exercise routine for the first time, check with your physician to ensure that it is safe for you to do so. Start slowly to avoid possible injury or burnout.     Pre surgery Bariatric Goals     The Bariatric Nutrition Goals are eating habits patients need to practice and be successful   with prior to having bariatric surgery. These habits are what will lead to long term success   after bariatric surgery. Remember, the surgery is a tool to help you lose weight. You need to   put in the work by making the right food choices and getting regular exercise in order to be   successful long term.  You must show progression and success with the nutrition goals prior to surgery.     Bariatric Nutrition Goals  Follow a consistent meal pattern. Eat 3 small meals and 1-2snacks.  Do not skip meals and do not graze.  Do not go long periods of time (more than 4hr) without eating.  Have protein with every meal and most snacks. Aim for 60-80 grams of protein daily.  Drink at least 64oz of water daily.  Avoid all sugary drinks (Examples: juice, soda, sweet tea, lemonade)  Avoid carbonated drinks. (Examples: soda, seltzer, sparkling water)  Practice mindful eating behavior:  Eat slowly, take small half-sized bites and chew each little bite 20-30 times.  Stop eating when your stomach is satisfied and no longer hungry (before you feel   full).  Practice not drinking with meals, wait 30 minutes after the meal before drinking.  Start a chewable bariatric multivitamin (approved by dietitian).  Exercise - Aim for 30 minutes of activity outside of your normal routine most days of   the week. Start small if you are sedentary or have limited mobility and increase as you   can.  Keep a food journal.

## 2022-05-17 NOTE — Unmapped (Signed)
[facility] Refill Coordination Note    Specialty Medication(s) to be Shipped:   Inflammatory Disorders: Humira    Other medication(s) to be shipped: No additional medications requested for fill at this time     Wendy Chen, DOB: 1981/03/03  Phone: [phone] (home)       All above HIPAA information was verified with patient.     Was a Nurse, learning disability used for this call? No    Completed refill call assessment today to schedule patient's medication shipment from the [facility] [phone]).  All relevant notes have been reviewed.     Specialty medication(s) and dose(s) confirmed: Regimen is correct and unchanged.   Changes to medications: Edra reports no changes at this time.  Changes to insurance: No  New side effects reported not previously addressed with a pharmacist or physician: None reported  Questions for the pharmacist: No    Confirmed patient received a Conservation officer, historic buildings and a Surveyor, mining with first shipment. The patient will receive a drug information handout for each medication shipped and additional FDA Medication Guides as required.       DISEASE/MEDICATION-SPECIFIC INFORMATION        For patients on injectable medications: Patient currently has 1 doses left.  Next injection is scheduled for 05/19/2022.    SPECIALTY MEDICATION ADHERENCE     Medication Adherence    Patient reported X missed doses in the last month: 1  Specialty Medication: Humira  Patient is on additional specialty medications: No  Any gaps in refill history greater than 2 weeks in the last 3 months: no  Demonstrates understanding of importance of adherence: yes  Informant: patient  Reliability of informant: reliable  Confirmed plan for next specialty medication refill: delivery by pharmacy  Refills needed for supportive medications: not needed              Were doses missed due to medication being on hold? No      REFERRAL TO PHARMACIST     Referral to the pharmacist: Not needed      [facility]     Shipping address confirmed in Epic.     Delivery Scheduled: Yes, Expected medication delivery date: 05/18/2022.     Medication will be delivered via Same Day Courier to the prescription address in Epic WAM.    Ladarius Seubert D Clemma Johnsen   [facility] Specialty Technician

## 2022-05-18 MED FILL — HUMIRA PEN CITRATE FREE 40 MG/0.4 ML: 28 days supply | Qty: 4 | Fill #2

## 2022-06-05 ENCOUNTER — Ambulatory Visit
Admit: 2022-06-05 | Discharge: 2022-06-06 | Payer: PRIVATE HEALTH INSURANCE | Attending: Registered" | Primary: Registered"

## 2022-06-05 ENCOUNTER — Ambulatory Visit: Admit: 2022-06-05 | Discharge: 2022-06-06 | Payer: PRIVATE HEALTH INSURANCE | Attending: Family | Primary: Family

## 2022-06-05 LAB — COMPREHENSIVE METABOLIC PANEL
ALBUMIN: 4 g/dL (ref 3.4–5.0)
ALKALINE PHOSPHATASE: 65 U/L (ref 46–116)
ALT (SGPT): 18 U/L (ref 10–49)
ANION GAP: 9 mmol/L (ref 5–14)
AST (SGOT): 17 U/L (ref <=34)
BILIRUBIN TOTAL: 0.8 mg/dL (ref 0.3–1.2)
BLOOD UREA NITROGEN: 11 mg/dL (ref 9–23)
BUN / CREAT RATIO: 14
CALCIUM: 9.6 mg/dL (ref 8.7–10.4)
CHLORIDE: 102 mmol/L (ref 98–107)
CO2: 28 mmol/L (ref 20.0–31.0)
CREATININE: 0.78 mg/dL
EGFR CKD-EPI (2021) FEMALE: >90 mL/min/{1.73_m2} (ref >=60)
GLUCOSE RANDOM: 95 mg/dL (ref 70–179)
POTASSIUM: 3.4 mmol/L (ref 3.4–4.8)
PROTEIN TOTAL: 7.6 g/dL (ref 5.7–8.2)
SODIUM: 139 mmol/L (ref 135–145)

## 2022-06-05 LAB — TOXICOLOGY SCREEN, URINE
AMPHETAMINE SCREEN URINE: NEGATIVE
BARBITURATE SCREEN URINE: NEGATIVE
BENZODIAZEPINE SCREEN, URINE: NEGATIVE
BUPRENORPHINE, URINE SCREEN: NEGATIVE
CANNABINOID SCREEN URINE: NEGATIVE
COCAINE(METAB.)SCREEN, URINE: NEGATIVE
FENTANYL SCREEN, URINE: NEGATIVE
METHADONE SCREEN, URINE: NEGATIVE
OPIATE SCREEN URINE: NEGATIVE
OXYCODONE SCREEN URINE: NEGATIVE

## 2022-06-05 LAB — CBC
HEMATOCRIT: 41.4 % (ref 34.0–44.0)
HEMOGLOBIN: 13.8 g/dL (ref 11.3–14.9)
MEAN CORPUSCULAR HEMOGLOBIN CONC: 33.3 g/dL (ref 32.0–36.0)
MEAN CORPUSCULAR HEMOGLOBIN: 27.8 pg (ref 25.9–32.4)
MEAN CORPUSCULAR VOLUME: 83.6 fL (ref 77.6–95.7)
MEAN PLATELET VOLUME: 7.7 fL (ref 6.8–10.7)
PLATELET COUNT: 329 10*9/L (ref 150–450)
RED BLOOD CELL COUNT: 4.95 10*12/L (ref 3.95–5.13)
RED CELL DISTRIBUTION WIDTH: 14.3 % (ref 12.2–15.2)
WBC ADJUSTED: 6.7 10*9/L (ref 3.6–11.2)

## 2022-06-05 LAB — FOLATE: FOLATE: 21.4 ng/mL (ref >=5.4)

## 2022-06-05 LAB — LIPID PANEL
CHOLESTEROL/HDL RATIO SCREEN: 4 (ref 1.0–4.5)
CHOLESTEROL: 206 mg/dL — ABNORMAL HIGH (ref <=200)
HDL CHOLESTEROL: 52 mg/dL (ref 40–60)
LDL CHOLESTEROL CALCULATED: 118 mg/dL — ABNORMAL HIGH (ref 40–99)
NON-HDL CHOLESTEROL: 154 mg/dL — ABNORMAL HIGH (ref 70–130)
TRIGLYCERIDES: 179 mg/dL — ABNORMAL HIGH (ref 0–150)
VLDL CHOLESTEROL CAL: 35.8 mg/dL (ref 9–37)

## 2022-06-05 LAB — HEMOGLOBIN A1C
ESTIMATED AVERAGE GLUCOSE: 94 mg/dL
HEMOGLOBIN A1C: 4.9 % (ref 4.8–5.6)

## 2022-06-05 LAB — IRON & TIBC
IRON SATURATION (CALC): 22 %
IRON: 82 ug/dL
TOTAL IRON BINDING CAPACITY (CALC): 378 ug/dL (ref 250.0–425.0)
TRANSFERRIN: 300 mg/dL

## 2022-06-05 LAB — VITAMIN B12: VITAMIN B-12: 791 pg/mL (ref 211–911)

## 2022-06-05 LAB — TSH: THYROID STIMULATING HORMONE: 1.107 u[IU]/mL (ref 0.550–4.780)

## 2022-06-05 NOTE — Unmapped (Signed)
[facility]   Medical Nutrition Therapy Consultation       Visit Type: Initial Pre-Bariatric Nutrition Assessment    Referral Reason: Wendy Chen is a 41 y.o. female seen for medical nutrition therapy in preparation for bariatric surgery. Patient is interested in the sleeve gastrectomy. She has been considering weight loss surgery to help with quicker weight loss for hernia repair.     Her interim medical history is significant for HTN, Sleep apnea, and Obesity.    Social History:  Household members: Patient lives with her 3 sons (ages 40, 82, 5).  Who does the cooking and shopping: Patient does the cooking and the shopping.  Work: Patient works as a Engineer, production at [employer].    Anthropometrics   Wt Readings from Last 12 Encounters:   04/21/22 (!) 148.4 kg (327 lb 1.6 oz)   04/19/22 (!) 148.8 kg (328 lb 1.3 oz)   03/23/22 (!) 151.6 kg (334 lb 3.2 oz)   02/09/22 (!) 153.3 kg (338 lb)   01/11/22 (!) 154.9 kg (341 lb 8 oz)   11/29/21 (!) 153 kg (337 lb 3.2 oz)   08/22/21 (!) 152.9 kg (337 lb)   03/16/21 (!) 154.4 kg (340 lb 6.2 oz)   03/02/21 (!) 155.4 kg (342 lb 9.5 oz)   02/25/21 (!) 154.7 kg (341 lb)   12/01/20 (!) 150.1 kg (331 lb)   11/29/20 (!) 156 kg (344 lb)        Height:     Weight:    There is no height or weight on file to calculate BMI.  Ideal Body Weight:       GOAL weight per patient: BMI: 30    Nutrition Risk Screening:   I'm going to read you two statements that people have made about their food situation. For each statement, please tell me whether the statement was often true, sometimes true, or never true for your household in the last 12 months.     1. ???We worried whether our food would run out before we got money to buy more.???   never true    2. ???The food that we bought just didn't last, and we didn't have money to get more.  never true    Nutrition Focused Physical Exam:  Nutrition Evaluation  Overall Impressions: Nutrition-Focused Physical Exam not indicated due to lack of malnutrition risk factors. (06/05/22 1132)  Nutrition Designation: Obese class III  (BMI > 39.99 kg/m2) (06/05/22 1132)    Malnutrition Screening:   Patient does not meet AND/ASPEN criteria for malnutrition at this time (06/05/22 1132)    Biochemical Data, Medical Tests and Procedures:  {ONS Results Reviewed Nutrition Related:77903}    Lab Results   Component Value Date    HGB 12.5 02/09/2022    HCT 38.1 02/09/2022    MCV 83.5 02/09/2022    VITDTOTAL 19.9 (L) 02/09/2022     Lab Results   Component Value Date    ALKPHOS 66 02/09/2022    ALT 16 02/09/2022    AST 20 02/09/2022    BUN 20 12/15/2021    CREATININE 0.78 12/15/2021    GFRNONAA >=60 10/25/2017    GFRAA >=60 10/25/2017     Lab Results   Component Value Date    CHOL 217 (H) 01/02/2020    HDL 57 01/02/2020    LDL 135 (H) 01/02/2020    NONHDL 160 01/02/2020    TRIG 124 01/02/2020    A1C 4.9 02/09/2022  TSH 1.053 02/09/2022       Medications and Vitamin/Mineral Supplementation:   All nutritionally pertinent medications reviewed on 06/05/2022.   Nutritionally pertinent medications include: metformin, Humira    She is taking nutrition supplements. multivitamin    Current Outpatient Medications   Medication Sig Dispense Refill    ascorbic acid, vitamin C, (VITAMIN C) 500 MG tablet Take 1 tablet (500 mg total) by mouth daily. 14 tablet 0    chlorthalidone (HYGROTON) 50 MG tablet Take 1 tablet (50 mg total) by mouth daily. 90 tablet 3    empty container Misc Use as directed to dispose of Humira pens. 1 each 2    gabapentin (NEURONTIN) 100 MG capsule Take 1 capsule (100 mg total) by mouth Three (3) times a day for 5 days. 15 capsule 0    HUMIRA PEN CITRATE FREE 40 MG/0.4 ML Inject the contents of 1 pen (40mg ) under the skin weekly as maintenance. 4 each 3    HUMIRA PEN CITRATE FREE STARTER PACK FOR CROHN'S/UC/HS 3 X 80 MG/0.8 ML Inject the contents of 2 pens (160mg ) under the skin on day 1, THEN inject 1 pen (80mg ) on day 15. 3 each 0 ibuprofen (MOTRIN) 800 MG tablet Take 1 tablet 3 or 4 times a day as needed for pain and inflammation. Take with food to prevent stomach problems. 60 tablet 0    metFORMIN (GLUCOPHAGE-XR) 500 MG 24 hr tablet Take 1 tablet (500 mg total) by mouth Two (2) times a day. 120 tablet 3    norethindrone (ORTHO MICRONOR) 0.35 mg tablet Take 1 tablet by mouth daily. 84 tablet 3     No current facility-administered medications for this visit.       Nutrition History:     Dietary Restrictions: No known food allergies or food intolerances.     Gastrointestinal Issues: Denied issues    Hunger and Satiety:  Patient endorsed a good sense of hunger/satiety, but may go to the point of being physically too full.    Physical Activity:  Physical activity level is moderate with 3-5 days of exercise.   Type: every other day at [employer] (treadmill 20 minutes + 30 minute circuit)  Barriers to physical activity: no barriers    Diet Recall:   Up: 8:00am (usually 5am when school is in)  B: 8-11:00am: scrambled eggs + cheese + bacon/sausage + toast  - may skip 3x/wk  L: 1-2:00pm: sandwich (at home) or leftovers  D: 8-9:00pm: fried chicken/turkey + mac and cheese + green beans/corn or salad + chicken + vegetables + cheese + cranberry/nut topping     Food-Related History:  Snacks: nuts, sunflower seeds, pickles, pork rinds  Beverages: orange juice, water, apple juice, sweet tea, tries to avoid soda  Dining Out: not really    Eating Behaviors:  Excessive Hunger: Denies  Emotional Eating: She noted eating to deal with feelings other than hunger including boredom.  Grazing: Denied issues.   Cravings: dill pickles   Nighttime Eating:  Might have some nuts. Patient reports it doesn't happen that often.   Fast Eating: Consumes foods quickly.   Overeating: Denied issues with overeating.     Nutritional Needs:  Energy:   kcals [  using  ,  ]  Protein:   gm [  using  ,  ]  Carbohydrate:   [ ]   Fluid:   [ ]       Nutrition Goals & Evaluation loss) using last recorded weight, 147.9 kg (06/06/22 1450)]  Protein: 72-87 gm [1.0-1.2 gm/kg using adjusted body weight (for a BMI: 25), 72.4 kg (06/06/22 1450)]  Carbohydrate:   [< / equal to 45% of kcal]  Fluid: 2000-3000 [ ]       Nutrition Goals & Evaluation      Demonstrate understanding of bariatric nutrition  (New)  Demonstrate behavioral change  (New)  Meet nutritional needs (New)  Compliance with nutrition goals (New)  (0.5-1.0)#/wk weight loss prior to next RD visit (New)    Nutrition goals reviewed, and relevant barriers identified and addressed: none evident. She is evaluated to have good willingness and ability to achieve nutrition goals.     Nutrition Assessment       The patient is currently likely meeting estimated nutrition needs with current intake. Per anthropometric data, BMI: 51.06 classifies patient as obese (class 3). Nutrition related labs from 07/10 were reviewed and showed elevated total cholesterol, TG, and LDL, HbA1c: 4.9% shows good blood sugar control. Vitamin D is still in process. 24-hour recall/usual intake indicates patient usually eats 2-3 meals per day. Patient would benefit from a consistent meal/snack schedule with a focus on protein. Patient with a low reliance on convenience foods/foods prepared outside of the home. Beverages are sweetened with sugar and may be carbonated. Current level of physical activity may possibly be adequate to facilitate significant weight loss pending intensity level.    For eating disorder/behavior evaluation please see psych note.       Bariatric Surgery Assessment: RD is withholding assessment for insurance approval for weight loss surgery pending patient follow-up where success with plan goals and food record/usual intake/24-hour recall will be assessed.           Nutrition Intervention      - Nutrition Education: bariatric surgery diet/eating style. Education resources provided include: after visit summary with patient instructions   Nutrition Tracking/Logging Tool: She was provided registration information for the [employer] nutrition management mobile application     Nutrition Plan:   Please see patient instructions.    Follow up will occur in 4-6 weeks.       Food/Nutrition-related history, Anthropometric measurements, Biochemical data, medical tests, procedures, Patient understanding or compliance with intervention and recommendations , and Effectiveness of nutrition interventions will be assessed at time of follow-up.       Recommendations for Clinical Team:  Please encourage a consistent meal/snack schedule with a focus on protein. Exercise outside of daily routine should also be encouraged for at least 30 minutes 4x/wk.       Time spent 54 minutes

## 2022-06-05 NOTE — Unmapped (Addendum)
NEW Outpatient Bariatric Surgery Note        History   Chief Complaint: Obesity: BMI: 51.07, (Ht: 5' 7, Wt: 326#) with an interest in Bariatric Surgery. Insurance: [employer].  PCP: Lilian Kapur, MD     HPI) Obesity History:   Wendy Chen is a 41 y.o. with obesity and related co-morbidities who is seen today to further discuss surgical candidacy. Wendy Chen was evaluated by the [facility] Obesity Medicine Team on 02/09/2022 by Dr Loa Socks who reviewed her history of obesity, weight loss attempts, medical therapy tried, and medications or risk factors for weight gain. Wendy Chen was referred to the Bariatric Surgery Intake Team today based on her appropriate candidacy. Follow up visits: 04/19/2022, 05/15/2022.   She expresses interest in being considered for bariatric surgery as a means to improve health and overall quality of life. As such, a follow up bariatric surgery consultation has been scheduled for today.   Has been contemplating surgery for a few months.  Strong family hx of obesity: yes    Onset of obesity at age 19 when she weighed around 190 lbs.  Her motivation for bariatric surgery is based on : need for hernia repair, BMI < 35, weight 225 lbs or less.    Current use of AOM (anti-obesity medication): YES- metformin  She feels obesity has been affecting her life and feels the excess body weight interferes with ADL's and negatively impacts quality of living.    PAST MEDICAL HISTORY:  Past Medical History:   Diagnosis Date    AMA age 41 10/04/2016    We discussed the patient's age related risk of aneuploidy.  We discussed screening options including serum screening and NIPT as well as diagnostic options of CVS and amniocentesis.  We discussed risks, benefits and diagnostic capability of both CVS and amniocentesis.   -ordered genetic consult and first trimester screen  -Age 20 at [facility] -S/p genetic counseling 11/17 -Negative cfDNA -Fetal fraction 1.5% (Fetal fraction is affected by fetal aneuploidy (trisomy 13 and 18), maternal BMI, and other maternal conditions such as preexisting hypertension. In the setting of low fetal fraction (typically defined in the literature as <4%) sensitivity of cfDNA screening may be reduced Janyth Contes, 2015. Reprod Sci).      Heart murmur     benign, no abx prophylaxis required, 2012 echo noted trivial TR    Hidradenitis suppurativa 2018    Hypertension     Migraine     Obesity     Postpartum depression 2007, 2012    Procreative genetic counseling 10/05/2016    Genetic counseling visit on 10/09/16 Aneuploidy screening/ testing: cfDNA; results negative - low fetal fraction Carrier screening: [x]  Cystic fibrosis - neg x 106 mutationss [x]  Spinal muscular atrophy - 3 copies; reduced carrier risk [x]  Hemoglobinopathy screening - normal adult hemoglobin present      Sleep apnea     Snoring 12/08/2016     SHE HAS A HERNIA IN THE ABDOMINAL WALL    PAST SURGICAL HISTORY:  Gallbladder present  Any hx of Hernia repair: No    Past Surgical History:   Procedure Laterality Date    CESAREAN SECTION  2007, 2012    PR CESAREAN DELIVERY ONLY N/A 04/03/2017    Procedure: CESAREAN DELIVERY ONLY;  Surgeon: Barb Merino, MD;  Location: L&D C-SECTION OR SUITES [facility];  Service: Family Planning    PR REVISE MEDIAN N/CARPAL TUNNEL SURG Right 03/02/2021    Procedure: NEUROPLASTY AND/OR TRANSPOSITION; MEDIAN NERVE AT CARPAL  TUNNEL;  Surgeon: Theodora Blow Jacqlyn Krauss, MD;  Location: ASC OR [facility];  Service: Orthopedics    PR REVISE MEDIAN N/CARPAL TUNNEL SURG Left 03/16/2021    Procedure: NEUROPLASTY AND/OR TRANSPOSITION; MEDIAN NERVE AT CARPAL TUNNEL;  Surgeon: Daisy Lazar, MD;  Location: ASC OR [facility];  Service: Orthopedics    WISDOM TOOTH EXTRACTION         Preoperative Risk Factors:  Any history of anesthesia related complications, including intubation difficulty:No    Any history of bleeding or clotting disorder: No    Cardiac Hx:   Hx of HTN -dx in her 65's strong family hx.  Recent med dose adjustment in March, b/p have been better  Under care of cardiologist: No.   Exercise intolerance: No     Diabetes Hx: No  Individualized Metabolic Surgery Score: n/a  Surgery recommended: n/a  Resource: [email or web address]  Lab Results   Component Value Date    A1C 4.9 06/05/2022         MEDICATIONS:    Current Outpatient Medications:     ascorbic acid, vitamin C, (VITAMIN C) 500 MG tablet, Take 1 tablet (500 mg total) by mouth daily., Disp: 14 tablet, Rfl: 0    chlorthalidone (HYGROTON) 50 MG tablet, Take 1 tablet (50 mg total) by mouth daily., Disp: 90 tablet, Rfl: 3    empty container Misc, Use as directed to dispose of Humira pens., Disp: 1 each, Rfl: 2    gabapentin (NEURONTIN) 100 MG capsule, Take 1 capsule (100 mg total) by mouth Three (3) times a day for 5 days., Disp: 15 capsule, Rfl: 0    HUMIRA PEN CITRATE FREE 40 MG/0.4 ML, Inject the contents of 1 pen (40mg ) under the skin weekly as maintenance., Disp: 4 each, Rfl: 3    HUMIRA PEN CITRATE FREE STARTER PACK FOR CROHN'S/UC/HS 3 X 80 MG/0.8 ML, Inject the contents of 2 pens (160mg ) under the skin on day 1, THEN inject 1 pen (80mg ) on day 15., Disp: 3 each, Rfl: 0    metFORMIN (GLUCOPHAGE-XR) 500 MG 24 hr tablet, Take 1 tablet (500 mg total) by mouth Two (2) times a day., Disp: 120 tablet, Rfl: 3    norethindrone (ORTHO MICRONOR) 0.35 mg tablet, Take 1 tablet by mouth daily., Disp: 84 tablet, Rfl: 3    NSAIDs use: OTC ibuprofen prn    ALLERGIES:  Patient has no known allergies.    FAMILY HISTORY:  family history includes Alzheimer's disease in her paternal uncle; Cancer in her maternal uncle; Colon cancer in her paternal uncle; Diabetes in her father, maternal grandfather, maternal grandmother, and mother; Heart attack in her brother; Heart murmur in her maternal grandfather and maternal grandmother; Hypertension in her brother, father, mother, and sister; Seizures in her brother; Stroke in her brother.  Family hx of colon cancer: paternal uncle     Lynch Syndrome: no      REVIEW OF SYMPTOMS:  A ten-point ROS was performed and is negative except for the pertinent findings in the HPI. Some abdominal pain in the hernia- with cough only.    Denies palpitations, swelling of feet/legs, unwanted hair on face or chest, chest pain, fever, racing heart, shortness of breath, coughing, nausea, vomiting, headaches, change in bowel habits, bruising, bleeding, fainting or seizures    GI Symptoms:  She has an abdominal hernia- she had been having pain with cough or sneezing. She was dx on exam in march.   Reflux: No. PPI or H2B use: no.  Nausea: No. Vomiting: No    Denies a GI problems such as IBS, Crohn's, motility issues, dysphagia, strictures.  Denies a hx of Gout.  Denies a hx Pancreatitis  Denies a hx of NASH/Fatty liver, or Hepatitis.  Bowel habits are regular. No changes in consistency, pattern.   Testing:   EGD: never  Colonoscopy: never  Other GI testing: 12/07/2021: A moderate to large size Infrarenal ventral abdominal wall hernia with a defect measuring 6.4 cm in the transverse plane and 8.3 cm on the craniocaudal plane. The hernia contains part of the ascending colon, cecum, ileal loops, terminal ileum and appendix.    GU/GYN/Sexual health:  Kidney disease: no,   Lab Results   Component Value Date    CREATININE 0.78 06/05/2022     Renal calculi: no   Sexual Health:   Sexually active: no     Menses: irregular, Heavy flow: YES, w/clots .  Birth Control: yes, pill.   G3P3  Desire for future Pregnancies: no.  PCOS: no    Sleep Patterns:  Sleep apnea hx: YES .   Last polysomnogram: 2018   AHI: 20, REM 49.  (ECHO no)  CPAP at level of 15 cm H2O with EPR 3 and with heated humidifier for nasal dryness, mask: ResMed Quattro-Mirage full-face mask  Last visit with neurology or pulmonology: not recently 2020  PAP compliance: 100%  Tolerating Pap therapy: yes    Activity Habits:  Exercises outside of normal routine/work.  She goes to [employer].   Able to ambulate independently: YES    Limitations in physical mobility: No    ADL's: independent, no difficulty    Eating Habits:  Please see Registered Dietitian's intake eval for full details  Is eating 2-3 meals a day  Drinks: Juice- Orange, Apple. Sweet tea.     Vitamin intake: Gummy MVI  Any hx of anemia: yes  Pica: likes ice  Meal skipping: breakfast    Significant sweets craver: not much, prefers vinegary food    BEDS-7 score: 0/7    Psychological Overview:  Social History     Social History Narrative    She is from [place]    Single    She has 3 sons. (Ages 71, 31, 5).    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     She has a hx of depression.  Major stressors currently: Normal kid stuff.    Psychiatric medications: No, never.    Is not in psychotherapy.    Any history of psychiatric hospitalizations, suicidal thoughts, ideations, attempts: no.   Please see upcoming Psychologist's intake eval for full details.  Frequency of alcohol: never.   Nicotine Hx (smoke, vape, chew): never  Illicit drugs: no. Current use of CBD oil, gummies, edibles: no  Any past issues with addiction: No.   Knows anyone who has had weight loss surgery: No  Any concern about the ability to afford vitamins, follow up care, protein shakes: no    Health Maintenance  Verified cancer screening by primary care physician   Mammo: never. Pap: UTD.     RELEVANT TESTS/LAB RESULTS:  Lab Results   Component Value Date    WBC 6.7 06/05/2022    HGB 13.8 06/05/2022    HCT 41.4 06/05/2022    PLT 329 06/05/2022    CHOL 206 (H) 06/05/2022    TRIG 179 (H) 06/05/2022    HDL 52 06/05/2022    ALT 18 06/05/2022    AST  17 06/05/2022    NA 139 06/05/2022    K 3.4 06/05/2022    CL 102 06/05/2022    CREATININE 0.78 06/05/2022    BUN 11 06/05/2022    CO2 28.0 06/05/2022    TSH 1.107 06/05/2022    GLUF 87 02/22/2017     Lab Results   Component Value Date    VITAMINB12 791 06/05/2022   ,   Lab Results   Component Value Date    IRON 82 06/05/2022    TIBC 378.0 06/05/2022       Physical Exam   BP 143/87 (BP Position: Sitting)  - Pulse 62  - Ht 170.2 cm (5' 7)  - Wt (!) 147.9 kg (326 lb 1.6 oz)  - SpO2 97%  - BMI 51.07 kg/m??   Exam:  Alert, pleasant black female   Body Measurements in inches: Neck 16, Chest 57, Waist 48, Hips 56, LUA, 20 R thigh 29  Presents:  alone  Respirations:  Regular, easy, without SOB  Lungs: Clear to auscultation, no wheezing. No labored breathing.   Cardio:  RRR, S1S2, No murmur  Extremities:  MAE, without limitations. Presence of edema: No    Abdomen: obese soft. Nontender ventral hernia. Right lateral to the umbilicus 8 x 6 cm   Gait:  steady  Skin:  warm, dry, intact. No acanthosis. No skin tags.     Assessment     Encounter Diagnoses   Name Primary?    Essential hypertension Yes    Obstructive sleep apnea     BMI 50.0-59.9, adult (CMS-HCC)      Body surface area: 2.64 Meter squared  Adjusted weight: 211 lbs  Ideal Body weight : 135 lbs (not goal)  Pounds over ideal weight: 191  Anticipated weight loss: 95 lbs  Surgical Goal weight: 231 lbs (based on statistic of 30% total body weight loss)  Personal Goal: 225 lbs  Weight to lose before surgery: 10 lbs    Plan   Wendy Chen is a 41 y.o. year old who is interested in weight loss and meets standard bariatric surgery criteria: BMI 51.    1. She has a ventral hernia that is unable to be repaired until BMI < 35. (weight 225 lbs).   2. Elevated cholesterol: Current 10 yr ASCVD risk is 3.6% low (would not likely benefit from statin therapy). Lifestyle modification advised.  3.  HTN: B/P slightly elevated - admits to being nervous about visit. Continue current therapy.   See Cardiology for preop clearance. Get Echocardiogram (ordered)  4. OSA, treated with Cpap therapy nightly, compliant. Needs ECHO.  5. Hx of anemia- no current sign of anemia.  6. Hidradenitis suppurativa : stable on current treatment  We reviewed her medical and surgical history, previous weight loss attempts, ideal body weight/IBW, current eating habits, exercise habits, sleep habits.   I reviewed the medical weight loss therapy notes, she was considered a good candidate based on their intake/consult.   This provider agrees and feels Wendy Chen is a good candidate for bariatric surgery based on: failed attempts at medical weight loss or strong genetic predisposition.   She appears to have a high level of motivation and realistic expectations, we reviewed that weight loss surgery is a tool to assist in weight loss, with metabolic effects, controls portions and hunger, with candidate's compliance.  She will initiate the Bariatric Surgery process. We reviewed her candidacy, goals, program requirements, expectations, and typical outcomes.  At this time, she feels ready to move  forward with the bariatric surgery process, as well as continue to establish healthier eating, exercise, and sleep habits.     She will begin the pre-surgical dietary modification based on bariatric surgery RD (see today's note for details): focus on protein, avoid meal skipping, eliminate liquid calories, improve eating pattern, focus on well balanced meals, meal planning, reading food labels, avoidance of all sugary beverages/caloric drinks (supplement with water).  We also highly recommended mindfulness with eating and slowing down speed of eating.   She will need to attend a nutrition class in the near future and have a follow up with the RD afterwards to demonstrate understanding of the provided materials, education, vitamin requirements.  She will increase exercise outside of her daily routine, up to 150-300 minutes per week, as tolerated. This can include walking, chair exercises, cardio, weights, bands, and resistance training.   We reviewed realistic weight loss goal setting during the pre-operative phase, such as a 5-10% excess body weight loss before surgery, to assist with liver reduction prior to surgery.  I educated Koleen on realistic weight loss expectations post-surgery (30% total body weight loss, and/or a 50% excess body weight loss-calculated for her) and a likely improvement in co-morbidities.    She has good sleep habits and sleep apnea, Cpap Use: 100%, will need to bring Cpap to hospital.     Education   Introduction and Overview of Gastric Sleeve and Gastric Bypass.   Overview of anatomical changes  Benefits and results  Risks  Need for preoperative changes to initiate healthier eating and activity habits as well as improved outcomes  Surgical Risk Factors or provider concerns at time of this consult: needs Echo and Cardiac clearance.   Postoperative Information:   (length of stay, time off from work, physical activity, pain, constipation, nausea, stricture, stenosis, reflux, vitamin deficiency risk, dehydration, dumping syndrome, hair loss, skin laxity, changes in sexuality and hormone fluctuations, and need for long term follow up care. Use of protein supplements and need for vitamin supplementation).   Risk of gallstones reviewed, gallbladder intact    Fertility:  Is child bearing age: Pregnancy counseling given- formal birth control required x 18 months. She is not sexually active, but if becomes sexually active will use formal BC.  Reviewed cost associated with Bariatric Surgery- necessary lifelong vitamins (out of pocket), supplements, co-pays, follow up care.     Patient Preference:  Vertical Sleeve Gastrectomy     Preoperative requirements/screening:     1.  Labs. Nutrient screening.  Nicotine screen. Urine tox screen. Done: 06/05/2022    2.  GI Evaluation: EGD screen H-pylori. Advised to call and schedule   3.  Nutrition Class   4.  Psychosocial-behavioral evaluation TBS after nutrition class   5.  Financial questions refer to [facility] financial navigator  6.  Cardiopulmonary evaluation: ordered   7.  Join [employer] Support  Group. Attend Zoom based online support group   8.  Avoidance of alcohol, blood thinners, NSAIDS.  9.  Has been given the [facility] bariatric surgery binder, A Guide to Surgical Weight Loss.  Advised to read weekly until surgery   10. Continue nightly Cpap use   11. Is 2 years past due for Mammo- see PCP and schedule    She understands the need to see her PCP for routine health care needs outside of Obesity/Bariatric/Metabolic Surgery.      Return to Clinic:      4-6 weeks with RD  Follow up with  NP after steps have been completed for final approval    Wendy Chen L. Malahki Gasaway MSN, FNP APRN-BC  [employer]- Dept of Bariatric Surgery  06/05/2022    Attestation signed by  Encarnacion Chu MD at 06/05/2022    Case reviewed. Agree with care. I was available.     Length of visit : 73 minutes

## 2022-06-05 NOTE — Unmapped (Addendum)
[place] Department of Surgery  Division of Gastrointestinal Surgery  Bariatric Surgery      For Approval I need you to have:  Endoscopy  [phone]  Echo [phone]  Cardiac Clearance    Blood work: due, it will expire 6 months from date of blood drawn.   Wear CPAP     Mammo: is due at age 41.     Before our next visit, please:   Read all of the information provided in your white binder    Start making the dietary changes the Dietitian discussed with you today. Keep a food diary. No sugary drinks (avoid juices, sodas, ginger ale, sweet tea)     Your dietitian will assist with scheduling the mandatory Group Nutrition Class.   Any questions about RD visits should go directly to her (including if you have been cleared).      After your nutrition class, we will call you to schedule the Psychiatric evaluation  If you are under the care of a psychiatric provider, we will need a Mental Health Letter.     I will see you after you complete your formal work-up to clear you for surgery    Join the Facebook Group: [facility] Support Group.     Attend two Zoom based Support Groups:  Zoom Support Group Meetings:  Go to www.zoom.com    Meeting ID: [phone]     Password: [email or web address]  You must allow audio and video access.  Meetings are the 1st Monday of the month at 6 pm, and the 3rd Thursday of the month at 7 pm, updates will be on [employer] page    Thank you,    Delice Bison L. Muhsin Doris NP    To discuss payment/insurance/concerns call one of these Financial Navigators:  Deloria Lair [phone]  J'kai Williams [phone]  Jenean Lindau [phone] [facility])    Provide the CPT code for your surgery- (they will ask)  Gastric Bypass 10272  Gastric Sleeve [phone]

## 2022-06-05 NOTE — Unmapped (Addendum)
GOALS:    1.  Meal schedule: remember to eat protein every 3-4 hours    - include protein at every meal and every snack   9:00am: breakfast - include protein   1:00pm: lunch - protein + non-starchy vegetables   5:00pm: protein based snack   8:00pm: dinner - protein + non-starchy vegetables    PROTEIN OPTIONS: chicken/beef/pork, fish/tuna, eggs, cheese, cottage cheese, [place] yogurt, deli meat, protein shakes, beans, nuts/seeds, peanut butter   * Nuts and peanut butter are restricted to one serving once per day.    1/4 cup nuts OR 2 Tbsp peanut butter    2.  Eat protein foods first.  Vegetables second.  Carbohydrates/fruits last.     3.  Practice not drinking at meals and waiting for 30 minutes after meals to start drinking again    4.  Practice mindful eating.  Eat until you are satisfied.  Do not go to the point of being full.    5.  Eliminate carbonated/sugar sweetened beverages!   - NO juice, soda, diet soda, sweet tea, lemonade, Kool-Aid    6.  Research vitamins and try protein shakes.   - Protein shakes can be made at home using a whey protein powder + water or low fat milk or an unsweetened milk alternative    - do not add frozen fruit, bananas or peanut butter to homemade protein shakes   - Premade protein shakes should have at least 20g of protein and carbs/sugars should be in the single digits    - avoid products labeled keto    - options include: Premier Protein, Ensure/Boost Max Protein    - be sure to check the nutrition label on any product you choose    7.  EXERCISE: continue with current exercise   - increase duration/frequency/intensity as tolerated   - GOAL: 150-300 minutes per week    8.  Your next follow-up visit will determine if you are ready for the nutrition class.      Bariatric Pre-Surgery     [facility] is excited to support your journey toward bariatric surgery through the Nourishly mobile app. With this app, you will be able to access your meal plan. You will be able to track your progress and setbacks and connect directly with your treatment team. The app also allows you to practice important stress management, meal planning and self-reflection skills on a daily basis. Your clinicians will review your entries when you meet with them for appointments. The Nourishly app is free of charge for [facility] patients.     To get started, please follow these steps:     1. Download Nourishly from Midwife or Android phone  2. Select '[place] Program' on the Welcome to Nourishly screen of the app and then select 'Bariatric Program'  3. Register using your preferred email address   4. Link with your provider(s) using the provided 5-digit link code from the 'Connect' option on the app's home screen. Once you are registered and connected with your clinician, your app will automatically include your  meal plan, behavior change goals, and self-monitoring questions.   Wendy Chen's link code: [id number]    Any questions? Please contact your provider at [facility] or support@nourishly [email or web address] for technical support.

## 2022-06-07 LAB — VITAMIN D 25 HYDROXY: VITAMIN D, TOTAL (25OH): 23.9 ng/mL (ref 20.0–80.0)

## 2022-06-07 NOTE — Unmapped (Signed)
Addended by: Lilian Kapur on: 06/07/2022 11:06 AM     Modules accepted: Orders

## 2022-06-08 LAB — NICOTINE/COTININE, SERUM
COTININE: <3 ng/mL
NICOTINE: <3 ng/mL

## 2022-06-09 NOTE — Unmapped (Signed)
[facility] Refill Coordination Note    Specialty Medication(s) to be Shipped:   Inflammatory Disorders: Humira    Other medication(s) to be shipped:  norethindrone     Wendy Chen, DOB: [birth year]  Phone: [phone] (home)       All above HIPAA information was verified with patient.     Was a Nurse, learning disability used for this call? No    Completed refill call assessment today to schedule patient's medication shipment from the [facility] [phone]).  All relevant notes have been reviewed.     Specialty medication(s) and dose(s) confirmed: Regimen is correct and unchanged.   Changes to medications: [name] reports no changes at this time.  Changes to insurance: No  New side effects reported not previously addressed with a pharmacist or physician: None reported  Questions for the pharmacist: No    Confirmed patient received a Conservation officer, historic buildings and a Surveyor, mining with first shipment. The patient will receive a drug information handout for each medication shipped and additional FDA Medication Guides as required.       DISEASE/MEDICATION-SPECIFIC INFORMATION        For patients on injectable medications: Patient currently has 1 doses left.  Next injection is scheduled for 06/09/22.    SPECIALTY MEDICATION ADHERENCE     Medication Adherence    Patient reported X missed doses in the last month: 0  Specialty Medication: HUMIRA  Patient is on additional specialty medications: No              Were doses missed due to medication being on hold? No    Humira 40/0.4 mg/ml: 1 days of medicine on hand        REFERRAL TO PHARMACIST     Referral to the pharmacist: Not needed      [facility]     Shipping address confirmed in Epic.     Delivery Scheduled: Yes, Expected medication delivery date: 06/14/22.     Medication will be delivered via UPS to the prescription address in Epic WAM.    Unk Lightning   [facility] Specialty Technician

## 2022-06-12 ENCOUNTER — Ambulatory Visit: Admit: 2022-06-12 | Discharge: 2022-06-13 | Payer: PRIVATE HEALTH INSURANCE

## 2022-06-13 MED FILL — HUMIRA PEN CITRATE FREE 40 MG/0.4 ML: 28 days supply | Qty: 4 | Fill #3

## 2022-06-13 MED FILL — NORETHINDRONE (CONTRACEPTIVE) 0.35 MG TABLET: ORAL | 84 days supply | Qty: 84 | Fill #1

## 2022-06-14 DIAGNOSIS — L732 Hidradenitis suppurativa: Principal | ICD-10-CM

## 2022-06-14 MED ORDER — HUMIRA PEN CITRATE FREE 40 MG/0.4 ML
3 refills | 0 days
Start: 2022-06-14 — End: ?

## 2022-06-15 MED ORDER — HUMIRA PEN CITRATE FREE 40 MG/0.4 ML
3 refills | 0 days
Start: 2022-06-15 — End: ?

## 2022-06-15 NOTE — Unmapped (Signed)
Refill pended for Humira.  Last office visit 12/05/21

## 2022-06-16 MED ORDER — HUMIRA PEN CITRATE FREE 40 MG/0.4 ML
SUBCUTANEOUS | 3 refills | 84 days
Start: 2022-06-16 — End: 2023-06-16

## 2022-06-20 ENCOUNTER — Ambulatory Visit
Admit: 2022-06-20 | Discharge: 2022-06-21 | Payer: PRIVATE HEALTH INSURANCE | Attending: Family Medicine | Primary: Family Medicine

## 2022-06-20 DIAGNOSIS — I10 Essential (primary) hypertension: Principal | ICD-10-CM

## 2022-06-20 MED ORDER — TOPIRAMATE 25 MG TABLET
ORAL_TABLET | Freq: Every evening | ORAL | 1 refills | 60 days | Status: CP
Start: 2022-06-20 — End: ?
  Filled 2022-06-20: qty 60, 60d supply, fill #0

## 2022-06-20 NOTE — Unmapped (Signed)
Called patient to discuss recommendation to obtain annual Tb test prior to refill of Humira. Patient expressed she is driving and is will respond to MyChart message. MyChart message previously sent, will resend a new MyChart message.

## 2022-06-20 NOTE — Unmapped (Signed)
RD called patient to set up a nutrition appointment. RD was not able to reach patient. RD left a vm with a call back number. RD also sent patient a My Chart message also to try to set something up.     Leonette Most, RD LDN

## 2022-06-20 NOTE — Unmapped (Signed)
BP at goal today. Reviewed relevant medications and labs. Pt is following our Weight Management Clinic. See Obesity tab for medication changes.

## 2022-06-20 NOTE — Unmapped (Addendum)
Wendy Chen is a 41 y.o. 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 Comorbidities include hypertension, incisional hernia, hyperlipidemia, OSA (not using CPAP). 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    Pt needs to lose 120 lbs or BMI <35 to decrease risk of surgery--hernia repair. Spoke to Dr. Orvan Falconer that this degree of wt loss is probably not possible with medical management especially with her insurance not covering the GLP1s. She will discuss with Dr. Augustine Radar about bariatric surgery while we work with pts on healthy lifestyle habits and trial of medications that are covered by her insurance. (Updated 02/09/22)    Weight Summary:  Starting weight/BMI/WC/[place]: 338 lbs, BMI 52.93, WC 52, [place] 16.5 (02/09/2022)  Target weight/goal: No goal weight. Patient wants general improvement in health and appearance.  Today's weight/BMI: (!) 146.1 kg (322 lb 0.6 oz), BMI (!) 50.43 (06/20/2022)  % Body weight loss: 4.7%  Today's Waist Circumference: 48 inches (06/20/2022)  Today's visit number: 3rd  Duration in program: 4 mos    Today is visit 1/3 consecutive needed. Scheduled for nutritionist visit in August, then with [place] in September.     Referred by: [name], MD (Surgery)    GOALS: 1) Swap white rice to brown rice,  2) Eliminate orange juice  3) Continue great physical activity routine    I have reviewed the patient's medical history, lifestyle history and labs/tests.   My recommendations include the following:    Lifestyle Pattern Summary: Pt has started going to [employer] to do treadmill and 30 minute circuit every other day! Has also reduced fast food and SSBs, though still having 1 glass of orange juice daily. Congratulated patient on healthy changes and encouraged to continue.  - See GOALS above.     Sleep: Currently sleeping 6 hours without sleep aids.    -- Recommend 7-8 hours of sleep at night.    Stress management: Current stressors: single mom of 3 kids  -- Recommend to try relaxation techniques such as breathing techniques, listening to music to help sleep at night and yoga stretching throughout the day to help control stress.     Weight gain causing medication: Gabapentin    Medication: Taking Metformin 500 mg BID. Tolerating well. Denies s/e. Couldn't tolerate 1000 mg BID. Continue current dose. Medicaid does not cover GLP-1's for patients without diabetes. Discussed and decided to add topiramate. Instruction given. Side effect profile discussed. Prescription sent. Pt verbalized understanding and agreement with plan.    START topiramate 25 mg once daily at bedtime, CONTINUE Metformin 500 mg BID  - topiramate: started 06/20/22 at 322 lbs  - Metformin: restarted 02/09/2022 at 338 lbs.  Contraindicated:  - Phentermine: d/t HTN  - Wellbutrin: d/t HTN    Obesity Surgery: Pt has established with bari surg team and has been undergoing pre-op procedure. 3 consecutive visits required for insurance. Today is 1/3, scheduled with nutritionist next month, then seeing [place] in September. (Updated 06/20/22)    Medical conditions:  Glaucoma: no  Seizures: no  Medullary thyroid cancer (personal or family hx): no  Multiple Endocrine Neoplasia: no  Palpitations/Tachycardia: no  Chest Pain: no past history of MI.   Headaches/Migraines: YES  Nephrolithiasis: no  H/o pancreatitis: no  GERD: no     Prior Surgeries:  Cholecystectomy: no  Hysterectomy: no    Birth Control Methods: OCP

## 2022-06-20 NOTE — Unmapped (Signed)
[facility] Follow Up    Assessment/Plan:     Chief Complaint   Patient presents with   ??? Follow-up     Wt mngmt       Problem List Items Addressed This Visit        Unprioritized    Essential hypertension - Primary (Chronic)     BP at goal today. Reviewed relevant medications and labs. Pt is following our Weight Management Clinic. See Obesity tab for medication changes.          Class 3 obesity (CMS-HCC)     Wendy Chen is a 41 y.o. 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 Comorbidities include hypertension, incisional hernia, hyperlipidemia, OSA (not using CPAP). 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    Pt needs to lose 120 lbs or BMI <35 to decrease risk of surgery--hernia repair. Spoke to Dr. Orvan Falconer that this degree of wt loss is probably not possible with medical management especially with her insurance not covering the GLP1s. She will discuss with Dr. Augustine Radar about bariatric surgery while we work with pts on healthy lifestyle habits and trial of medications that are covered by her insurance. (Updated 02/09/22)    Weight Summary:  1. Starting weight/BMI/WC/[place]: 338 lbs, BMI 52.93, WC 52, [place] 16.5 (02/09/2022)  2. Target weight/goal: No goal weight. Patient wants general improvement in health and appearance.  4. Today's weight/BMI: (!) 146.1 kg (322 lb 0.6 oz), BMI (!) 50.43 (06/20/2022)  3. % Body weight loss: 4.7%  2. Today's Waist Circumference: 48 inches (06/20/2022)  4. Today's visit number: 3rd  5. Duration in program: 4 mos    Today is visit 1/3 consecutive needed. Scheduled for nutritionist visit in August, then with [place] in September.     Referred by: [name], MD (Surgery)    GOALS: 1) Swap white rice to brown rice,  2) Eliminate orange juice  3) Continue great physical activity routine    I have reviewed the patient's medical history, lifestyle history and labs/tests.   My recommendations include the following:    Lifestyle Pattern Summary: Pt has started going to [employer] to do treadmill and 30 minute circuit every other day! Has also reduced fast food and SSBs, though still having 1 glass of orange juice daily. Congratulated patient on healthy changes and encouraged to continue.  - See GOALS above.     Sleep: Currently sleeping 6 hours without sleep aids.    -- Recommend 7-8 hours of sleep at night.    Stress management: Current stressors: single mom of 3 kids  -- Recommend to try relaxation techniques such as breathing techniques, listening to music to help sleep at night and yoga stretching throughout the day to help control stress.     Weight gain causing medication: Gabapentin    Medication: Taking Metformin 500 mg BID. Tolerating well. Denies s/e. Couldn't tolerate 1000 mg BID. Continue current dose. Medicaid does not cover GLP-1's for patients without diabetes. Discussed and decided to add topiramate. Instruction given. Side effect profile discussed. Prescription sent. Pt verbalized understanding and agreement with plan.    START topiramate 25 mg once daily at bedtime, CONTINUE Metformin 500 mg BID  - topiramate: started 06/20/22 at 322 lbs  - Metformin: restarted 02/09/2022 at 338  lbs.  Contraindicated:  - Phentermine: d/t HTN  - Wellbutrin: d/t HTN    Obesity Surgery: Pt has established with bari surg team and has been undergoing pre-op procedure. 3 consecutive visits required for insurance. Today is 1/3, scheduled with nutritionist next month, then seeing [place] in September. (Updated 06/20/22)    Medical conditions:  Glaucoma: no  Seizures: no  Medullary thyroid cancer (personal or family hx): no  Multiple Endocrine Neoplasia: no  Palpitations/Tachycardia: no  Chest Pain: no past history of MI. Headaches/Migraines: YES  Nephrolithiasis: no  H/o pancreatitis: no  GERD: no     Prior Surgeries:  Cholecystectomy: no  Hysterectomy: no    Birth Control Methods: OCP               Return in about 2 months (around 08/21/2022) for Weight clinic F/U one slot. pt has to have appt with Ms. Lennie Hummer in August to meet criteria for surgery.    I have reviewed and addressed the patient???s adherence and response to prescribed medications. I have identified patient barriers to following the proposed medication and treatment plan, and have noted opportunities to optimize healthy behaviors. I have answered the patient???s questions to satisfaction and the patient voices understanding.    Time: Greater than 50% of this encounter was spent in direct consultation with the patient in evaluation and discussing all of the above. Duration of encounter: 30 minutes.     HPI:     Wendy Chen is a 41 y.o. female who  has a past medical history of AMA age 45 (10/04/2016), Heart murmur, Hidradenitis suppurativa (2018), Hypertension, Migraine, Obesity, Postpartum depression (2007, 2012), Procreative genetic counseling (10/05/2016), Sleep apnea, and Snoring (12/08/2016). who presents today for [facility] follow up.    Weight Management History  Wt Readings from Last 6 Encounters:   06/20/22 (!) 146.1 kg (322 lb 0.6 oz)   06/05/22 (!) 147.9 kg (326 lb 1.6 oz)   06/06/22 (!) 147.9 kg (326 lb 1 oz)   04/21/22 (!) 148.4 kg (327 lb 1.6 oz)   04/19/22 (!) 148.8 kg (328 lb 1.3 oz)   03/23/22 (!) 151.6 kg (334 lb 3.2 oz)         02/09/2022     1:00 PM 04/19/2022     9:45 AM 06/20/2022    10:08 AM   Waist Circumference   Waist Circumference 52 inches 49.5 inches 48 inches       Update: 6 lb loss in 2 mos. Pt is established with bari surg. 3 consecutive visits required for insurance.   Medication: Tolerating Metformin 500 mg twice daily well. Denies side effects. Could tolerate metformin 1000 bid.  Eating pattern: Has been trying to cut out fast food. If going, gets salad or grilled option. Pt still struggles with orange juice.   B: overnight oats or sausage with rice and scrambled eggs  S: none or popcorn  L: sandwich or stir fried veggies w/chicken and baked beans  S: dill pickle  D: meat loaf, green beans, corn (cooked at moms house)  S: popcorn  Physical Activity: Pt has started going to [employer] and doing treadmill and 30 minute circuit every other day.  Barrier: nothing specific    Lab Results   Component Value Date    LDL 118 (H) 06/05/2022    HDL 52 06/05/2022    A1C 4.9 06/05/2022    GLU 95 06/05/2022    TSH 1.107 06/05/2022    VITDTOTAL 23.9  06/05/2022    AST 17 06/05/2022    ALT 18 06/05/2022     Past Medical/Surgical History:     Past Medical History:   Diagnosis Date   ??? AMA age 61 10/04/2016    We discussed the patient's age related risk of aneuploidy. ??We discussed screening options including serum screening and NIPT as well as diagnostic options of CVS and amniocentesis. ??We discussed risks, benefits and diagnostic capability of both CVS and amniocentesis.   -ordered genetic consult and first trimester screen  -Age 55 at [facility] -S/p genetic counseling 11/17 -Negative cfDNA -Fetal fraction 1.5% (Fetal fraction is affected by fetal aneuploidy (trisomy 13 and 18), maternal BMI, and other maternal conditions such as preexisting hypertension. In the setting of low fetal fraction (typically defined in the literature as <4%) sensitivity of cfDNA screening may be reduced Janyth Contes, 2015. Reprod Sci).     ??? Heart murmur     benign, no abx prophylaxis required, 2012 echo noted trivial TR   ??? Hidradenitis suppurativa 2018   ??? Hypertension    ??? Migraine    ??? Obesity    ??? Postpartum depression 2007, 2012   ??? Procreative genetic counseling 10/05/2016    Genetic counseling visit on 10/09/16 Aneuploidy screening/ testing: cfDNA; results negative - low fetal fraction Carrier screening: [x]  Cystic fibrosis - neg x 106 mutationss [x]  Spinal muscular atrophy - 3 copies; reduced carrier risk [x]  Hemoglobinopathy screening - normal adult hemoglobin present     ??? Sleep apnea    ??? Snoring 12/08/2016     Past Surgical History:   Procedure Laterality Date   ??? CESAREAN SECTION  2007, 2012   ??? PR CESAREAN DELIVERY ONLY N/A 04/03/2017    Procedure: CESAREAN DELIVERY ONLY;  Surgeon: Barb Merino, MD;  Location: L&D C-SECTION OR SUITES [facility];  Service: Family Planning   ??? PR REVISE MEDIAN N/CARPAL TUNNEL SURG Right 03/02/2021    Procedure: NEUROPLASTY AND/OR TRANSPOSITION; MEDIAN NERVE AT CARPAL TUNNEL;  Surgeon: Daisy Lazar, MD;  Location: ASC OR [facility];  Service: Orthopedics   ??? PR REVISE MEDIAN N/CARPAL TUNNEL SURG Left 03/16/2021    Procedure: NEUROPLASTY AND/OR TRANSPOSITION; MEDIAN NERVE AT CARPAL TUNNEL;  Surgeon: Theodora Blow Jacqlyn Krauss, MD;  Location: [facility] OR [facility];  Service: Orthopedics   ??? WISDOM TOOTH EXTRACTION         Social History:     Social History     Socioeconomic History   ??? Marital status: Single     Spouse name: None   ??? Number of children: 3   ??? Years of education: None   ??? Highest education level: None   Tobacco Use   ??? Smoking status: Never     Passive exposure: Never   ??? Smokeless tobacco: Never   Vaping Use   ??? Vaping Use: Never used   Substance and Sexual Activity   ??? Alcohol use: No     Alcohol/week: 0.0 standard drinks   ??? Drug use: No   ??? Sexual activity: Not Currently     Birth control/protection: OCP   Other Topics Concern   ??? Do you use sunscreen? No   ??? Tanning bed use? No   ??? Are you easily burned? No   ??? Excessive sun exposure? No   ??? Blistering sunburns? No   Social History Narrative    She is from [place]    Single    She has 3 sons. (Ages 29, 6, 5).  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     Social Determinants of Health     Financial Resource Strain: Low Risk    ??? Difficulty of Paying Living Expenses: Not very hard   Food Insecurity: Unknown   ??? Worried About Running Out of Food in the Last Year: Patient refused   ??? Ran Out of Food in the Last Year: Patient refused   Transportation Needs: No Transportation Needs   ??? Lack of Transportation (Medical): No   ??? Lack of Transportation (Non-Medical): No       Family History:     Family History   Problem Relation Age of Onset   ??? Diabetes Mother    ??? Hypertension Mother    ??? Hypertension Father    ??? Diabetes Father    ??? Hypertension Sister    ??? Stroke Brother    ??? Seizures Brother    ??? Heart attack Brother    ??? Hypertension Brother    ??? Cancer Maternal Uncle         bone, colon   ??? Alzheimer's disease Paternal Uncle    ??? Colon cancer Paternal Uncle    ??? Heart murmur Maternal Grandmother    ??? Diabetes Maternal Grandmother    ??? Diabetes Maternal Grandfather    ??? Heart murmur Maternal Grandfather    ??? Anesthesia problems Neg Hx    ??? Bleeding Disorder Neg Hx    ??? Melanoma Neg Hx    ??? Basal cell carcinoma Neg Hx    ??? Squamous cell carcinoma Neg Hx        Allergies:     Patient has no known allergies.    Current Medications:     Current Outpatient Medications   Medication Sig Dispense Refill   ??? ascorbic acid, vitamin C, (VITAMIN C) 500 MG tablet Take 1 tablet (500 mg total) by mouth daily. 14 tablet 0   ??? chlorthalidone (HYGROTON) 50 MG tablet Take 1 tablet (50 mg total) by mouth daily. 90 tablet 3   ??? empty container Misc Use as directed to dispose of Humira pens. 1 each 2   ??? HUMIRA PEN CITRATE FREE 40 MG/0.4 ML Inject the contents of 1 pen (40mg ) under the skin weekly as maintenance. 4 each 3   ??? metFORMIN (GLUCOPHAGE-XR) 500 MG 24 hr tablet Take 1 tablet (500 mg total) by mouth Two (2) times a day. 120 tablet 3   ??? norethindrone (ORTHO MICRONOR) 0.35 mg tablet Take 1 tablet by mouth daily. 84 tablet 3   ??? gabapentin (NEURONTIN) 100 MG capsule Take 1 capsule (100 mg total) by mouth Three (3) times a day for 5 days. 15 capsule 0   ??? topiramate (TOPAMAX) 25 MG tablet Take 1 tablet (25 mg total) by mouth nightly. 60 tablet 1     No current facility-administered medications for this visit.       I have reviewed and (if needed) updated the patient's problem list, medications, allergies, past medical and surgical history, social and family history.    ROS:     Unless otherwise stated in the HPI:  CONSTITUTIONAL: no fever chills.   HEENT: Eyes: No diplopia or blurred vision. ENT: No earache, sore throat or runny nose.   CARDIOVASCULAR: No pressure, squeezing, strangling, tightness, heaviness or aching about the chest, neck, axilla or epigastrium.   RESPIRATORY: No cough, shortness of breath, PND or orthopnea.   GASTROINTESTINAL: No nausea, vomiting or diarrhea.  GENITOURINARY: No dysuria, frequency or urgency.   MUSCULOSKELETAL: no new pains, no joint swelling or redness.   SKIN: No change in skin, hair or nails.   NEUROLOGIC: No paresthesias, fasciculations, seizures or weakness.   PSYCHIATRIC: No disorder of thought or mood.   ENDOCRINE: No heat or cold intolerance, polyuria or polydipsia.   HEMATOLOGICAL: No easy bruising or bleeding.    Vital Signs:     Body mass index is 50.42 kg/m??. Waist Circumference: 48 inches    Wt Readings from Last 3 Encounters:   06/20/22 (!) 146.1 kg (322 lb 0.6 oz)   06/05/22 (!) 147.9 kg (326 lb 1.6 oz)   06/06/22 (!) 147.9 kg (326 lb 1 oz)     Temp Readings from Last 3 Encounters:   06/20/22 35.8 ??C (96.5 ??F)   04/21/22 36.2 ??C (97.1 ??F)   04/19/22 36.2 ??C (97.1 ??F)     BP Readings from Last 3 Encounters:   06/20/22 123/74   06/05/22 143/87   04/21/22 125/81     Pulse Readings from Last 3 Encounters:   06/20/22 73   06/05/22 62   04/21/22 87         02/09/2022     1:00 PM 04/19/2022     9:45 AM 06/20/2022    10:08 AM   Waist Circumference   Waist Circumference 52 inches 49.5 inches 48 inches          Physical Exam:     General: well appearing, in NAD, Body mass index is 50.42 kg/m??. Ambulatory without help.  Body fat distribution: General adiposity. No supraclavicular adiposity. No dorsal adiposity. Waist Circumference: 48 inches     Head: normocephalic atraumatic.  Eyes: PERRLA, EOMI, Sclera WNL.   Oral pharynx: moist, no exudate, no erythema, not enlarged. Good dental hygiene. Moderate OP crowding.  Neck: supple, no LAD, no thyromegaly, no bruit.  CV: RRR no rubs or murmurs. Peripheral edema: none.  Lungs: clear bilaterally to auscultation. No wheezing.  Abdomen: soft, NT/ND. No intertrigo. Moderate pannus. No hepatomegaly.   Extremities: no clubbing, cyanosis, No edema.  Skin: no concerning lesions, rashes observed. No lipomas, mild acanthosis nigricans.  Neuro: Alert and oriented X 3.    Labs:     Office Visit on 06/05/2022   Component Date Value Ref Range Status   ??? WBC 06/05/2022 6.7  3.6 - 11.2 10*9/L Final   ??? RBC 06/05/2022 4.95  3.95 - 5.13 10*12/L Final   ??? HGB 06/05/2022 13.8  11.3 - 14.9 g/dL Final   ??? HCT 82/95/6213 41.4  34.0 - 44.0 % Final   ??? MCV 06/05/2022 83.6  77.6 - 95.7 fL Final   ??? MCH 06/05/2022 27.8  25.9 - 32.4 pg Final   ??? MCHC 06/05/2022 33.3  32.0 - 36.0 g/dL Final   ??? RDW 08/65/7846 14.3  12.2 - 15.2 % Final   ??? MPV 06/05/2022 7.7  6.8 - 10.7 fL Final   ??? Platelet 06/05/2022 329  150 - 450 10*9/L Final   ??? Sodium 06/05/2022 139  135 - 145 mmol/L Final   ??? Potassium 06/05/2022 3.4  3.4 - 4.8 mmol/L Final   ??? Chloride 06/05/2022 102  98 - 107 mmol/L Final   ??? CO2 06/05/2022 28.0  20.0 - 31.0 mmol/L Final   ??? Anion Gap 06/05/2022 9  5 - 14 mmol/L Final   ??? BUN 06/05/2022 11  9 - 23 mg/dL Final   ??? Creatinine 06/05/2022 0.78  0.60 -  0.80 mg/dL Final   ??? BUN/Creatinine Ratio 06/05/2022 14   Final   ??? eGFR CKD-EPI (2021) Female 06/05/2022 >90  >=60 mL/min/1.61m2 Final    eGFR calculated with CKD-EPI 2021 equation in accordance with [employer] and [employer] of Nephrology Task Force recommendations.   ??? Glucose 06/05/2022 95  70 - 179 mg/dL Final   ??? Calcium 29/56/2130 9.6  8.7 - 10.4 mg/dL Final   ??? Albumin 86/57/8469 4.0  3.4 - 5.0 g/dL Final   ??? Total Protein 06/05/2022 7.6  5.7 - 8.2 g/dL Final   ??? Total Bilirubin 06/05/2022 0.8  0.3 - 1.2 mg/dL Final   ??? AST 62/95/2841 17  <=34 U/L Final   ??? ALT 06/05/2022 18  10 - 49 U/L Final   ??? Alkaline Phosphatase 06/05/2022 65  46 - 116 U/L Final   ??? Hemoglobin A1C 06/05/2022 4.9  4.8 - 5.6 % Final   ??? Estimated Average Glucose 06/05/2022 94  mg/dL Final   ??? Iron 32/44/0102 82  50 - 170 ug/dL Final   ??? TIBC 72/53/6644 378.0  250.0 - 425.0 ug/dL Final   ??? Transferrin 06/05/2022 300.0  250.0 - 380.0 mg/dL Final   ??? Iron Saturation (%) 06/05/2022 22  % Final   ??? Triglycerides 06/05/2022 179 (H)  0 - 150 mg/dL Final   ??? Cholesterol 06/05/2022 206 (H)  <=200 mg/dL Final   ??? HDL 03/47/4259 52  40 - 60 mg/dL Final   ??? LDL Calculated 06/05/2022 563 (H)  40 - 99 mg/dL Final    NHLBI Recommended Ranges, LDL Cholesterol, for Adults (20+yrs) (ATPIII), mg/dL  Optimal              <875  Near Optimal        100-129  Borderline High     130-159  High                160-189  Very High            >=190  NHLBI Recommended Ranges, LDL Cholesterol, for Children (2-19 yrs), mg/dL  Desirable            <643  Borderline High     110-129  High                 >=130     ??? VLDL Cholesterol Cal 06/05/2022 35.8  9 - 37 mg/dL Final   ??? Chol/HDL Ratio 06/05/2022 4.0  1.0 - 4.5 Final   ??? Non-HDL Cholesterol 06/05/2022 154 (H)  70 - 130 mg/dL Final    Non-HDL Cholesterol Recommended Ranges (mg/dL)  Optimal       <329  Near Optimal 130 - 159  Borderline High 160 - 189  High             190 - 219  Very High       >220     ??? FASTING 06/05/2022 Yes   Final   ??? Nicotine 06/05/2022 <3.0  <3.0 ng/mL Final   ??? Cotinine 06/05/2022 <3.0  <3.0 ng/mL Final       -------------------ADDITIONAL INFORMATION-------------------  This test was developed and its performance characteristics   determined by [facility] in a manner consistent with CLIA   requirements. This test has not been cleared or approved by   the U.S. Food and Drug Administration.     Test Performed by:  [facility]  [address] [place], [place], [place]  [address]  Lab Director: Paul Dykes M.D. Ph.D.; CLIA# [id number]   ??? Folate 06/05/2022 21.4  >=5.4 ng/mL Final   ??? TSH 06/05/2022 1.107  0.550 - 4.780 uIU/mL Final   ??? Amphetamines Screen, Ur 06/05/2022 Negative  <500 ng/mL Final   ??? Barbiturates Screen, Ur 06/05/2022 Negative  <200 ng/mL Final   ??? Benzodiazepines Screen, Urine 06/05/2022 Negative  <200 ng/mL Final   ??? Cannabinoids Screen, Ur 06/05/2022 Negative  <20 ng/mL Final   ??? Methadone Screen, Urine 06/05/2022 Negative  <300 ng/mL Final   ??? Cocaine(Metab.)Screen, Urine 06/05/2022 Negative  <150 ng/mL Final   ??? Opiates Screen, Ur 06/05/2022 Negative  <300 ng/mL Final   ??? Fentanyl Screen, Ur 06/05/2022 Negative  <1.0 ng/mL Final   ??? Oxycodone Screen, Ur 06/05/2022 Negative  <100 ng/mL Final   ??? Buprenorphine, Urine 06/05/2022 Negative  <5 ng/mL Final   ??? Vitamin B-12 06/05/2022 791  211 - 911 pg/ml Final   ??? Vitamin D Total (25OH) 06/05/2022 23.9  20.0 - 80.0 ng/mL Final       Follow-up:     Recommend well adult check/physical in 1 year. Otherwise, follow up as below.  Return in about 2 months (around 08/21/2022) for Weight clinic F/U one slot. pt has to have appt with Ms. Lennie Hummer in August to meet criteria for surgery.    Health maintenance reviewed and recommendations made based on [place] Preventative Task Force (USPTF) recommendations. Reviewed appropriate diet and exercise. Patient stated understanding and there were no barriers to learning.     @SIGN @    I attest that I, [employer], personally documented this note while acting as a scribe for [employer], MD.     [employer] [place], Scribe.     The documentation recorded by the scribe accurately reflects the service I personally performed and the decisions made by me.     Marshell Garfinkel, MD

## 2022-06-21 NOTE — Unmapped (Signed)
Attempted to call pt to change Cardiac Clearance appt. With Cassie on Monday July 31 to August 17 two days after she has her Echo.  LM for cb.

## 2022-06-25 NOTE — Unmapped (Signed)
[facility]  Established Patient Clinic Note    Assessment/Plan:   Wendy Chen is a 41 y.o.female    1. Contraceptive counseling  ?? Started POPs 02/2022, has had difficulty taking same time every day  ?? Given weight, age, and HTN, estrogen contraindicated  ?? Patient hoping for Nexplanon placement  ?? Will schedule visit for placement at soonest availability    Attending: Dr. Marilu Favre    Subjective   Wendy Chen is a 41 y.o. female  coming to clinic today for the following issues:    Chief Complaint   Patient presents with   ??? Contraception     Pt would like the nexapalon birth control      HPI:    # Contraception  - Previously on POPs  - Difficulty taking daily  - Given weight, age, and HTN, estrogen contraindicated.  - LMP 7/19-7/25  - Irregular periods  - Moderate bleeding    I have reviewed the problem list, medications, and allergies and have updated/reconciled them if needed.    Ms. Hedrich  reports that she has never smoked. She has never been exposed to tobacco smoke. She has never used smokeless tobacco.  Health Maintenance   Topic Date Due   ??? Pneumococcal Vaccine 0-64 (1 - PCV) Never done   ??? COVID-19 Vaccine (3 - Pfizer risk series) 04/12/2020   ??? Influenza Vaccine (1) 07/28/2022   ??? Serum Creatinine Monitoring  06/06/2023   ??? Potassium Monitoring  06/06/2023   ??? HPV Cotest with Pap Smear (21-65)  12/02/2026   ??? Pap Smear with Cotest HPV (21-65)  12/02/2026   ??? DTaP/Tdap/Td Vaccines (4 - Td or Tdap) 02/08/2027   ??? Lipid Screening  06/06/2027   ??? Hepatitis C Screen  Completed       Objective     VITALS: BP 112/76  - Pulse 62  - Temp 36.5 ??C (97.7 ??F) (Temporal)  - Ht 170.2 cm (5' 7)  - Wt (!) 144.2 kg (318 lb)  - LMP 05/19/2022 (Exact Date)  - BMI 49.81 kg/m??     Physical Exam  Vitals reviewed.   Cardiovascular:      Rate and Rhythm: Normal rate.   Pulmonary:      Effort: Pulmonary effort is normal.   Neurological:      General: No focal deficit present.   Psychiatric: Mood and Affect: Mood normal.         Behavior: Behavior normal.         LABS/IMAGING  Office Visit on 06/05/2022   Component Date Value   ??? WBC 06/05/2022 6.7    ??? RBC 06/05/2022 4.95    ??? HGB 06/05/2022 13.8    ??? HCT 06/05/2022 41.4    ??? MCV 06/05/2022 83.6    ??? MCH 06/05/2022 27.8    ??? MCHC 06/05/2022 33.3    ??? RDW 06/05/2022 14.3    ??? MPV 06/05/2022 7.7    ??? Platelet 06/05/2022 329    ??? Sodium 06/05/2022 139    ??? Potassium 06/05/2022 3.4    ??? Chloride 06/05/2022 102    ??? CO2 06/05/2022 28.0    ??? Anion Gap 06/05/2022 9    ??? BUN 06/05/2022 11    ??? Creatinine 06/05/2022 0.78    ??? BUN/Creatinine Ratio 06/05/2022 14    ??? eGFR CKD-EPI (2021) Fema* 06/05/2022 >90    ??? Glucose 06/05/2022 95    ??? Calcium 06/05/2022 9.6    ??? Albumin  06/05/2022 4.0    ??? Total Protein 06/05/2022 7.6    ??? Total Bilirubin 06/05/2022 0.8    ??? AST 06/05/2022 17    ??? ALT 06/05/2022 18    ??? Alkaline Phosphatase 06/05/2022 65    ??? Hemoglobin A1C 06/05/2022 4.9    ??? Estimated Average Glucose 06/05/2022 94    ??? Iron 06/05/2022 82    ??? TIBC 06/05/2022 378.0    ??? Transferrin 06/05/2022 300.0    ??? Iron Saturation (%) 06/05/2022 22    ??? Triglycerides 06/05/2022 179 (H)    ??? Cholesterol 06/05/2022 206 (H)    ??? HDL 06/05/2022 52    ??? LDL Calculated 06/05/2022 416 (H)    ??? VLDL Cholesterol Cal 06/05/2022 35.8    ??? Chol/HDL Ratio 06/05/2022 4.0    ??? Non-HDL Cholesterol 06/05/2022 154 (H)    ??? FASTING 06/05/2022 Yes    ??? Nicotine 06/05/2022 <3.0    ??? Cotinine 06/05/2022 <3.0    ??? Folate 06/05/2022 21.4    ??? TSH 06/05/2022 1.107    ??? Amphetamines Screen, Ur 06/05/2022 Negative    ??? Barbiturates Screen, Ur 06/05/2022 Negative    ??? Benzodiazepines Screen, * 06/05/2022 Negative    ??? Cannabinoids Screen, Ur 06/05/2022 Negative    ??? Methadone Screen, Urine 06/05/2022 Negative    ??? Cocaine(Metab.)Screen, U* 06/05/2022 Negative    ??? Opiates Screen, Ur 06/05/2022 Negative    ??? Fentanyl Screen, Ur 06/05/2022 Negative    ??? Oxycodone Screen, Ur 06/05/2022 Negative    ??? Buprenorphine, Urine 06/05/2022 Negative    ??? Vitamin B-12 06/05/2022 791    ??? Vitamin D Total (25OH) 06/05/2022 23.9        [facility] [place] [facility]  [address], [place], [place] [address] ??? Telephone [phone] ??? Fax [phone]  [email or web address]

## 2022-06-26 DIAGNOSIS — L732 Hidradenitis suppurativa: Principal | ICD-10-CM

## 2022-06-26 DIAGNOSIS — Z79899 Other long term (current) drug therapy: Principal | ICD-10-CM

## 2022-06-27 ENCOUNTER — Ambulatory Visit: Admit: 2022-06-27 | Discharge: 2022-06-27 | Payer: PRIVATE HEALTH INSURANCE

## 2022-06-27 DIAGNOSIS — Z79899 Other long term (current) drug therapy: Principal | ICD-10-CM

## 2022-06-30 LAB — QUANTIFERON TB GOLD PLUS
QUANTIFERON ANTIGEN 1 MINUS NIL: 0.09 [IU]/mL
QUANTIFERON ANTIGEN 2 MINUS NIL: 0.29 [IU]/mL
QUANTIFERON MITOGEN: 9.89 [IU]/mL
QUANTIFERON TB GOLD PLUS: NEGATIVE
QUANTIFERON TB NIL VALUE: 0.11 [IU]/mL

## 2022-06-30 LAB — TB AG2: TB AG2 VALUE: 0.4

## 2022-06-30 LAB — TB AG1: TB AG1 VALUE: 0.2

## 2022-06-30 LAB — TB NIL: TB NIL VALUE: 0.11

## 2022-06-30 LAB — TB MITOGEN: TB MITOGEN VALUE: 10

## 2022-07-03 MED ORDER — HUMIRA PEN CITRATE FREE 40 MG/0.4 ML
SUBCUTANEOUS | 3 refills | 84 days | Status: CP
Start: 2022-07-03 — End: 2023-07-03

## 2022-07-04 ENCOUNTER — Institutional Professional Consult (permissible substitution): Admit: 2022-07-04 | Discharge: 2022-07-05 | Payer: PRIVATE HEALTH INSURANCE

## 2022-07-04 NOTE — Unmapped (Signed)
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)    Hx of trauma (psychological)    Migraines    Heart murmur    Sleep apnea, obstructive    Encounter for Nexplanon removal    Class 3 obesity (CMS-HCC)    Hidradenitis suppurativa    Medication side effect    Tension headache    Low back pain    EIC (epidermal inclusion cyst)    Right carpal tunnel syndrome    Left carpal tunnel syndrome    Patellofemoral pain syndrome    LLQ pain    Hidradenitis    Incisional hernia    Mixed hyperlipidemia    Encounter for surveillance of contraceptive pills     Present height 170.2 cm (5' 7)  Present weight (!) 144.2 kg (318 lb) (verbal per patient)  Current BMI (!) 49.79    Weight Status Categories:  Underweight:  BMI < 18.5  Normal Weight:  BMI 18.5 - 24.9  Overweight:  BMI 25 - 29.9  Obesity Class I:  BMI 30 - 34.9  Obesity Class II:  BMI 35 - 39.9  Obesity Class III:  BMI ? 40    Weight History:  Wt Readings from Last 6 Encounters:   07/04/22 (!) 144.2 kg (318 lb)   06/27/22 (!) 144.2 kg (318 lb)   06/20/22 (!) 146.1 kg (322 lb 0.6 oz)   06/05/22 (!) 147.9 kg (326 lb 1.6 oz)   06/06/22 (!) 147.9 kg (326 lb 1 oz)   04/21/22 (!) 148.4 kg (327 lb 1.6 oz)     Patient weight is slowly trending down. This weight loss is welcomed and intended by patient.     Allergies:   No Known Allergies    Relevant Medications, Herbs, Supplements:    Current Outpatient Medications:     ascorbic acid, vitamin C, (VITAMIN C) 500 MG tablet, Take 1 tablet (500 mg total) by mouth daily., Disp: 14 tablet, Rfl: 0    chlorthalidone (HYGROTON) 50 MG tablet, Take 1 tablet (50 mg total) by mouth daily., Disp: 90 tablet, Rfl: 3    empty container Misc, Use as directed to dispose of Humira pens., Disp: 1 each, Rfl: 2    gabapentin (NEURONTIN) 100 MG capsule, Take 1 capsule (100 mg total) by mouth Three (3) times a day for 5 days., Disp: 15 capsule, Rfl: 0    HUMIRA PEN CITRATE FREE 40 MG/0.4 ML, Inject the contents of 2 pens (80 mg total) under the skin every seven (7) days. Maintenance dose., Disp: 24 each, Rfl: 3    metFORMIN (GLUCOPHAGE-XR) 500 MG 24 hr tablet, Take 1 tablet (500 mg total) by mouth Two (2) times a day., Disp: 120 tablet, Rfl: 3    norethindrone (ORTHO MICRONOR) 0.35 mg tablet, Take 1 tablet by mouth daily., Disp: 84 tablet, Rfl: 3    topiramate (TOPAMAX) 25 MG tablet, Take 1 tablet (25 mg total) by mouth nightly. (Patient not taking: Reported on 06/27/2022), Disp: 60 tablet, Rfl: 1    Do you have any concerns about taking or affording your medications?  None at this time.     Relevant Labs:  Hemoglobin A1C (%)   Date Value   06/05/2022 4.9   02/09/2022 4.9  01/02/2020 4.7 (L)      No components found for: LDLCALC   BP Readings from Last 3 Encounters:   06/27/22 112/76   06/20/22 123/74   06/05/22 143/87     Lab Results   Component Value Date    CHOL 206 (H) 06/05/2022    CHOL 217 (H) 01/02/2020     Lab Results   Component Value Date    HDL 52 06/05/2022    HDL 57 01/02/2020     Lab Results   Component Value Date    LDL 118 (H) 06/05/2022    LDL 135 (H) 01/02/2020     Lab Results   Component Value Date    VLDL 35.8 06/05/2022    VLDL 24.8 01/02/2020     Lab Results   Component Value Date    CHOLHDLRATIO 4.0 06/05/2022    CHOLHDLRATIO 3.8 01/02/2020     Lab Results   Component Value Date    TRIG 179 (H) 06/05/2022    TRIG 124 01/02/2020     Lab Results   Component Value Date    VITDTOTAL 23.9 06/05/2022     Lab Results   Component Value Date    VITAMINB12 791 06/05/2022     Physical Activity:  Patient noted that she tries her best to go to [employer] about every other day. Patient noted that she does a circuit work out and will use the treadmill. RD complimented patient and encouraged patient to continue this healthy habit.     24-Hour recall/usual intake:    Time Intake   Breakfast Overnight oats   Lunch Sandwich    Snack (PM) Pop corn   Yahoo, green beans,    Snack (HS) Pickles      Other Nutrition Information:  RD called patient to complete her nutritional appointment. Patient noted that she would like to learn how to eat healthfully and try to lose weight.   Patient noted that she often will crave vinegar. Patient noted that she likes to snack on pickles.   RD reviewed some ideas for patient to meal prep for healthy and some easy frozen meals to keep on hand. RD also showed patient the site Eat This Much.     Health Maintenance Due   Topic Date Due    Pneumococcal Vaccine 0-64 (1 - PCV) Never done    COVID-19 Vaccine (3 - Pfizer risk series) 04/12/2020     # of meals/day: 3  # of snacks/day: 1 to 2   Portions: Fair       Beverages: water (~2 bottles of water)     Protein: eggs, premier protein, chicken, pork     Starch: oats      Prior nutrition education? has seen an RD in the past     Who prepares the food at home?  Patient  Who does the grocery shopping? Patient     Stress: Mild     Sleep: Poor to okay   -- Patient noted that she needs a CPAP at night to help her sleep better.     Estimated Daily Needs:  2000 to 2500 calories (Mifflin-St Jeor equation for BMI > 25 using actual body weight, adjusted for weight loss)  90 to 120 g protein    Nutrition Diagnosis:  Overweight/obesity related to previous consumption of unbalanced meals and previous consumption of larger portions as evidenced by BMI >45.    Nutrition Intervention:  Nutrition Education  Nutrition Counseling  Materials Provided:  List of recommendations    Tips and recipes    Social Determinants of Health interventions provided: N/A - no concerns identified.     Patient Stated Nutrition Goals:  -- Aim to drink 3 bottles of water per day.   -- Reduce intake of orange juice. Limit to 4 to 8 oz per day.   -- Continue to eat healthy - focusing on protein and fiber dense foods at meals. Goals Added to Visit Navigator?  Yes    Monitoring/Evaluation:  Progress towards goals:  baseline    Follow-up:  RD will follow up with patient in about 5 weeks. RD encouraged patient to reach out to this worker with any nutritional questions, concerns, or struggles.     PCP: Lilian Kapur, MD  Address on File: [address], [place] [address], [facility]      Verified as Current Location: Yes  Extended Emergency Contact Information  Primary Emergency Contact: Ronalee [place]  Home Phone: [phone]  Mobile Phone: [phone]  Relation: Mother  Secondary Emergency Contact: Lonia Skinner  Address: [address]           [place], [place] [address] [place]  Mobile Phone: [phone]  Relation: Sister     Verified Behavioral Health Emergency Contact: N/A      The patient reports they are currently: at home. I spent 8 minutes on the phone with the patient on the date of service. I spent an additional 10 minutes on pre- and post-visit activities on the date of service.     The patient was physically located in [place] or a state in which I am permitted to provide care. The patient and/or parent/guardian understood that s/he may incur co-pays and cost sharing, and agreed to the telemedicine visit. The visit was reasonable and appropriate under the circumstances given the patient's presentation at the time.    The patient and/or parent/guardian has been advised of the potential risks and limitations of this mode of treatment (including, but not limited to, the absence of in-person examination) and has agreed to be treated using telemedicine. The patient's/patient's family's questions regarding telemedicine have been answered.     If the visit was completed in an ambulatory setting, the patient and/or parent/guardian has also been advised to contact their provider???s office for worsening conditions, and seek emergency medical treatment and/or call 911 if the patient deems either necessary.        Leonette Most, RD LDN     Length of visit was 8 minutes    1 unit(s) billed per insurance     Leonette Most, RD LDN

## 2022-07-05 NOTE — Unmapped (Addendum)
--   Aim to drink 3 bottles of water per day.   -- Reduce intake of orange juice. Limit to 4 to 8 oz per day.   -- Continue to eat healthy - focusing on protein and fiber dense foods at meals.

## 2022-07-07 ENCOUNTER — Ambulatory Visit: Admit: 2022-07-07 | Discharge: 2022-07-08 | Payer: PRIVATE HEALTH INSURANCE

## 2022-07-07 MED ADMIN — etonogestreL (NEXPLANON) implant 68 mg: 68 mg | SUBCUTANEOUS | @ 21:00:00 | Stop: 2022-07-07

## 2022-07-07 NOTE — Unmapped (Signed)
Post-operative Instructions and Plan:   Remove pressure dressing after 24 hrs, and small bandage over removal site after 3 days (though this typically falls off on its own)  Observe the site for bleeding, discharge, redness, or swelling and call if any of these occur.

## 2022-07-07 NOTE — Unmapped (Signed)
Nexplanon Insertion Procedure Note    Indication: Contraception    Resident Physician:  Illene Regulus. Leonor Liv, MD  Attending Physician:  Alvester Morin, MD    Pre-operative Diagnosis: Contraceptive Implant Insertion  Post-operative Diagnosis: same    LMP:  06/14/22  Urine HCG:  Negative  Insertion between days 1-5 of cycle: no    Allergies to anesthetics/antiseptics:  None    Consent:    Risks (bleeding, infection, scarring, device migration, failure of contraception, menstrual abnormalities, etc.), benefits (contraception, convenience/ease of use), and alternatives (ie, other contraceptive methods) were reviewed.      Time-out:  Performed immediately prior to procedure      Procedure Details:  Patient positioned on her back on exam table with left arm flexed at elbow and externally rotated with wrist parallel to ear.  Insertion site identified inner side of non-dominant arm about 8-10 cm above medial epicondyle of humerus.   Insertion site and guide locations marked.  Insertion site cleansed with chlorhexidine.  Insertion area anesthetized with 2mL of 2% Lidocaine w/epi just under skin along planned insertion site.  Nexplanon applicator removed from blister.   Visual confirmation of Nexplanon presence made.   Needle shield removed keeping tip up.  Counter-traction applied to skin around insertion site.  Tip of needle, bevel side up, inserted into skin at 20 degree angle.  Applicator lowered to horizontal position and while tenting skin, needle inserted to its full length keeping the needle in the subdermal connective tissue.  Seal of applicator broken by pressing the obturator support.  Obturator turned 90 degrees with respect to the needle.  While holding obturator in fixed position, cannula fully retracted.  Grooved tip of obturator visualized inside needle.  Pt's arm palpated with confirmation of presence of Implanon 4cm rod.  Small adhesive bandage placed over insertion site.  Pt palpated Implanon.  Sterile gauze pressure dressing applied to arm.   Pt tolerated procedure well.   Nexplanon User Card completed and given to pt.    Post-operative Instructions and Plan:   Pt instructed to remove pressure dressing after 24 hrs, and small bandage over insertion site after 3 days.  Pt advised 100% condom use for 7 days post-placement.     Nexplanon Lot number:  [id number]  Exp date:  04/19/2024  Removal date:  07/06/2025

## 2022-07-07 NOTE — Unmapped (Deleted)
[facility]  Established Patient Clinic Note    Assessment/Plan:   [name] is a 41 y.o.female    {a/p options:52763}    Attending: Dr. Marland Kitchen    Subjective   Wendy Chen is a 41 y.o. female  coming to clinic today for the following issues:    Chief Complaint   Patient presents with   ??? Contraception     HPI:    #  - ***    #  - ***    I have reviewed the problem list, medications, and allergies and have updated/reconciled them if needed.    Wendy Chen  reports that she has never smoked. She has never been exposed to tobacco smoke. She has never used smokeless tobacco.  Health Maintenance   Topic Date Due   ??? Pneumococcal Vaccine 0-64 (1 - PCV) Never done   ??? COVID-19 Vaccine (3 - Pfizer risk series) 04/12/2020   ??? Influenza Vaccine (1) 07/28/2022   ??? Serum Creatinine Monitoring  06/06/2023   ??? Potassium Monitoring  06/06/2023   ??? HPV Cotest with Pap Smear (21-65)  12/02/2026   ??? Pap Smear with Cotest HPV (21-65)  12/02/2026   ??? DTaP/Tdap/Td Vaccines (4 - Td or Tdap) 02/08/2027   ??? Lipid Screening  06/06/2027   ??? Hepatitis C Screen  Completed       Objective     VITALS: BP 131/91 (BP Site: L Arm, BP Position: Sitting, BP Cuff Size: Medium) Comment: avg bp - Pulse 58  - Temp 36.1 ??C (97 ??F) (Temporal)  - Ht 170.2 cm (5' 7.01)  - Wt (!) 142.9 kg (315 lb)  - LMP 06/20/2022 (Exact Date)  - BMI 49.32 kg/m??     Physical Exam    LABS/IMAGING  {Labs/Imaging options:52765}    [facility]  [place] at [facility]  [address], [place], [place] [address] ??? Telephone [phone] ??? Fax [phone]  [email or web address]

## 2022-07-08 NOTE — Unmapped (Signed)
I was present for the entirety of the procedure(s). Charlyne Petrin, MD

## 2022-07-08 NOTE — Unmapped (Signed)
Addended byAlvester Morin on: 07/07/2022 05:18 PM     Modules accepted: Orders

## 2022-07-10 DIAGNOSIS — L732 Hidradenitis suppurativa: Principal | ICD-10-CM

## 2022-07-10 MED ORDER — HUMIRA PEN CITRATE FREE 40 MG/0.4 ML
SUBCUTANEOUS | 3 refills | 84 days | Status: CP
Start: 2022-07-10 — End: 2023-07-10
  Filled 2022-07-19: qty 4, 28d supply, fill #0

## 2022-07-11 ENCOUNTER — Ambulatory Visit: Admit: 2022-07-11 | Discharge: 2022-07-12 | Payer: PRIVATE HEALTH INSURANCE

## 2022-07-12 ENCOUNTER — Telehealth
Admit: 2022-07-12 | Discharge: 2022-07-13 | Payer: PRIVATE HEALTH INSURANCE | Attending: Registered" | Primary: Registered"

## 2022-07-12 NOTE — Unmapped (Signed)
[facility] Outpatient Nutrition Services   Pre-Op Bariatric Medical Nutrition Therapy Consultation       Visit Type: Return Bariatric Nutrition Assessment    Referral Reason: obesity      Wendy Chen is a 41 y.o. female seen for medical nutrition therapy for weight loss surgery. Her medication list, labs, and typical intake were reviewed.     Her interim medical history is significant for HTN, Sleep apnea, and Obesity.    Anthropometrics   Height: 170.2 cm (5' 7.01)   Weight: (!) 142.9 kg (315 lb 0.6 oz)  Body mass index is 49.33 kg/m??.    Wt Readings from Last 12 Encounters:   07/12/22 (!) 142.9 kg (315 lb 0.6 oz)   07/07/22 (!) 142.9 kg (315 lb)   07/04/22 (!) 144.2 kg (318 lb)   06/27/22 (!) 144.2 kg (318 lb)   06/20/22 (!) 146.1 kg (322 lb 0.6 oz)   06/05/22 (!) 147.9 kg (326 lb 1.6 oz)   06/06/22 (!) 147.9 kg (326 lb 1 oz)   04/21/22 (!) 148.4 kg (327 lb 1.6 oz)   04/19/22 (!) 148.8 kg (328 lb 1.3 oz)   03/23/22 (!) 151.6 kg (334 lb 3.2 oz)   02/09/22 (!) 153.3 kg (338 lb)   01/11/22 (!) 154.9 kg (341 lb 8 oz)        Ideal Body Weight: 61.29 kg  Weight in (lb) to have BMI = 25: 159.3  Goal weight (per patient): BMI: 30   Weight Change: -5kg since last RD visit    Nutrition Risk Screening:   Food Insecurity: No Food Insecurity (07/07/2022)    Hunger Vital Sign     Worried About Running Out of Food in the Last Year: Never true     Ran Out of Food in the Last Year: Never true       Nutrition Focused Physical Exam:  Nutrition Evaluation  Overall Impressions: Nutrition-Focused Physical Exam not indicated due to lack of malnutrition risk factors. (07/12/22 1047)  Nutrition Designation: Obese class III  (BMI > 39.99 kg/m2) (07/12/22 1047)    Malnutrition Screening:   Patient does not meet AND/ASPEN criteria for malnutrition at this time (07/12/22 1047)    Biochemical Data, Medical Tests and Procedures:  All pertinent labs and imaging reviewed by Philomena Course at 4:11 PM 07/12/2022.    Lab Results Component Value Date    HGB 13.8 06/05/2022    HCT 41.4 06/05/2022    MCV 83.6 06/05/2022    IRON 82 06/05/2022    TIBC 378.0 06/05/2022    VITAMINB12 791 06/05/2022    FOLATE 21.4 06/05/2022    VITDTOTAL 23.9 06/05/2022     Lab Results   Component Value Date    ALKPHOS 65 06/05/2022    ALT 18 06/05/2022    AST 17 06/05/2022    BUN 11 06/05/2022    CREATININE 0.78 06/05/2022    GFRNONAA >=60 10/25/2017    GFRAA >=60 10/25/2017     Lab Results   Component Value Date    CHOL 206 (H) 06/05/2022    HDL 52 06/05/2022    LDL 118 (H) 06/05/2022    NONHDL 154 (H) 06/05/2022    TRIG 179 (H) 06/05/2022    A1C 4.9 06/05/2022    TSH 1.107 06/05/2022       New developments since last nutrition appointment:  Patient Updates: Patient reports that Dr. Loa Socks put her on a stronger pill for weight loss, but she read  the side effects and didn't start taking it.    Patient has been working on the following Bariatric Dietary Behaviors:  Not drinking with meals and waiting for 30 minutes after - progressing  Focusing on protein rich foods at meals/snacks - accomplished  Mindful eating - progressing  Eliminating carbonated/sugar sweetened beverages - progressing  Choosing a protein shake and a bariatric vitamins - progressing   Protein shake: Premier                             Bariatric Multivitamin: purchased Flintstones  7.   Maintaining a regular exercise regimen - accomplished   Type: [employer] - treadmill + circuit   Duration: 50 minutes   Frequency: 7x/wk   Physical Activity:  Physical activity level is moderate with 3-5 days of exercise.     Medications and Vitamin/Mineral Supplementation:   All nutritionally pertinent medications reviewed on 07/12/2022.   Nutritionally pertinent medications include: metformin, Humira     She is taking nutrition supplements. multivitamin    Current Outpatient Medications   Medication Sig Dispense Refill    ascorbic acid, vitamin C, (VITAMIN C) 500 MG tablet Take 1 tablet (500 mg total) by mouth daily. 14 tablet 0    chlorthalidone (HYGROTON) 50 MG tablet Take 1 tablet (50 mg total) by mouth daily. 90 tablet 3    empty container Misc Use as directed to dispose of Humira pens. 1 each 2    gabapentin (NEURONTIN) 100 MG capsule Take 1 capsule (100 mg total) by mouth Three (3) times a day for 5 days. 15 capsule 0    HUMIRA PEN CITRATE FREE 40 MG/0.4 ML Inject the contents of 1 pen (40 mg total) under the skin every seven (7) days. 12 each 3    metFORMIN (GLUCOPHAGE-XR) 500 MG 24 hr tablet Take 1 tablet (500 mg total) by mouth Two (2) times a day. 120 tablet 3     No current facility-administered medications for this visit.       Nutrition History:     Dietary Restrictions: No known food allergies or food intolerances.     Gastrointestinal Issues: Denied issues    Hunger and Satiety: Denied issues.     24-Hour Recall/Usual Intake:  Up: 5:00am - when goes back to work  B 11:00am: scrambled eggs or bacon/sausage or overnight oats (oatmeal + chia seeds + almond milk + berries + protein powder)  S   L 1-2:00pm: [place] hot dogs with bun or chicken thigh + salad  S pickles  D 8-9:00pm: (go to mom's) chicken + mac and cheese or spaghetti + meat sauce  HS might have popcorn or a pickle    Food-Related History:  Beverages: over 64oz water, occasional soda, orange juice  Dining Out: once since last visit  Tracking protein? Just trying to get it in at meals.  Meal Schedule: meals: 3, snacks: 1-2     Eating Behaviors:  Excessive Hunger: Denies  Emotional Eating: Denied issues.   Grazing: Denied issues.   Cravings: pickles   Nighttime Eating: None.  Fast Eating:  I am trying to slow down.   Overeating: Denied issues with overeating.     Nutritional Needs:  Energy: 2122 kcals [Per [place]-Jeor Equation (AF: 1.0 for weight loss) using last recorded weight, 142.9 kg (07/12/22 1059)]  Protein: 72-87 gm [1.0-1.2 gm/kg using adjusted body weight (for a BMI: 25), 72.4 kg (07/12/22 1059)]  Carbohydrate:   [< /  equal to 45% of kcal]  Fluid: 2000-3000 [ ]       Nutrition Goals & Evaluation      Demonstrate understanding of bariatric nutrition  (Ongoing and Progressing)  Demonstrate behavioral change  (Ongoing and Progressing)  Meet nutritional needs (Met and Ongoing)  Compliance with nutrition goals (Ongoing and Progressing)  (0.5-1.0)#/wk weight loss since last RD visit (Met and Ongoing)    Nutrition goals reviewed, and relevant barriers identified and addressed: knowledge deficit . She is evaluated to have good to fair willingness and ability to achieve nutrition goals.     Nutrition Assessment       The patient is currently likely meeting estimated nutrition needs related to general compliance with previously set nutrition goals. Per anthropometric data, BMI: 49.33 classifies patient as obese (class 3). EMR indicates a 5kg weight loss since last RD visit. Labs (07/10) did not reveal any micronutrient deficiencies for those tested. She has been successful with previous nutrition recommendations. She is eating 3 meals per day. Patient is focused on protein at meals and would benefit from continuing to decrease carbohydrates. Beverages continue to contain sugar and may occasionally be carbonated - patient is aware that sugar free, non-carbonated beverages are recommended exclusively. She is practicing not drinking with meals, however, this remains a challenge. She is participating in a regular exercise regimen. Discussed with patient increasing intensity to maximize weight loss results as patient stated her workouts were easy She has not selected an appropriate bariatric multivitamin. Patient reports that she was told Flintstones chewables were an acceptable vitamin choice. Education provided as to why this is not a good option - patient expressed a verbal understanding and was amenable to suggestions.          Bariatric Surgery Assessment: RD is withholding assessment for insurance approval for weight loss surgery pending patient follow-up where success with plan goals and food record/usual intake/24-hour recall will be assessed.           Nutrition Intervention      - Nutrition Education: bariatric surgery diet/eating style. Education resources provided include: after visit summary with patient instructions   Nutrition Tracking/Logging Tool: She was provided registration information for the [employer] nutrition management mobile application at initial visit.    Nutrition Plan:   Please see patient instructions.    Patient will need the nutrition class prior to RD approval for bariatric surgery. Patient scheduled for in person class on 07/14/22. Patient will also need an additional follow-up visit after class to assess adherence to nutrition goals and understanding of the pre op diet, post op diet progression, protein/fluid goals, and exercise/vitamin recommendations.      Food/Nutrition-related history, Anthropometric measurements, Biochemical data, medical tests, procedures, Patient understanding or compliance with intervention and recommendations , and Effectiveness of nutrition interventions will be assessed at time of follow-up.       Recommendations for Clinical Team:  Please encourage a consistent meal/snack schedule with a focus on protein. Exercise outside of daily routine should also be encouraged for 150-300 minutes per week.       The patient reports they are currently: at home. I spent 27 minutes on the real-time audio and video visit with the patient on the date of service. I spent an additional 15 minutes on pre- and post-visit activities on the date of service.     The patient was not located and I was located within 250 yards of a hospital based location during the real-time audio and video visit. The  patient was physically located in [place] or a state in which I am permitted to provide care. The patient and/or parent/guardian understood that s/he may incur co-pays and cost sharing, and agreed to the telemedicine visit. The visit was reasonable and appropriate under the circumstances given the patient's presentation at the time.    The patient and/or parent/guardian has been advised of the potential risks and limitations of this mode of treatment (including, but not limited to, the absence of in-person examination) and has agreed to be treated using telemedicine. The patient's/patient's family's questions regarding telemedicine have been answered.    If the visit was completed in an ambulatory setting, the patient and/or parent/guardian has also been advised to contact their provider???s office for worsening conditions, and seek emergency medical treatment and/or call 911 if the patient deems either necessary.      I am located on-site and the patient is located off-site for this visit.

## 2022-07-12 NOTE — Unmapped (Signed)
[facility] Refill Coordination Note    Specialty Medication(s) to be Shipped:   Inflammatory Disorders: Humira    Other medication(s) to be shipped:  metformin     Wendy Chen, DOB: [birth year]  Phone: [phone] (home)       All above HIPAA information was verified with patient.     Was a Nurse, learning disability used for this call? No    Completed refill call assessment today to schedule patient's medication shipment from the [facility] [phone]).  All relevant notes have been reviewed.     Specialty medication(s) and dose(s) confirmed: Regimen is correct and unchanged.   Changes to medications: Abbiegale reports no changes at this time.  Changes to insurance: No  New side effects reported not previously addressed with a pharmacist or physician: None reported  Questions for the pharmacist: No    Confirmed patient received a Conservation officer, historic buildings and a Surveyor, mining with first shipment. The patient will receive a drug information handout for each medication shipped and additional FDA Medication Guides as required.       DISEASE/MEDICATION-SPECIFIC INFORMATION        For patients on injectable medications: Patient currently has 1 doses left.  Next injection is scheduled for 07/14/22.    SPECIALTY MEDICATION ADHERENCE     Medication Adherence    Patient reported X missed doses in the last month: 0  Specialty Medication: Humira  Patient is on additional specialty medications: No  Informant: patient  Reliability of informant: reliable                                Were doses missed due to medication being on hold? No    Humira 40/0.4 mg/ml: 2 days of medicine on hand        REFERRAL TO PHARMACIST     Referral to the pharmacist: Not needed      [facility]     Shipping address confirmed in Epic.     Delivery Scheduled: Yes, Expected medication delivery date: 07/19/22.     Medication will be delivered via Same Day Courier to the prescription address in Epic WAM.    Unk Lightning   [facility] Specialty Technician

## 2022-07-12 NOTE — Unmapped (Signed)
GOALS:    Continue with a consistent meal/snack schedule   - protein every 3-4 hours   - continue to decrease carbohydrate content in favor of vegetables    Continue to practice not drinking with meals and waiting for 30 minutes before you start drinking again    Continue to eliminate sugar sweetened and carbonated beverages (juice/soda)     Choose a bariatric vitamin from the chart provided in the binder   - Flintstones does not meet ASMBS guidelines    Continue with current exercise    - increase intensity of walking   - continue to include cardio + resistance/strength training   - GOAL: 150-300 minutes per week    Continue to review bariatric surgery nutrition information prior to nutrition class visit

## 2022-07-13 ENCOUNTER — Ambulatory Visit
Admit: 2022-07-13 | Discharge: 2022-07-14 | Payer: PRIVATE HEALTH INSURANCE | Attending: Adult Health | Primary: Adult Health

## 2022-07-13 NOTE — Unmapped (Signed)
DIVISION OF CARDIOLOGY   [employer] [place]        Date of Service: 07/13/2022    Assessment/Plan     1. Preop cardiovascular exam    2. Essential hypertension    3. Obstructive sleep apnea    4. BMI 50.0-59.9, adult (CMS-HCC)       Wendy Chen is a 41 year old female who is being evaluated for bariatric surgery.  She had echocardiogram yesterday that showed a structurally normal heart.  She has reasonably good exercise tolerance, can walk up 2 flights of stairs without stopping, activity level greater than 4 METS.  Has some mild exertional dyspnea that is felt to be due to deconditioning/obesity.  EKG unremarkable.  Her sleep apnea is appropriately treated and BP is recently well controlled on chlorthalidone.  Feel she is at low risk for cardiovascular complication in setting of intermediate risk surgery.  No further cardiac evaluation is warranted at this time.    Return to clinic:    Return if symptoms worsen or fail to improve.    I personally spent 31 minutes face-to-face and non-face-to-face in the care of this patient, which includes all pre, intra, and post visit time on the date of service.      Subjective:   ZOX:[id number] Loma Messing, MD  Chief complaint:  41 y.o. female with hx of obesity, OSA, HLD who is referred by Casimer Bilis, MD    History of Present Illness:  [name] is being evaluated for bariatric surgery.   Uses cpap nightly, just started.    She had echo yesterday which was overall normal.     She goes to gym - does treadmill for 20-30 min and then circuit training. Gets some mild SOB but doesn't have to stop.  Can walk up 2 flights of stairs without stopping.  Denies chest pain, lightheadedness/dizziness, swelling, palpitations.    No smoking hx    FHx - brother w/ CHF and MI in 24s. Mother w/ enlarged heart. Grandmother died of MI.      She works as a Engineer, production. 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)    Hx of trauma (psychological)    Migraines    Heart murmur    Sleep apnea, obstructive    Nexplanon in place    Class 3 obesity (CMS-HCC)    Hidradenitis suppurativa    Medication side effect    Tension headache    Low back pain    EIC (epidermal inclusion cyst)    Right carpal tunnel syndrome    Left carpal tunnel syndrome    Patellofemoral pain syndrome    LLQ pain    Hidradenitis    Incisional hernia    Mixed hyperlipidemia    Encounter for surveillance of contraceptive pills       Medications:  Current Outpatient Medications   Medication Sig Dispense Refill    chlorthalidone (HYGROTON) 50 MG tablet Take 1 tablet (50 mg total) by mouth daily. 90 tablet 3    empty container Misc Use as directed to dispose of Humira pens. 1 each 2    HUMIRA PEN CITRATE FREE 40 MG/0.4 ML Inject the contents of 1 pen (40 mg total) under the skin every seven (7) days. 12 each 3    metFORMIN (GLUCOPHAGE-XR) 500 MG 24 hr tablet Take  1 tablet (500 mg total) by mouth Two (2) times a day. 120 tablet 3     No current facility-administered medications for this visit.       Allergies  No Known Allergies    Social History:   Social History     Tobacco Use    Smoking status: Never     Passive exposure: Never    Smokeless tobacco: Never   Vaping Use    Vaping Use: Never used   Substance Use Topics    Alcohol use: No     Alcohol/week: 0.0 standard drinks of alcohol    Drug use: No       Family History:  Family History   Problem Relation Age of Onset    Diabetes Mother     Hypertension Mother     Hypertension Father     Diabetes Father     Hypertension Sister     Stroke Brother     Seizures Brother     Heart attack Brother     Hypertension Brother     Cancer Maternal Uncle         bone, colon    Alzheimer's disease Paternal Uncle     Colon cancer Paternal Uncle     Heart murmur Maternal Grandmother     Diabetes Maternal Grandmother     Diabetes Maternal Grandfather Heart murmur Maternal Grandfather     Anesthesia problems Neg Hx     Bleeding Disorder Neg Hx     Melanoma Neg Hx     Basal cell carcinoma Neg Hx     Squamous cell carcinoma Neg Hx        ROS- 12 system review is negative other than what is specified in the History of Present Illness.      Objective:   Physical Exam  Vitals:    07/13/22 1409   BP: 134/77   BP Site: R Arm   BP Position: Sitting   Pulse: 66   SpO2: 97%   Weight: (!) 144.2 kg (318 lb)   Height: 170.2 cm (5' 7)     Wt Readings from Last 3 Encounters:   07/13/22 (!) 144.2 kg (318 lb)   07/12/22 (!) 142.9 kg (315 lb 0.6 oz)   07/07/22 (!) 142.9 kg (315 lb)     General-  Patient is well-appearing in no acute distress  Neurologic- Alert and oriented X3.  Cranial nerve II-XII grossly intact.  HEENT-  Normocephalic atraumatic head.  No scleral icterus.  Wearing mask  Neck- Supple, no carotid bruis, no JVD  Lungs- clear to auscultation, no wheezes, rhonchi, or rhales.  Heart-RRR, normal S1-S2, no MRG  Abdomen- Soft, nontender, no organomegally.  Extremities-  No clubbing or cyanosis.  No LE edema  Pulses-2+ pulses in radial and dorsalis pedis bilaterally.  Psych- Normal mood, appropriate.      Laboratory data:  No results found for: PROBNP    Lab Results   Component Value Date    WBC 6.7 06/05/2022    HGB 13.8 06/05/2022    HCT 41.4 06/05/2022    PLT 329 06/05/2022       Lab Results   Component Value Date    NA 139 06/05/2022    K 3.4 06/05/2022    CL 102 06/05/2022    CO2 28.0 06/05/2022    BUN 11 06/05/2022    CREATININE 0.78 06/05/2022    GLU 95 06/05/2022    CALCIUM 9.6 06/05/2022    MG  1.7 09/30/2018       Lab Results   Component Value Date    TSH 1.107 06/05/2022    ALBUMIN 4.0 06/05/2022    ALT 18 06/05/2022    AST 17 06/05/2022       Lab Results   Component Value Date    LDL 118 (H) 06/05/2022    HDL 52 06/05/2022       Electrocardiogram:  From today 07/13/2022 shows normal sinus rhythm with nonspecific T wave abnormality      Echocardiogram 07/11/22:  1. The left ventricle is normal in size with mildly increased wall  thickness.    2. The left ventricular systolic function is normal, LVEF is visually  estimated at 55-60%.    3. The right ventricle is normal in size, with normal systolic function.      Glade Stanford, AGNP-C  Cardiology Nurse Practitioner  [facility]

## 2022-07-14 ENCOUNTER — Ambulatory Visit
Admit: 2022-07-14 | Discharge: 2022-07-15 | Payer: PRIVATE HEALTH INSURANCE | Attending: Registered" | Primary: Registered"

## 2022-07-15 NOTE — Unmapped (Signed)
[facility] Adult Outpatient Nutrition  Bariatric Nutrition Class  Wendy Chen is a 41 y.o. female seen for medical nutrition therapy.    Referring Provider or Clinic:  [facility] Bariatric Surgery    Primary Care Provider:  Lilian Kapur, MD    Reason for Referral:  Nutrition education prior to weight loss surgery    Past Medical History:  Past Medical History:   Diagnosis Date    AMA age 24 10/04/2016    We discussed the patient's age related risk of aneuploidy.  We discussed screening options including serum screening and NIPT as well as diagnostic options of CVS and amniocentesis.  We discussed risks, benefits and diagnostic capability of both CVS and amniocentesis.   -ordered genetic consult and first trimester screen  -Age 29 at [facility] -S/p genetic counseling 11/17 -Negative cfDNA -Fetal fraction 1.5% (Fetal fraction is affected by fetal aneuploidy (trisomy 13 and 18), maternal BMI, and other maternal conditions such as preexisting hypertension. In the setting of low fetal fraction (typically defined in the literature as <4%) sensitivity of cfDNA screening may be reduced Janyth Contes, 2015. Reprod Sci).      Heart murmur     benign, no abx prophylaxis required, 2012 echo noted trivial TR    Hidradenitis suppurativa 2018    Hypertension     Migraine     Obesity     Postpartum depression 2007, 2012    Procreative genetic counseling 10/05/2016    Genetic counseling visit on 10/09/16 Aneuploidy screening/ testing: cfDNA; results negative - low fetal fraction Carrier screening: [x]  Cystic fibrosis - neg x 106 mutationss [x]  Spinal muscular atrophy - 3 copies; reduced carrier risk [x]  Hemoglobinopathy screening - normal adult hemoglobin present      Sleep apnea     Snoring 12/08/2016     Past Surgical History:   Procedure Laterality Date    CESAREAN SECTION  2007, 2012    PR CESAREAN DELIVERY ONLY N/A 04/03/2017    Procedure: CESAREAN DELIVERY ONLY;  Surgeon: Barb Merino, MD;  Location: L&D C-SECTION OR SUITES [facility]; Service: Family Planning    PR REVISE MEDIAN N/CARPAL TUNNEL SURG Right 03/02/2021    Procedure: NEUROPLASTY AND/OR TRANSPOSITION; MEDIAN NERVE AT CARPAL TUNNEL;  Surgeon: Daisy Lazar, MD;  Location: ASC OR [facility];  Service: Orthopedics    PR REVISE MEDIAN N/CARPAL TUNNEL SURG Left 03/16/2021    Procedure: NEUROPLASTY AND/OR TRANSPOSITION; MEDIAN NERVE AT CARPAL TUNNEL;  Surgeon: Daisy Lazar, MD;  Location: ASC OR [facility];  Service: Orthopedics    WISDOM TOOTH EXTRACTION         Weight History:  Wt Readings from Last 5 Encounters:   07/13/22 (!) 144.2 kg (318 lb)   07/12/22 (!) 142.9 kg (315 lb 0.6 oz)   07/07/22 (!) 142.9 kg (315 lb)   07/04/22 (!) 144.2 kg (318 lb)   06/27/22 (!) 144.2 kg (318 lb)     BMI Readings from Last 5 Encounters:   07/13/22 49.81 kg/m??   07/12/22 49.33 kg/m??   07/07/22 49.32 kg/m??   07/04/22 49.81 kg/m??   06/27/22 49.81 kg/m??       Patient Education:  Discussed pre-op dietary recommendations including meal/snack consistency, eating by the clock, focusing on protein intake, appropriate food choices, choosing sugar free, noncarbonated beverages, not drinking with meals, vitamin supplementation, and regular exercise. Also discussed mindful eating strategies. Discussed the importance of incorporating these behaviors to optimize success after surgery.  We have reviewed all phases of  the diet including full liquids, pureed, and soft foods. Discussed appropriate food choices and portion sizes for each diet stage. Discussed the importance of getting adequate protein (60-80g) and fluids (64 oz), the need to establish a schedule of multiple small meals in order to prevent meal skipping and grazing, the need to avoid high sugar and high fat foods and beverages to prevent dumping syndrome and optimize weight loss. Discussed the importance of choosing sugar free, noncarbonated beverages along with practicing not drinking with meals and waiting 30 minutes after the meal before drinking.   Discussed how to read a food label in order to identify appropriate food choices.   The patient was advised of the need to take supplemental vitamins and minerals to meet nutrition needs. ASMBS guidelines were followed.  The patient was provided with information on protein supplement drinks and powders that will be helpful in meeting daily protein goals. Also, the patient was given sample menus and recipes for more ideas.    Nutrition Goals:  Demonstrate understanding of bariatric nutrition  Demonstrate behavioral change  Meet nutritional needs   Reduce long-term health risk  Compliance with previously set nutrition goals    Materials Provided:  List of recommendations    Handout explaining prescribed diet  Tips and recipes  Patient was given the bariatric binder at initial visit    Expected Compliance:  Comprehension of plan is good.  Readiness for change is good.  Ability to meet goals is good.    Patient answered 7/8 questions correctly on the post-class quiz.    Follow up:  With RD to assess compliance with dietary goals and recommendations    Length of visit was 85 minutes.

## 2022-07-17 NOTE — Unmapped (Signed)
Called to schedule Psych Eval & RD F/U after , left msg

## 2022-07-19 MED FILL — METFORMIN ER 500 MG TABLET,EXTENDED RELEASE 24 HR: ORAL | 60 days supply | Qty: 120 | Fill #2

## 2022-07-25 DIAGNOSIS — K469 Unspecified abdominal hernia without obstruction or gangrene: Principal | ICD-10-CM

## 2022-07-25 DIAGNOSIS — G4733 Obstructive sleep apnea (adult) (pediatric): Principal | ICD-10-CM

## 2022-07-25 DIAGNOSIS — I10 Essential (primary) hypertension: Principal | ICD-10-CM

## 2022-07-25 DIAGNOSIS — Z6841 Body Mass Index (BMI) 40.0 and over, adult: Principal | ICD-10-CM

## 2022-07-25 NOTE — Unmapped (Signed)
EGD order placed.     Derris Millan L. Stina Gane MSN, FNP APRN-BC  [facility]  GI Surgery  Bariatric Surgery  07/25/2022  12:24 PM

## 2022-07-25 NOTE — Unmapped (Signed)
Pt called about EDG gave her GI procedures number

## 2022-07-25 NOTE — Unmapped (Signed)
EGD  Procedure #1   0  Procedure #2   [id number]  MRN   GENERIC   Endoscopist   FALSE  Is the patient's health insurance [employer], [place] [facility]), or [employer]?   FALSE  Urgent procedure   FALSE  Do you take: Plavix (clopidogrel), Coumadin (warfarin), Lovenox (enoxaparin), Pradaxa (dabigatran), Effient (prasugrel), Xarelto (rivaroxaban), Eliquis (apixaban), Pletal (cilostazol), or Brilinta (ticagrelor)?   FALSE  Do you have hemophilia, von Willebrand disease, thrombocytopenia?   FALSE  Do you have a pacemaker or implanted cardiac defibrillator?   FALSE  Are you pregnant?   FALSE  Has a [facility] provider specified the location(s)?     Which location(s) did the [facility] provider specify?   FALSE     [facility]   FALSE     [place]   FALSE     [facility]-Propofol   FALSE     HMOB-Mod Sedation   FALSE  Is procedure indication for variceal banding (this does NOT include variceal screening)?        5  Height (feet)   7  Height (inches)   318  Weight (pounds)   49.8  BMI        FALSE  Did the ordering provider specify a bowel prep?   NO        What bowel prep was specified?   FALSE  Do you have chronic kidney disease?   FALSE  Do you have chronic constipation or have you had poor quality bowel preps for past colonoscopies?   FALSE  Do you have Crohn's disease or ulcerative colitis?   FALSE  Have you had weight loss surgery?        FALSE  Are you in the process of scheduling or awaiting results of a heart ultrasound, stress test, or catheterization to evaluate new or worsening chest pain, dizziness, or shortness of breath?   FALSE  When you walk around your house or grocery store, do you have to stop and rest due to shortness of breath, chest pain, or light-headedness?   FALSE  Have you had a heart attack, stroke or heart stent placement within the past 6 months?   FALSE  Do you ever use supplemental oxygen?   FALSE  Have you been hospitalized for cirrhosis of the liver or heart failure in the last 12 months?   FALSE  Have you been treated for mouth or throat cancer with radiation or surgery?   FALSE  Have you been told that it is difficult for doctors to insert a breathing tube in you during anesthesia?   FALSE  Have you had a heart or lung transplant?        FALSE  Are you on dialysis?   FALSE  Do you have cirrhosis of the liver?   FALSE  Do you have myasthenia gravis?   FALSE  Is the patient a prisoner?        FALSE  Have you been diagnosed with sleep apnea or do you wear a CPAP machine at night?   FALSE  Are you younger than 30?   FALSE  Have you previously received propofol sedation administered by an anesthesiologist for a GI procedure?   FALSE  Do you drink an average of more than 3 drinks of alcohol per day?   FALSE  Do you regularly take suboxone or any prescription medications for chronic pain?   FALSE  Do you regularly take Ativan, Klonopin, Xanax, Valium, lorazepam,  clonazepam, alprazolam, or diazepam?   FALSE  Have you previously had difficulty with sedation during a GI procedure?   FALSE  Have you been diagnosed with PTSD?   FALSE  Are you allergic to fentanyl or midazolam (Versed)?   FALSE  Do you take medications for HIV?   ################### ###################################################################################################################   MRN:          [id number]   Anticoag Review:  No   Nurse Triage:  No   GI Clinic Consult:  No   Procedure(s):  EGD     0   [facility]     [facility]              Endoscopist:  GENERIC    Urgent:            No   Prep:                       ################### ###################################################################################################################

## 2022-08-02 NOTE — Unmapped (Signed)
Dermatology Note     Assessment and Plan:      Erythematous slightly indurated plaque on left bicep extending slightly into the axillae, ddx includes cellulitis vs. Inflammatory reaction to recent Nexplanon. Less likely hidradenitis suppurativa flare given erythema and induration  - Start doxycycline (VIBRA-TABS) 100 MG tablet; Take 1 tablet (100 mg total) by mouth Two (2) times a day for 14 days. Take 1 tablet (s) by mouth 2 time(s) a day. Take medication with or after a meal and do not lie down 30 minutes after taking medication to decrease risk of abdominal discomfort and heartburn/esophageal discomfort. Stop if pregnant or breastfeeding.  - Sent message to PCP Dr. Leonor Liv and Leda Quail with [facility] family medicine who put in the Nexplanon for further evaluation of site.   - Told patient if she started experiencing systemic symptoms such as fever, chills, to go to ED for further evaluation as she may need IV antibiotics.     The patient was advised to call for an appointment should any new, changing, or symptomatic lesions develop.     RTC: Return if symptoms worsen or fail to improve. or sooner as needed   _________________________________________________________________      Chief Complaint     Redness and swelling of left arm and axillae    HPI     Wendy Chen is a 41 y.o. female who presents as a returning patient (last seen by Dr. Caralyn Guile and Dr. Orma Flaming on 12/05/2021) to Dermatology for follow up of HS. At last visit, patient underwent punch I&D to the left axilla and was to continue humira 80 mg weekly, start infliximab infusions 10 mg/kg, and start a prednisone taper (60 mg x4 days, 50 mg x4 days, 40 mg x4 days, 30 mg x4 days, 20 mg x4 days, 10 mg x4 days).     For this visit, she started having left upper arm redness, pain, and slight swelling. It will also extend slightly into the left axillae. She has had a draining lesion in the left axillae as well. She had a nexplanon inserted on 8/11 in the left arm and about a week afterwards started having onset of symptoms. Has not been on any antibiotics.     Denies fever, chills, flu-like symptoms since onset of symptoms.     The patient denies any other new or changing lesions or areas of concern.     Pertinent Past Medical History     No history of skin cancer     Disease course:  Year when symptoms first noticed: 83  Year of diagnosis: 2019  Who diagnosed you? Dermatologist  Location of first symptoms: groin, axillae and inner thighs  Typical involved areas include: groin, axillae, inner thighs and buttocks  Typical number of inflammatory lesions each month at baseline (from first visit): 1-3  Disease triggers: stress, sweat and exercise     Are menstrual cycles irregular when not on birth control? Yes.  Current form of contraception: implant  Effect of hormonal contraception on disease: no change  Difficulty becoming pregnancy? No.  Pregnancy complications? early delivery, pre-eclampsia and c-section  How many children? 3  Better or worse with pregnancy?  no change.  If so, worse during a specific trimester? No change     Family History:   Reviewed in Epic      Past Medical History, Family History, Social History, Medication List, Allergies, and Problem List were reviewed in the rooming section of Epic.     ROS: Other  than symptoms mentioned in the HPI, no fevers, chills, or other skin complaints    Physical Examination     GENERAL: Well-appearing female in no acute distress, resting comfortably.  NEURO: Alert and oriented, answers questions appropriately  PSYCH: Normal mood and affect    SKIN (Focal Skin Exam): Per patient request, examination of bilateral upper extremities and axillae was performed  Erythematous, well-demarcated plaque over left bicep region, tender to palpation, slightly warm and indurated. No fluctuance noted. One draining inflammatory nodule in left axillae with hypertrophic scarring in bilateral axillae.                     All areas not commented on are within normal limits or unremarkable        (Approved Template 08/09/2020)

## 2022-08-02 NOTE — Unmapped (Signed)
Attempted to call patient regarding upcoming GI procedure on 08/04/22. No answer. LVM for patient to call nurse line with any questions.

## 2022-08-03 ENCOUNTER — Ambulatory Visit
Admit: 2022-08-03 | Discharge: 2022-08-04 | Payer: PRIVATE HEALTH INSURANCE | Attending: Student in an Organized Health Care Education/Training Program | Primary: Student in an Organized Health Care Education/Training Program

## 2022-08-03 DIAGNOSIS — L0390 Cellulitis, unspecified: Principal | ICD-10-CM

## 2022-08-03 MED ORDER — DOXYCYCLINE HYCLATE 100 MG TABLET
ORAL_TABLET | Freq: Two times a day (BID) | ORAL | 0 refills | 14.00000 days | Status: CP
Start: 2022-08-03 — End: 2022-08-17

## 2022-08-03 NOTE — Unmapped (Signed)
[facility] releases most results to you as soon as they are available. Therefore, you may see some results before we do. Please give us 3 business days to review the tests and contact you by phone or through MyChart. If you are concerned that some results may be upsetting or confusing, you may wish to wait until we contact you before looking at the report in MyChart.   If you have an urgent question, you can send us a message or call our clinic. Otherwise, we prefer that you wait 3 business days for us to contact you.    [facility] Clinical Team

## 2022-08-04 ENCOUNTER — Encounter: Admit: 2022-08-04 | Discharge: 2022-08-04 | Payer: PRIVATE HEALTH INSURANCE

## 2022-08-04 ENCOUNTER — Ambulatory Visit: Admit: 2022-08-04 | Discharge: 2022-08-04 | Payer: PRIVATE HEALTH INSURANCE

## 2022-08-04 MED ADMIN — lidocaine (XYLOCAINE) 20 mg/mL (2 %) injection: INTRAVENOUS | @ 14:00:00 | Stop: 2022-08-04

## 2022-08-04 MED ADMIN — propofoL (DIPRIVAN) injection: INTRAVENOUS | @ 14:00:00 | Stop: 2022-08-04

## 2022-08-04 MED ADMIN — lactated Ringers infusion: 10 mL/h | INTRAVENOUS | @ 13:00:00 | Stop: 2022-08-04

## 2022-08-04 MED ADMIN — dexmedetomidine 400 mcg in sodium chloride 0.9% 100 ml (4 mcg/mL) infusion PMB: INTRAVENOUS | @ 14:00:00 | Stop: 2022-08-04

## 2022-08-05 NOTE — Unmapped (Signed)
Upcoming Appt:  Future Appointments   Date Time Provider Department Center   08/07/2022 11:15 AM Guadalupe Maple, PhD [facility]   08/21/2022  2:30 PM Philomena Course, RD/LDN [facility]   08/22/2022 11:00 AM Charmayne Sheer, RD/LDN [facility]   10/24/2022  9:40 AM Maralyn Sago Elon Jester, MD [id number]       Recent:   What is the date of your last related visit? 08/03/22 [facility] visit for cellulitis of upper arm area s/p nexplanon implantation   Related acute medications Rx'd:  doxycycline oral  Home treatment tried:  none      Relevant:   Allergies: Patient has no known allergies.  Medications: doxycycline hyclate (cellulitis, started 08/03/22)   Health History: cellulitis of upper arm  Weight: not asked       [place] [facility] patients only:  What was the date of your last cancer treatment (mm/dd/yy)?: n/a  Was the treatment oral or infusion?: n/a  Are you currently on TVEC (yes/no)?: n/a    Reason for Disposition   [1] Taking antibiotic > 24 hours AND [2] cellulitis symptoms are WORSE (e.g., spreading redness, pain, swelling)    Answer Assessment - Initial Assessment Questions  1. SYMPTOM: What's the main symptom you're concerned about? (e.g., redness, swelling, pain, fever, weakness)      Opened up, can see white meat- size of dime  2. CELLULITIS LOCATION: Where is the cellulitis located? (e.g., hand, arm, foot, leg, face)      Left upper arm are at crease   3. CELLULITIS SIZE: What is the size of the red area? (e.g., inches, centimeters; compare to size of a coin) .      Covers whole upper arm area, hand size,  4. BETTER-SAME-WORSE: Are you getting better, staying the same, or getting worse compared to the day you started the antibiotics?       Warm to touch, worse, now area is open  5. PAIN: Do you have any pain?  If Yes, ask: How bad is the pain?  (e.g., Scale 1-10; mild, moderate, or severe)     - MILD (1-3): Doesn't interfere with normal activities.      - MODERATE (4-7): Interferes with normal activities or awakens from sleep.     - SEVERE (8-10): Excruciating pain, unable to do any normal activities.        Moderate, 6/10, depends on movement  6. FEVER: Do you have a fever? If Yes, ask: What is it, how was it measured and when did it start?      Denies   7. OTHER SYMPTOMS: Do you have any other symptoms? (e.g., pus coming from a wound, red streaks, weakness)      Nothing draining, no bleeding, no red streaks  8. DIAGNOSIS DATE: When was the cellulitis diagnosed? By whom?       08/03/22 at [facility] dermatologist  9. ANTIBIOTIC NAME: What antibiotic(s) are you taking?  How many times per day? (Be sure the patient is receiving the antibiotic as directed).       Doxycyline   10. ANTIBIOTIC DATE: When was the antibiotic started?        08/03/22  11. FOLLOW-UP APPOINTMENT: Do you have follow-up appointment with your doctor?        No scheduled follow up    Protocols used: Cellulitis on Antibiotic Follow-up Call-A-AH

## 2022-08-05 NOTE — Unmapped (Signed)
Regarding: Poss. Allergic reaction to mediation.  ----- Message from Gevena Cotton sent at 08/04/2022  6:23 PM EDT -----  If you had not called [facility], what do you think you would have done?   Go to Emergency Dept

## 2022-08-07 ENCOUNTER — Institutional Professional Consult (permissible substitution): Admit: 2022-08-07 | Payer: PRIVATE HEALTH INSURANCE | Attending: Clinical | Primary: Clinical
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
## 2022-08-09 NOTE — Unmapped (Signed)
I reviewed this patient case and all documentation provided by the learner and was readily available for consultation during their interaction with the patient.  I agree with the assessment and plan listed below. (Allergies not marked as reviewed)    Wendy Chen Shared [facility] Specialty Pharmacist      [facility] Clinical Assessment & Refill Coordination Note    Wendy Chen, [place]: March 22, 1981  Phone: [phone] (home)     All above HIPAA information was verified with patient.     Was a Nurse, learning disability used for this call? No    Specialty Medication(s):   Inflammatory Disorders: Humira     Current Outpatient Medications   Medication Sig Dispense Refill    chlorthalidone (HYGROTON) 50 MG tablet Take 1 tablet (50 mg total) by mouth daily. 90 tablet 3    doxycycline (VIBRA-TABS) 100 MG tablet Take 1 tablet (100 mg total) by mouth Two (2) times a day for 14 days. Take 1 tablet (s) by mouth 2 time(s) a day. Take medication with or after a meal and do not lie down 30 minutes after taking medication to decrease risk of abdominal discomfort and heartburn/esophageal discomfort. Stop if pregnant or breastfeeding. 28 tablet 0    empty container Misc Use as directed to dispose of Humira pens. 1 each 2    HUMIRA PEN CITRATE FREE 40 MG/0.4 ML Inject the contents of 1 pen (40 mg total) under the skin every seven (7) days. 12 each 3    metFORMIN (GLUCOPHAGE-XR) 500 MG 24 hr tablet Take 1 tablet (500 mg total) by mouth Two (2) times a day. 120 tablet 3     No current facility-administered medications for this visit.        Changes to medications: [name] reports no changes at this time.    No Known Allergies    Changes to allergies: No    SPECIALTY MEDICATION ADHERENCE     Humira 40 mg: 3 days of medicine on hand        Medication Adherence    Patient reported X missed doses in the last month: 0  Specialty Medication: Humira  Patient is on additional specialty medications: No                            Specialty medication(s) dose(s) confirmed: Regimen is correct and unchanged.     Are there any concerns with adherence? No    Adherence counseling provided? Not needed    CLINICAL MANAGEMENT AND INTERVENTION      Clinical Benefit Assessment:    Do you feel the medicine is effective or helping your condition? Yes    Clinical Benefit counseling provided? Not needed    Adverse Effects Assessment:    Are you experiencing any side effects? No    Are you experiencing difficulty administering your medicine? No    Quality of Life Assessment:    Quality of Life    Rheumatology  Oncology  Dermatology  1. What impact has your specialty medication had on the symptoms of your skin condition (i.e. itchiness, soreness, stinging)?: Some  2. What impact has your specialty medication had on your comfort level with your skin?: Minimal  Cystic Fibrosis            Acute Infection Status:    Acute infections noted within Epic:  No active infections  Patient reported infection: None  Therapy Appropriateness:    Is therapy appropriate and patient progressing towards therapeutic goals? Yes, therapy is appropriate and should be continued    DISEASE/MEDICATION-SPECIFIC INFORMATION      For patients on injectable medications: Patient currently has 1 doses left.  Next injection is scheduled for 9/15.    PATIENT SPECIFIC NEEDS     Does the patient have any physical, cognitive, or cultural barriers? No    Is the patient high risk? No    Does the patient require a Care Management Plan? No     SOCIAL DETERMINANTS OF HEALTH     At the [facility], we have learned that life circumstances - like trouble affording food, housing, utilities, or transportation can affect the health of many of our patients.   That is why we wanted to ask: are you currently experiencing any life circumstances that are negatively impacting your health and/or quality of life? Patient declined to answer    Social Determinants of Health     Financial Resource Strain: Low Risk  (07/07/2022)    Overall Financial Resource Strain (CARDIA)     Difficulty of Paying Living Expenses: Not very hard   Internet Connectivity: Not on file   Food Insecurity: No Food Insecurity (07/07/2022)    Hunger Vital Sign     Worried About Running Out of Food in the Last Year: Never true     Ran Out of Food in the Last Year: Never true   Tobacco Use: Low Risk  (08/05/2022)    Patient History     Smoking Tobacco Use: Never     Smokeless Tobacco Use: Never     Passive Exposure: Never   Housing/Utilities: Low Risk  (07/07/2022)    Housing/Utilities     Within the past 12 months, have you ever stayed: outside, in a car, in a tent, in an overnight shelter, or temporarily in someone else's home (i.e. couch-surfing)?: No     Are you worried about losing your housing?: No     Within the past 12 months, have you been unable to get utilities (heat, electricity) when it was really needed?: No   Alcohol Use: Not on file   Transportation Needs: No Transportation Needs (07/07/2022)    PRAPARE - Therapist, art (Medical): No     Lack of Transportation (Non-Medical): No   Substance Use: Not on file   Health Literacy: Not on file   Physical Activity: Not on file   Interpersonal Safety: Not on file   Stress: Not on file   Intimate Partner Violence: Not on file   Depression: Not at risk (12/01/2020)    PHQ-2     PHQ-2 Score: 0   Social Connections: Not on file       Would you be willing to receive help with any of the needs that you have identified today? Not applicable       SHIPPING     Specialty Medication(s) to be Shipped:   Inflammatory Disorders: Humira    Other medication(s) to be shipped:  Chlorthalidone     Changes to insurance: No    Delivery Scheduled: Yes, Expected medication delivery date: 9/20.     Medication will be delivered via Same Day Courier to the confirmed prescription address in [place].    The patient will receive a drug information handout for each medication shipped and additional FDA Medication Guides as required.  Verified that patient has previously received a Welcome  Packet and a Surveyor, mining.    The patient or caregiver noted above participated in the development of this care plan and knows that they can request review of or adjustments to the care plan at any time.      All of the patient's questions and concerns have been addressed.    Gaye Alken, PharmD Candidate  [facility]

## 2022-08-16 MED FILL — CHLORTHALIDONE 50 MG TABLET: ORAL | 90 days supply | Qty: 90 | Fill #2

## 2022-08-16 MED FILL — HUMIRA PEN CITRATE FREE 40 MG/0.4 ML: SUBCUTANEOUS | 28 days supply | Qty: 4 | Fill #1

## 2022-08-21 ENCOUNTER — Telehealth
Admit: 2022-08-21 | Discharge: 2022-08-22 | Payer: PRIVATE HEALTH INSURANCE | Attending: Registered" | Primary: Registered"

## 2022-08-21 NOTE — Unmapped (Cosign Needed)
[facility] Outpatient Nutrition Services   Pre-Op Bariatric Medical Nutrition Therapy Consultation       Visit Type: Return Bariatric Nutrition Assessment    Referral Reason: obesity      Wendy Chen is a 41 y.o. female seen for medical nutrition therapy for weight loss surgery. Her medication list, labs, and typical intake were reviewed.     Her interim medical history is significant for HTN, Sleep apnea, and Obesity.    Anthropometrics   Height: 170.2 cm (5' 7.01)   Weight: (!) 144.2 kg (318 lb)  Body mass index is 49.79 kg/m??.    Wt Readings from Last 12 Encounters:   08/21/22 (!) 144.2 kg (318 lb)   08/04/22 (!) 144.2 kg (318 lb)   07/13/22 (!) 144.2 kg (318 lb)   07/12/22 (!) 142.9 kg (315 lb 0.6 oz)   07/07/22 (!) 142.9 kg (315 lb)   07/04/22 (!) 144.2 kg (318 lb)   06/27/22 (!) 144.2 kg (318 lb)   06/20/22 (!) 146.1 kg (322 lb 0.6 oz)   06/05/22 (!) 147.9 kg (326 lb 1.6 oz)   06/06/22 (!) 147.9 kg (326 lb 1 oz)   04/21/22 (!) 148.4 kg (327 lb 1.6 oz)   04/19/22 (!) 148.8 kg (328 lb 1.3 oz)        Ideal Body Weight: 61.29 kg  Weight in (lb) to have BMI = 25: 159.3  Goal weight (per patient): BMI: 30   Weight Change: +1.3kg since last RD visit    Nutrition Risk Screening:   Food Insecurity: No Food Insecurity (07/07/2022)    Hunger Vital Sign     Worried About Running Out of Food in the Last Year: Never true     Ran Out of Food in the Last Year: Never true       Nutrition Focused Physical Exam:  Nutrition Evaluation  Overall Impressions: Nutrition-Focused Physical Exam not indicated due to lack of malnutrition risk factors. (08/21/22 1706)  Nutrition Designation: Obese class III  (BMI > 39.99 kg/m2) (08/21/22 1706)    Malnutrition Screening:   Patient does not meet AND/ASPEN criteria for malnutrition at this time (08/21/22 1706)    Biochemical Data, Medical Tests and Procedures:  No recent pertinent labs or other nutritionally relevant data available for review.    Lab Results   Component Value Date    HGB 13.8 06/05/2022    HCT 41.4 06/05/2022    MCV 83.6 06/05/2022    IRON 82 06/05/2022    TIBC 378.0 06/05/2022    VITAMINB12 791 06/05/2022    FOLATE 21.4 06/05/2022    VITDTOTAL 23.9 06/05/2022     Lab Results   Component Value Date    ALKPHOS 65 06/05/2022    ALT 18 06/05/2022    AST 17 06/05/2022    BUN 11 06/05/2022    CREATININE 0.78 06/05/2022    GFRNONAA >=60 10/25/2017    GFRAA >=60 10/25/2017     Lab Results   Component Value Date    CHOL 206 (H) 06/05/2022    HDL 52 06/05/2022    LDL 118 (H) 06/05/2022    NONHDL 154 (H) 06/05/2022    TRIG 179 (H) 06/05/2022    A1C 4.9 06/05/2022    TSH 1.107 06/05/2022       New developments since last nutrition appointment:  Patient Updates: Patient denies any major updates since last visit. Patient does note that she has been working out at home bc she has not  been able to make it to the gym.     Patient has been working on the following Bariatric Dietary Behaviors:  Not drinking with meals and waiting for 30 minutes after - progressing  Focusing on protein rich foods at meals/snacks - accomplished  Mindful eating - accomplished  Eliminating carbonated/sugar sweetened beverages - accomplished  Choosing a protein shake and a bariatric vitamins - accomplished   Protein shake: Premier                             Bariatric Multivitamin: Bariatric Fusion  7.   Maintaining a regular exercise regimen - progressing   Type: Patient just started back to work and is out of routine.   Duration: 8 minute - plank video   Frequency: 5x/wk   Physical Activity:  Physical activity level is light with some exercise.     Medications and Vitamin/Mineral Supplementation:   All nutritionally pertinent medications reviewed on 08/21/2022.   Nutritionally pertinent medications include: metformin, Humira     She is taking nutrition supplements. multivitamin    Current Outpatient Medications   Medication Sig Dispense Refill    chlorthalidone (HYGROTON) 50 MG tablet Take 1 tablet (50 mg total) by mouth daily. 90 tablet 3    empty container Misc Use as directed to dispose of Humira pens. 1 each 2    HUMIRA PEN CITRATE FREE 40 MG/0.4 ML Inject the contents of 1 pen (40 mg total) under the skin every seven (7) days. 12 each 3    metFORMIN (GLUCOPHAGE-XR) 500 MG 24 hr tablet Take 1 tablet (500 mg total) by mouth Two (2) times a day. 120 tablet 3     No current facility-administered medications for this visit.       Nutrition History:     Dietary Restrictions: No known food allergies or food intolerances.     Gastrointestinal Issues: Denied issues    Hunger and Satiety: Denied issues.     24-Hour Recall/Usual Intake:  Up: 5:00am  B 8:30am: 1/2 cup scrambled eggs + 1/2 cup potatoes + 1 sausage or overnight oats + chia seeds + berries/nuts  S   L 11:35am: salad bar at work (vegetables + beans/chicken/cheese/eggs)  S pickles  D 8:00pm: chicken (legs) + vegetables  HS pickle    Food-Related History:  Beverages: over 64oz water, Crystal Light  Dining Out: lettuce wrap burger  Tracking protein? Just trying to get it in at meals.  Meal Schedule: meals: 3, snacks: 2     Eating Behaviors:  Excessive Hunger: Denies  Emotional Eating: Denied issues.   Grazing: Denied issues.   Cravings: pickles   Nighttime Eating: None.  Fast Eating:  Trying to slow down.   Overeating: Denied issues with overeating.     Nutritional Needs:  Energy: 2135 kcals [Per [place] St-Jeor Equation (AF: 1.0 for weight loss) using last recorded weight, 144.2 kg (08/21/22 1705)]  Protein: 72-87 gm [1.0-1.2 gm/kg using adjusted body weight (for a BMI: 25), 72.4 kg (08/21/22 1705)]  Carbohydrate:   [< / equal to 45% of kcal]  Fluid: 2000-3000 [ ]       Nutrition Goals & Evaluation      Demonstrate understanding of bariatric nutrition  (Ongoing and Progressing)  Demonstrate behavioral change  (Ongoing and Progressing)  Meet nutritional needs (Met and Ongoing)  Compliance with nutrition goals (Ongoing and Progressing)  (0.5-1.0)#/wk weight loss since last RD visit (Not Met and Ongoing)  Nutrition goals reviewed, and relevant barriers identified and addressed: knowledge deficit . She is evaluated to have good willingness and ability to achieve nutrition goals.     Nutrition Assessment       The patient is currently likely meeting estimated nutrition needs related to overall good compliance with previously set nutrition goals. Per anthropometric data, BMI: 49.79 classifies patient as obese (class 3). EMR indicates a 1.3kg weight gain since last RD visit. There are no new labs to assess. She has been successful with previous nutrition recommendations. She is eating 3 meals and 2 snacks per day. Patient is focused on protein at meals with carbohydrates coming mostly from non-starchy vegetables and fruits. Beverages are sugar free and non-carbonated. She is practicing not drinking with meals, however, this remains a challenge. She is participating in limited activity outside of daily routine with plans on going back to the gym once she's back in her work routine. She has selected an appropriate bariatric multivitamin. Patient expressed a verbal understanding with prompting of the pre op diet, post op diet progression, protein/fluid goals, and exercise/vitamin recommendations.        Bariatric Surgery Assessment: Patient has had many previous unsuccessful attempts at weight loss. Patient has expressed a verbal understanding of the post bariatric surgery diet and the recommendations including: meal/snack timing, protein intake, not eating and drinking at meals and waiting for 30 minutes after meals to start drinking, vitamin supplementation, and regular exercise. Patient is aware that failure to follow the post operative plan may have negative consequences such as failure to lose weight/weight gain and vitamin deficiencies. Patient was amenable to suggestions and has expressed a willingness/ability to follow recommendations in order to have a positive outcome from bariatric surgery. At this time, RD believes patient is a good candidate for surgery.         Nutrition Intervention      - Nutrition Education: bariatric surgery diet/eating style. Education resources provided include: after visit summary with patient instructions   Nutrition Tracking/Logging Tool: She was provided registration information for the [employer] nutrition management mobile application at initial visit.    Nutrition Plan:   Please see patient instructions.    Follow up will occur 6-8 weeks post op.      Food/Nutrition-related history, Anthropometric measurements, Biochemical data, medical tests, procedures, Patient understanding or compliance with intervention and recommendations , and Effectiveness of nutrition interventions will be assessed at time of follow-up.       Recommendations for Clinical Team:  Please encourage a consistent meal/snack schedule with a focus on protein. Exercise outside of daily routine should also be encouraged for 150-300 minutes per week.       The patient reports they are currently: not at home. I spent 24 minutes on the real-time audio and video visit with the patient on the date of service. I spent an additional 15 minutes on pre- and post-visit activities on the date of service.     The patient was not located and I was located within 250 yards of a hospital based location during the real-time audio and video visit. The patient was physically located in [place] or a state in which I am permitted to provide care. The patient and/or parent/guardian understood that s/he may incur co-pays and cost sharing, and agreed to the telemedicine visit. The visit was reasonable and appropriate under the circumstances given the patient's presentation at the time.    The patient and/or parent/guardian has been advised of the potential  risks and limitations of this mode of treatment (including, but not limited to, the absence of in-person examination) and has agreed to be treated using telemedicine. The patient's/patient's family's questions regarding telemedicine have been answered.    If the visit was completed in an ambulatory setting, the patient and/or parent/guardian has also been advised to contact their provider???s office for worsening conditions, and seek emergency medical treatment and/or call 911 if the patient deems either necessary.      I am located on-site and the patient is located off-site for this visit.

## 2022-08-22 NOTE — Unmapped (Signed)
GOALS:    Continue with a consistent meal/snack schedule   - protein every 3-4 hours    Continue with current exercise - get back to the gym when able   - increase duration/frequency/intensity as tolerated   - include variety in routine with a mix of cardio + strength/resistance training    Continue to review bariatric surgery nutrition information weekly prior to surgery

## 2022-09-01 ENCOUNTER — Ambulatory Visit: Admit: 2022-09-01 | Discharge: 2022-09-02 | Payer: PRIVATE HEALTH INSURANCE | Attending: Family | Primary: Family

## 2022-09-01 NOTE — Unmapped (Signed)
PATIENT INSTRUCTIONS    You have now completed the Bariatric Surgery Program work up.    You will now be scheduled to meet with the surgeon    Our bariatric nurse coordinator, Eldridge Dace, will call you for an appointment or notify you once approved. She can be reached at [phone]. [email or web address]    The visit with the surgeon will be at  [facility] at [address] [place] on a Monday or Friday.    On your pre-op appointment with the surgeon, the type of surgery will be confirmed, a consent will be signed, and a surgery date will be given.     If you are currently taking antiobesity medications such as phentermine, semaglutide, Wegovy, or Ozempic : These must be stopped 14 days prior to surgery      PREOP  See Binder for all details  Start Bariatric surgery LIVER SHRINK diet 14 days prior to surgery   Take your bariatric vitamins   Take docusate sodium (Colace) 100 mg twice a day to prevent constipation    POSTOP  See Binder for all details  Day 1 to Day 10: follow a full Liquid diet/Protein shakes, water, broth- for 10 full days   Miralax 17g 1 scoop up to twice a day to help alleviate constipation. Put the powder Miralax in Crystal Light or your protein shake  Purchase chewable Gas X to have on hand for Gas Pain   Take your Bariatric Chewable vitamins and Calcium after you drink 3 oz of protein shake  Day 11 BEGIN Pureed Foods   You can continue or stop the Miralax as needed  Day 21 BEGIN Soft Solid Foods  Around 4-6 weeks after surgery you can start solid foods      Be sure to join our group on Facebook- [facility] Support Group    Support Groups:     Www.zoom.com   ID: [id number]  Password: [email or web address]  1st  Monday of each month at 6 pm   3rd Thursday of each month at 7 pm    check website for more information   [email or web address]      Important websites: www.bariatriceating.com, or http://www.harvey.com/, www.[email or web address], www.[email or web address], www.[email or web address]

## 2022-09-01 NOTE — Unmapped (Addendum)
Bariatric Surgery Pre-operative Outpatient Note    PRIMARY CARE PROVIDER: Lilian Kapur, MD     REFERRING PROVIDER: Lilian Kapur, MD  [address]  [place],  [place] [address]    Chief Complaint   Patient presents with    Follow-up       HISTORY OF PRESENT ILLNESS: Wendy Chen is 41 y.o. old female who was initially seen in the Bariatric Intake Clinic on 06/05/2022 who is seen today for further discussion regarding her candidacy and readiness for Metabolic Bariatric Surgery.   Since last visit she has not had changes in her health history.   She feels she has made significant dietary changes in preparation for surgery.   She has read the [facility] Bariatric Surgery Education Binder.   Weight change since last appointment: -15 lbs.  Wt Readings from Last 6 Encounters:   09/01/22 (!) 141.2 kg (311 lb 3.2 oz)   08/21/22 (!) 144.2 kg (318 lb)   08/04/22 (!) 144.2 kg (318 lb)   07/13/22 (!) 144.2 kg (318 lb)   07/12/22 (!) 142.9 kg (315 lb 0.6 oz)   07/07/22 (!) 142.9 kg (315 lb)       Past Medical History:   Diagnosis Date    AMA age 24 10/04/2016    We discussed the patient's age related risk of aneuploidy.  We discussed screening options including serum screening and NIPT as well as diagnostic options of CVS and amniocentesis.  We discussed risks, benefits and diagnostic capability of both CVS and amniocentesis.   -ordered genetic consult and first trimester screen  -Age 37 at [facility] -S/p genetic counseling 11/17 -Negative cfDNA -Fetal fraction 1.5% (Fetal fraction is affected by fetal aneuploidy (trisomy 13 and 18), maternal BMI, and other maternal conditions such as preexisting hypertension. In the setting of low fetal fraction (typically defined in the literature as <4%) sensitivity of cfDNA screening may be reduced Janyth Contes, 2015. Reprod Sci).      Disorder of skin or subcutaneous tissue     Heart murmur     benign, no abx prophylaxis required, 2012 echo noted trivial TR    Hidradenitis suppurativa 2018 Hypertension     Migraine     Obesity     Postpartum depression 2007, 2012    Procreative genetic counseling 10/05/2016    Genetic counseling visit on 10/09/16 Aneuploidy screening/ testing: cfDNA; results negative - low fetal fraction Carrier screening: [x]  Cystic fibrosis - neg x 106 mutationss [x]  Spinal muscular atrophy - 3 copies; reduced carrier risk [x]  Hemoglobinopathy screening - normal adult hemoglobin present      Sleep apnea     Snoring 12/08/2016     Current Outpatient Medications on File Prior to Visit   Medication Sig    chlorthalidone (HYGROTON) 50 MG tablet Take 1 tablet (50 mg total) by mouth daily.    HUMIRA PEN CITRATE FREE 40 MG/0.4 ML Inject the contents of 1 pen (40 mg total) under the skin every seven (7) days.    metFORMIN (GLUCOPHAGE-XR) 500 MG 24 hr tablet Take 1 tablet (500 mg total) by mouth Two (2) times a day.    empty container Misc Use as directed to dispose of Humira pens.     No current facility-administered medications on file prior to visit.     No Known Allergies  Past Surgical History:   Procedure Laterality Date    CESAREAN SECTION  2007, 2012    PR CESAREAN DELIVERY ONLY N/A 04/03/2017  Procedure: CESAREAN DELIVERY ONLY;  Surgeon: Barb Merino, MD;  Location: L&D C-SECTION OR SUITES [facility];  Service: Family Planning    PR REVISE MEDIAN N/CARPAL TUNNEL SURG Right 03/02/2021    Procedure: NEUROPLASTY AND/OR TRANSPOSITION; MEDIAN NERVE AT CARPAL TUNNEL;  Surgeon: Daisy Lazar, MD;  Location: ASC OR [facility];  Service: Orthopedics    PR REVISE MEDIAN N/CARPAL TUNNEL SURG Left 03/16/2021    Procedure: NEUROPLASTY AND/OR TRANSPOSITION; MEDIAN NERVE AT CARPAL TUNNEL;  Surgeon: Daisy Lazar, MD;  Location: ASC OR [facility];  Service: Orthopedics    PR UPPER GI ENDOSCOPY,BIOPSY N/A 08/04/2022    Procedure: UGI ENDOSCOPY; WITH BIOPSY, SINGLE OR MULTIPLE;  Surgeon: Jules Husbands, MD;  Location: [facility];  Service: Gastroenterology    WISDOM TOOTH EXTRACTION       family history includes Alzheimer's disease in her paternal uncle; Cancer in her maternal uncle; Colon cancer in her paternal uncle; Diabetes in her father, maternal grandfather, maternal grandmother, and mother; Heart attack in her brother; Heart murmur in her maternal grandfather and maternal grandmother; Hypertension in her brother, father, mother, and sister; Seizures in her brother; Stroke in her brother.   Social History     Tobacco Use    Smoking status: Never     Passive exposure: Never    Smokeless tobacco: Never   Substance Use Topics    Alcohol use: No     Alcohol/week: 0.0 standard drinks of alcohol       Review of Systems: Ten system review is negative except as per HPI.    Pre-op LABS were drawn on 06/05/2022    Findings:   Lab Results   Component Value Date    WBC 6.7 06/05/2022    HGB 13.8 06/05/2022    HCT 41.4 06/05/2022    PLT 329 06/05/2022    CHOL 206 (H) 06/05/2022    TRIG 179 (H) 06/05/2022    HDL 52 06/05/2022    ALT 18 06/05/2022    AST 17 06/05/2022    NA 139 06/05/2022    K 3.4 06/05/2022    CL 102 06/05/2022    CREATININE 0.78 06/05/2022    BUN 11 06/05/2022    CO2 28.0 06/05/2022    TSH 1.107 06/05/2022    GLUF 87 02/22/2017     Lab Results   Component Value Date    VITAMINB12 791 06/05/2022   ,   Lab Results   Component Value Date    IRON 82 06/05/2022    TIBC 378.0 06/05/2022   ,   Lab Results   Component Value Date    A1C 4.9 06/05/2022       Concerning lab results: elevated cholesterol  The 10-year ASCVD risk score (Arnett DK, et al., 2019) is: 2.8%    Values used to calculate the score:      Age: 36 years      Sex: Female      Is Non-Hispanic African American: Yes      Diabetic: No      Tobacco smoker: No      Systolic Blood Pressure: 136 mmHg      Is BP treated: Yes      HDL Cholesterol: 52 mg/dL      Total Cholesterol: 206 mg/dL    Note: For patients with SBP <90 or >200, Total Cholesterol <130 or >320, HDL <20 or >100 which are outside of the allowable range, the calculator will use these upper  or lower values to calculate the patient???s risk score.    Cardio-Pulmonary Concerns:  She has a hx of HTN.    She was referred to a Cardiologist.   Echo: 07/12/2022   The left ventricle is normal in size with mildly increased wall thickness.    The left ventricular systolic function is normal, LVEF is visually estimated  at 55-60%.    There is normal left ventricular diastolic function.  She denies orthopnea, PND, palpitations, syncope/presyncope.  BP Readings from Last 3 Encounters:   09/01/22 136/70   08/04/22 114/88   07/13/22 134/77     Cardiac clearance / letter of Support:  on file    GI SYMPTOMS:  Since last appointment has had no c/o GER, dysphagia. Reflux: no.  Bowels are regular.  Taking PPI:   none  EGD:              - Normal esophagus.                         - Normal stomach. Biopsied.                         - Normal examined duodenum.   Biopsy/H-Pylori: - Oxyntic and antral-type mucosa with mild superficial chronic inactive gastritis  - No Helicobacter organisms identified by H&E or immunostain  - No atrophy, intestinal metaplasia, or dysplasia identified    EATING HABITS:  Met with dietitian: 08/21/2022.   See note for details on eating habits and adherence to plan.  Feels that she has a good understanding of the Bariatric Nutrition Goals (per [facility] Bariatric Surgery Binder).   Has eliminated SSB (sugar sweetened beverages).  She is comfortable with and able to describe the pre, and post, operative diet.   She has chosen a bariatric vitamin in preparation for surgery.   Brand: Fusion- qid. Approved by RD: YES    Understands to start the liver shrink diet 2 weeks prior to surgery, and to begin the recommended vitamins, with a stool softener bid.  Will begin taking Bariatric chewable multivitamins once home from the hospital and 17 g Miralax daily, as tolerated, prn constipation.  RD clearance received yes      PHYSICAL ACTIVITY:  Describes current activity level as starting to increase  She is going to the gym less due to increased work hours.   No physical limitations.     Exercise tolerance: Is not experiencing exertional chest pain or SOB.     SLEEP:  Sleep: is good, usually restful. Gets about 6 hours per night.  Polysomnogram done on: 02/24/2017 at [facility].  Sleep Apnea: YES    AHI : 28.8. REM AHI is  64.6, REM supine AHI is 60 , and REM lateral AHI is  66.3.   Cpap use/tolerance: Yes. Compliance: 100%    PSYCHOSOCIAL:  Mood: depression, not currently on Celexa, none in 5 years. She has been feeling well, without periods of depression or anxiety.  Feels she has adequate support post surgery, and someone to assist afterwards: yes, parents, sister   Joined Facebook Support Page: Valentino Hue.    Attends Zoom based Support group: yes    Meds: none   Attending psychotherapy: none    Alcohol: understands the need to abstain alcohol for a minimum of 6 months post surgery, discussed risk of transference. She does not drink.     Smoking: .   Confirmation Nicotine level done on  06/05/2022 was negative.   Psychological evaluation/clearance received on: 08/07/2022 by Dr. Vertis Kelch    BIRTH CONTROL:  Nexplanon    Surgical Understanding:  Surgery:  Sleeve Gastrectomy.  Level of understanding:  high.   Reviewed the need for a financial commitment to vitamin supplementation and protein shake supplements.  no needs or concerns verbalized.  Benefits of surgery reviewed: weight loss, improvement in co-morbid conditions, less pain/pressure on weight bearing joints, improved mobility, better exercise tolerance  Risks of surgery reviewed including but not limited to: procedure is IRREVERSIBLE, there may be frequent food intolerances initially, as well as a risk of ulcers, bleeding/clotting disorders, infection, stenosis, pain, leak, hernia, reflux, too little/too much weight loss, weight regain, vitamin deficiencies, hair loss, skin sagginess    PHYSICAL EXAMINATION:   BP 136/70  - Pulse 56  - Temp 36.7 ??C (98.1 ??F)  - Resp 20  - Ht 170.2 cm (5' 7)  - Wt (!) 141.2 kg (311 lb 3.2 oz)  - LMP 07/31/2022 (Approximate)  - SpO2 100%  - BMI 48.74 kg/m??    Waist: 46 inches Hips: 56.5 inches  Waist hip ratio 0.80   Obese   Alert, pleasant  Resp:  regular, easy  Gait:  steady, without assistive devices  Edema: None    IMPRESSION  40 yr old female with WHO Class III Obesity  Encounter Diagnoses   Name Primary?    Essential hypertension Yes    BMI 48.7, adult (CMS-HCC)     Obstructive sleep apnea     Encounter for discussion of proposed gastric sleeve surgery         PLAN  Verdia has been deemed to be an acceptable candidate for weight loss surgery and is considered medically necessary based on : BMI 48.74.  ~She has hypertension and elevated cholesterol: Current 10 yr ASCVD risk is 2.8%  low (would not likely benefit from statin therapy). Lifestyle modification advised.  Continue chlorthalidone 50 mg daily.  ~Sleep apnea is treated with CPAP nightly.  ~She has a ventral hernia and is unable to be repaired until her BMI is less than 35 her weight is less than 225 pounds.  ~Hidradenitis suppurativa : stable on current treatment Humira weekly    She has completed, and was compliant with, the necessary preoperative workup.  She has documentation that supports a history of obesity for at least 2 year(s).   She feels motivated and prepared to move forward with bariatric/metabolic surgery, namely Sleeve Gastrectomy as a means to improve health.    We reviewed her medical history, IBW, eating habits, exercise habits, sleep habits, and reviewed her progress. Labwork: 06/05/2022    She will continue to exercise outside of her daily routine.  This can include walking, chair exercises, cardio, weights, bands, and resistance training, HIIT as tolerated, with a goal of 150 minutes per week.     She will follow the RD recommendations, including the 2 week pre-operative modified liquid diet, see her note for full details dated 08/21/2022.    Preop Bariatric Surgery evaluation/workup completed:   Sleeve Gastrectomy.  The individual's psychiatric profile is such that the candidate is able to understand, tolerate and comply with all phases of care and is committed to long-term follow-up requirements  The candidate's post-operative expectations have been addressed as well as realistic goal weight  The individual has undergone a preoperative medical consultation and is felt to be an acceptable surgical candidate  The individual has undergone a preoperative mental health assessment  and is felt to be an acceptable candidate  The individual has received a thorough explanation of the risks, benefits, and uncertainties of the procedure  The candidate's treatment plan included pre- and post-operative dietary evaluations and nutritional counseling  The candidate's treatment plan included counseling regarding exercise, psychological issues and the availability of supportive resources when needed.  Reviewed costs associated with Bariatric surgery- necessary lifelong vitamins, supplements, co-pays, follow up care    Preoperative requirements completed:      Completed       Supervised Diet Started 06/05/2022   EGD 08-04-2022   Nutrition Class Completed   RD clearance 08/21/2022   Psych evaluation 08/07/2022   Mental Health Letter- psych (if needed)    Labs 06/05/2022   Urine tox/nicotine Negative   Cardiac clearance 07/13/2022   Echo done    PSG done on 02/24/2017  AHI: 28.8  Cpap compliance: 100%  COUNSELED ON increased risk of readmission, pulmonary complications, death in untreated sleep apnea       Yes       Gallbladder Intact   Gerd No nondiabetic    Diabetes No   Fertility status Has Nexplanon   Significant sweet tooth craver No     Patient Preference: Vertical Sleeve Gastrectomy     1. Continue to attend bimonthly Zoom support groups and online social media page   2. Avoidance of nicotine, alcohol, blood thinners, or NSAIDS long term   3. Start a stool softener 100 mg bid 2 weeks pre-op, and take MIralax post op prn   4. Begin chewable multivitamin 2 weeks before surgery and continue taking upon discharge home from hospital    She understands the need to see PCP for all routine health care needs outside of Bariatric Surgery/Obesity Management.     Return to Clinic: Pre-op with surgeon    Then post operative visits at  2 weeks, 6 weeks, 3 months, 6 months, 1 year, 18 months, and yearly with the Medical Weight Loss team for a minimum of 5 years  Treana Lacour L. Hanny Elsberry MSN, FNP APRN-BC  [facility]  GI Surgery  Bariatric Surgery  09/01/2022        Supervising MD:  Encarnacion Chu MD  Has reviewed and agreed with documentation, findings, and plan of care      Counseling  Length of visit: 35 mins

## 2022-09-04 NOTE — Unmapped (Signed)
[facility] Refill Coordination Note    Specialty Medication(s) to be Shipped:   Inflammatory Disorders: Humira    Other medication(s) to be shipped:  metformin     Wendy Chen, DOB: October 31, 1981  Phone: [phone] (home)       All above HIPAA information was verified with patient.     Was a Nurse, learning disability used for this call? No    Completed refill call assessment today to schedule patient's medication shipment from the [facility] [phone]).  All relevant notes have been reviewed.     Specialty medication(s) and dose(s) confirmed: Regimen is correct and unchanged.   Changes to medications: Wendy Chen reports no changes at this time.  Changes to insurance: No  New side effects reported not previously addressed with a pharmacist or physician: None reported  Questions for the pharmacist: No    Confirmed patient received a Conservation officer, historic buildings and a Surveyor, mining with first shipment. The patient will receive a drug information handout for each medication shipped and additional FDA Medication Guides as required.       DISEASE/MEDICATION-SPECIFIC INFORMATION        For patients on injectable medications: Patient currently has 1 doses left.  Next injection is scheduled for 10/13.    SPECIALTY MEDICATION ADHERENCE     Medication Adherence    Patient reported X missed doses in the last month: 0  Specialty Medication: Humira  Patient is on additional specialty medications: No  Any gaps in refill history greater than 2 weeks in the last 3 months: no  Demonstrates understanding of importance of adherence: yes  Informant: patient  Reliability of informant: reliable              Confirmed plan for next specialty medication refill: delivery by pharmacy  Refills needed for supportive medications: not needed              Were doses missed due to medication being on hold? No    Humira 40/0.4 mg/ml: 7 days of medicine on hand        REFERRAL TO PHARMACIST     Referral to the pharmacist: Not needed      [facility]     Shipping address confirmed in Epic.     Delivery Scheduled: Yes, Expected medication delivery date: 10/13.     Medication will be delivered via Same Day Courier to the prescription address in Epic WAM.    Valere Dross   [facility] Specialty Technician

## 2022-09-06 NOTE — Unmapped (Signed)
-----   Message from Cyd Silence, RN sent at 09/06/2022 12:15 PM EDT -----  Molli Knock  I will get her scheduled.    Misty Stanley  ----- Message -----  From: Zettie Cooley, FNP  Sent: 09/06/2022  11:44 AM EDT  To: Cyd Silence, RN; Minda Meo, RN    She wanted Yetta Flock    ----- Message -----  From: Minda Meo, RN  Sent: 09/06/2022  11:24 AM EDT  To: Cyd Silence, RN; Zettie Cooley, FNP    [facility].  Thanks!  ----- Message -----  From: Cyd Silence, RN  Sent: 09/06/2022  10:37 AM EDT  To: Zettie Cooley, FNP; #    I believe this lady is ready to be scheduled for her last visit with Dr. Augustine Radar for submission to [facility].      Misty Stanley

## 2022-09-07 NOTE — Unmapped (Signed)
TC placed in attempt to schedule appointment with Dr. Yetta Flock.  VM left requesting return call.

## 2022-09-08 MED FILL — METFORMIN ER 500 MG TABLET,EXTENDED RELEASE 24 HR: ORAL | 60 days supply | Qty: 120 | Fill #3

## 2022-09-08 MED FILL — HUMIRA PEN CITRATE FREE 40 MG/0.4 ML: SUBCUTANEOUS | 28 days supply | Qty: 4 | Fill #2

## 2022-09-13 NOTE — Unmapped (Signed)
Call placed in attempt to schedule appointment with Dr. Yetta Flock prior to submission to insurance for approval.  VM left requesting return call.

## 2022-09-29 ENCOUNTER — Ambulatory Visit
Admit: 2022-09-29 | Discharge: 2022-09-30 | Payer: PRIVATE HEALTH INSURANCE | Attending: Student in an Organized Health Care Education/Training Program | Primary: Student in an Organized Health Care Education/Training Program

## 2022-09-29 DIAGNOSIS — G4733 Obstructive sleep apnea (adult) (pediatric): Principal | ICD-10-CM

## 2022-09-29 DIAGNOSIS — Z6841 Body Mass Index (BMI) 40.0 and over, adult: Principal | ICD-10-CM

## 2022-09-29 NOTE — Unmapped (Addendum)
Your surgery has been scheduled on 10-23-2022.  You will receive a call the day before your surgery between the hours of 2 and 6 to advise you of your time to arrive to the [facility].     The morning of your surgery please check in at Admissions and Registration located on the ground floor of the [facility].     Please stop your Humira today.  You will be able to restart your Humira 2 weeks after surgery.    Please begin  the liver reduction diet 14 days prior to your surgery date    Please bring your C-Pap Machine with you to the hospital the day of surgery.     On the morning of your surgery, only take the medicines that the Pre-Arrival associate told you were OK to take.  Take them with a sip of water.    One (1) hour prior to your scheduled arrive time, drink the Ensure-Clear Pre Surgery Drink.      The night before your surgery and the morning of please take a shower with any anti-bacterial soap.       If you have any questions regarding your visit today please feel free to contact me at the number listed below.     Eldridge Dace, RN, Bariatric Coordinator  Surgical RN Coordinator for Dr. Rowe Clack  [email or web address]  Phone:  [phone]  Fax:       [phone]

## 2022-09-29 NOTE — Unmapped (Signed)
BARIATRIC SURGERY OUTPATIENT NOTE    PRIMARY CARE PROVIDER: Lilian Kapur, MD     REFERRING PROVIDER: Fabio Neighbors, MD  [address]  PHS [address] [facility]  [place],  [place] [address]    HISTORY    HISTORY OF PRESENT ILLNESS: Wendy Chen is 41 y.o. old female who was initially seen in the Bariatric Intake Clinic by Ayesha Rumpf and colleagues and who returns today for further discussions regarding her candidacy for laparoscopic bariatric surgery. Delice Bison has nicely detailed the patient's nonsurgical weight loss efforts as well as her medical comorbidities as noted below. She is interested in a laparoscopic sleeve gastrectomy.    Past Medical History:   Diagnosis Date   ??? AMA age 45 10/04/2016    We discussed the patient's age related risk of aneuploidy.  We discussed screening options including serum screening and NIPT as well as diagnostic options of CVS and amniocentesis.  We discussed risks, benefits and diagnostic capability of both CVS and amniocentesis.   -ordered genetic consult and first trimester screen  -Age 6 at [facility] -S/p genetic counseling 11/17 -Negative cfDNA -Fetal fraction 1.5% (Fetal fraction is affected by fetal aneuploidy (trisomy 13 and 18), maternal BMI, and other maternal conditions such as preexisting hypertension. In the setting of low fetal fraction (typically defined in the literature as <4%) sensitivity of cfDNA screening may be reduced Janyth Contes, 2015. Reprod Sci).     ??? Disorder of skin or subcutaneous tissue    ??? Heart murmur     benign, no abx prophylaxis required, 2012 echo noted trivial TR   ??? Hidradenitis suppurativa 2018   ??? Hypertension    ??? Migraine    ??? Obesity    ??? Postpartum depression 2007, 2012   ??? Procreative genetic counseling 10/05/2016    Genetic counseling visit on 10/09/16 Aneuploidy screening/ testing: cfDNA; results negative - low fetal fraction Carrier screening: [x]  Cystic fibrosis - neg x 106 mutationss [x]  Spinal muscular atrophy - 3 copies; reduced carrier risk [x]  Hemoglobinopathy screening - normal adult hemoglobin present     ??? Sleep apnea    ??? Snoring 12/08/2016       Current Outpatient Medications on File Prior to Visit   Medication Sig   ??? chlorthalidone (HYGROTON) 50 MG tablet Take 1 tablet (50 mg total) by mouth daily.   ??? empty container Misc Use as directed to dispose of Humira pens.   ??? HUMIRA PEN CITRATE FREE 40 MG/0.4 ML Inject the contents of 1 pen (40 mg total) under the skin every seven (7) days.   ??? metFORMIN (GLUCOPHAGE-XR) 500 MG 24 hr tablet Take 1 tablet (500 mg total) by mouth Two (2) times a day.     No current facility-administered medications on file prior to visit.       No Known Allergies    Past Surgical History:   Procedure Laterality Date   ??? CESAREAN SECTION  2007, 2012   ??? PR CESAREAN DELIVERY ONLY N/A 04/03/2017    Procedure: CESAREAN DELIVERY ONLY;  Surgeon: Barb Merino, MD;  Location: L&D C-SECTION OR SUITES [facility];  Service: Family Planning   ??? PR REVISE MEDIAN N/CARPAL TUNNEL SURG Right 03/02/2021    Procedure: NEUROPLASTY AND/OR TRANSPOSITION; MEDIAN NERVE AT CARPAL TUNNEL;  Surgeon: Daisy Lazar, MD;  Location: ASC OR [facility];  Service: Orthopedics   ??? PR REVISE MEDIAN N/CARPAL TUNNEL SURG Left 03/16/2021    Procedure: NEUROPLASTY AND/OR TRANSPOSITION;  MEDIAN NERVE AT CARPAL TUNNEL;  Surgeon: Theodora Blow Jacqlyn Krauss, MD;  Location: ASC OR [facility];  Service: Orthopedics   ??? PR UPPER GI ENDOSCOPY,BIOPSY N/A 08/04/2022    Procedure: UGI ENDOSCOPY; WITH BIOPSY, SINGLE OR MULTIPLE;  Surgeon: Jules Husbands, MD;  Location: GI PROCEDURES MEMOR[facility];  Service: Gastroenterology   ??? WISDOM TOOTH EXTRACTION         Family History: Her family history includes Alzheimer's disease in her paternal uncle; Cancer in her maternal uncle; Colon cancer in her paternal uncle; Diabetes in her father, maternal grandfather, maternal grandmother, and mother; Heart attack in her brother; Heart murmur in her maternal grandfather and maternal grandmother; Hypertension in her brother, father, mother, and sister; Seizures in her brother; Stroke in her brother. No coagulation or anesthesia-related complications.     Social History: She reports that she has never smoked. She has never been exposed to tobacco smoke. She has never used smokeless tobacco. She reports that she does not drink alcohol and does not use drugs.    Review of Systems: Ten system review is negative except as per HPI and PMH.    PHYSICAL EXAMINATION:     BP 150/97  - Pulse 57  - Temp 35.7 ??C (96.2 ??F) (Temporal)  - Ht 170.2 cm (5' 7)  - Wt (!) 139.5 kg (307 lb 8 oz)  - LMP 07/31/2022 (Approximate)  - BMI 48.16 kg/m??     HEENT: Normal.    NECK: No adenopathy.  LUNGS: Clear.   Heart: RRR.   ABDOMEN: Obese, soft, non-tender, lower abdominal ventral hernia without overlyng skin changes or tenderness to palpation. No reducible.  EXTREMITIES: Benign.   NEUROLOGIC: Grossly nonfocal.     IMPRESSION    Morbid obesity with comorbidities noted above.     PLAN    We counseled the patient about the indication, risks and benefits of laparoscopic sleeve gastrectomy. She understands the operation entails creation of a long lesser curvature based stomach with resection of the gastric fundus reducing the gastric capacity by 80%, and that the lifestyle changes related to this anatomy include need for frequent small-volume meals, and lifelong vitamin and micronutrient supplementation and surveillance.      We counseled her that there is approximately a 0.5% mortality and 10-15% morbidity associated with this operation, and we discussed the categories of risk including gastrointestinal leak, thromboembolism, bleeding, infection, injury to normal structures, conversion to open surgery, inadequate weight loss, regain of weight, evolution of GERD, gastric outlet obstruction, strictures, bowel obstruction, abdominal wall hernias, need for secondary interventions, potential for hair loss, loose skin, evolution of biliary stones, cardiac and pulmonary complications including arrhythmia, myocardial infarction, pneumonia and other risks not specifically detailed here.     The risk of venous thromboembolism was calculated as 0.11% using a validated risk calculator ([employer]). The patient is not likely to benefit from extended thromboprophylaxis.     We reviewed the expected hospital course and recovery after discharge, the stages of diet and activity, and the need for long-term vitamin supplementation and surveillance. We reviewed the vital importance of developing patterns of regular exercise to achieve an optimal and durable outcome.     We also reminded the patient about the benefits of keeping her appointments over at least five years of follow-up, and of having a supportive structure including use of support groups. In addition, she was reminded to avoid aspirin for one week prior to the intended procedure. Consent was signed in clinic today. All questions  were answered.       Hildred Laser, MD  PGY-1, General Surgery    Attestation    I saw and evaluated the patient, participating in the key portions of the service.  I reviewed the resident???s note.  I agree with the resident???s findings and plan.     Wendy Chen is a very pleasant 41 y.o. female with PMH of OSA (on CPAP), HTN, hidradenitis (on Humira), and small bowel containing ventral incisional hernia, who has done very well within our weight loss program thus far.  She is highly motivated and interested in pursuing weight loss surgery, and meets criteria for benefiting from bariatric surgery given her BMI of Body mass index is 48.16 kg/m??.  in clinic today.   We reviewed her PMH and PSH, as well as the options available to her (laparoscopic vs robotic sleeve gastrectomy or laparoscopic vs robotic gastric bypass).  She has known OSA and is compliant with CPAP therapy.  She had an EGD 08/04/22 that was without evidence of hiatal hernia, esophagitis, Barrett's esophagus, or H pylori on biopsy.    After engaging in shared decision making process, discussing the procedures, risks, benefits, and alternatives available to her, Wendy Chen would like to proceed with laparoscopic vs robotic sleeve gastrectomy.  The planned procedure, risks (pain, bleeding, infection, staple line leak, stricture, incisional hernia, impaired wound healing due to her Humira use damage to surrounding structures, need for further procedures, risk of new or worsening GERD, need for lifelong vitamin supplementation, risk of weight regain, VTE, PT, MI, and death, among others), benefits, and alternatives were reviewed with Wendy Chen.   Her questions were answered and consent was obtained.  I look forward to seeing her for surgery 11/27.  She is due for her Humira 11/4, and I have asked that she hold the next 2 doses of Humira, with plans to restart her HUmira ~ 2 weeks after surgery.  She is in the middle of a flare of hidradenitis, but reports the Humira does not seem to help these, so she is amenable to holding her Humira.  If her flare worsens, I have encouraged her out to [place] and her Dermatologist to discuss further.    Arman Filter, MD, MPH  October 03, 2022 10:58 AM

## 2022-10-03 NOTE — Unmapped (Signed)
[facility] Refill Coordination Note    Specialty Medication(s) to be Shipped:   Inflammatory Disorders: Humira    Other medication(s) to be shipped: No additional medications requested for fill at this time     Wendy Chen, DOB: August 27, 1981  Phone: [phone] (home)       All above HIPAA information was verified with patient.     Was a Nurse, learning disability used for this call? No    Completed refill call assessment today to schedule patient's medication shipment from the [facility] [phone]).  All relevant notes have been reviewed.     Specialty medication(s) and dose(s) confirmed: Regimen is correct and unchanged.   Changes to medications: Damiya reports no changes at this time.  Changes to insurance: No  New side effects reported not previously addressed with a pharmacist or physician: None reported  Questions for the pharmacist: No    Confirmed patient received a Conservation officer, historic buildings and a Surveyor, mining with first shipment. The patient will receive a drug information handout for each medication shipped and additional FDA Medication Guides as required.       DISEASE/MEDICATION-SPECIFIC INFORMATION        For patients on injectable medications: Patient currently has 2 doses left.  Next injection is scheduled for 11/10.    SPECIALTY MEDICATION ADHERENCE     Medication Adherence    Patient reported X missed doses in the last month: 0  Specialty Medication: Humira  Patient is on additional specialty medications: No  Any gaps in refill history greater than 2 weeks in the last 3 months: no  Demonstrates understanding of importance of adherence: yes  Informant: patient  Reliability of informant: reliable              Confirmed plan for next specialty medication refill: delivery by pharmacy  Refills needed for supportive medications: not needed              Were doses missed due to medication being on hold? Yes - 1 due to patient havinga procedure done    Humira 40/0.4 mg/ml: 14 days of medicine on hand        REFERRAL TO PHARMACIST     Referral to the pharmacist: Not needed      [facility]     Shipping address confirmed in Epic.     Delivery Scheduled: Yes, Expected medication delivery date: 11/10.     Medication will be delivered via Same Day Courier to the prescription address in Epic WAM.    Valere Dross   [facility] Specialty Technician

## 2022-10-04 NOTE — Unmapped (Signed)
Case submitted to insurance requesting authorization of vertical sleeve gastrectomy.  Dr. Rowe Clack  - surgeon

## 2022-10-06 MED FILL — HUMIRA PEN CITRATE FREE 40 MG/0.4 ML: SUBCUTANEOUS | 28 days supply | Qty: 4 | Fill #3

## 2022-10-16 NOTE — Unmapped (Signed)
TC placed to patient to advise due to schedule surgery is now scheduled 10/26/2022.  Wendy Chen is amenable to her new surgery date.  Patient will continue her liver reduction diet.

## 2022-10-26 ENCOUNTER — Encounter
Admit: 2022-10-26 | Discharge: 2022-10-27 | Disposition: A | Discharge status: Home / Self Care | Payer: PRIVATE HEALTH INSURANCE | Admitting: Student in an Organized Health Care Education/Training Program

## 2022-10-26 ENCOUNTER — Ambulatory Visit
Admit: 2022-10-26 | Discharge: 2022-10-27 | Disposition: A | Discharge status: Home / Self Care | Payer: PRIVATE HEALTH INSURANCE | Admitting: Student in an Organized Health Care Education/Training Program

## 2022-10-26 ENCOUNTER — Encounter
Admit: 2022-10-26 | Discharge: 2022-10-27 | Disposition: A | Discharge status: Home / Self Care | Payer: PRIVATE HEALTH INSURANCE | Attending: Student in an Organized Health Care Education/Training Program | Admitting: Student in an Organized Health Care Education/Training Program

## 2022-10-26 LAB — PROTIME-INR
INR: 1.12
PROTIME: 12.5 s (ref 9.9–12.6)

## 2022-10-26 LAB — BASIC METABOLIC PANEL
ANION GAP: 9 mmol/L (ref 5–14)
BLOOD UREA NITROGEN: 15 mg/dL (ref 9–23)
BUN / CREAT RATIO: 21
CALCIUM: 9.6 mg/dL (ref 8.7–10.4)
CHLORIDE: 102 mmol/L (ref 98–107)
CO2: 27 mmol/L (ref 20.0–31.0)
CREATININE: 0.71 mg/dL
EGFR CKD-EPI (2021) FEMALE: >90 mL/min/{1.73_m2} (ref >=60)
GLUCOSE RANDOM: 107 mg/dL — ABNORMAL HIGH (ref 70–99)
POTASSIUM: 2.9 mmol/L — ABNORMAL LOW (ref 3.4–4.8)
SODIUM: 138 mmol/L (ref 135–145)

## 2022-10-26 LAB — CBC
HEMATOCRIT: 36.5 % (ref 34.0–44.0)
HEMOGLOBIN: 12.6 g/dL (ref 11.3–14.9)
MEAN CORPUSCULAR HEMOGLOBIN CONC: 34.4 g/dL (ref 32.0–36.0)
MEAN CORPUSCULAR HEMOGLOBIN: 28.7 pg (ref 25.9–32.4)
MEAN CORPUSCULAR VOLUME: 83.5 fL (ref 77.6–95.7)
MEAN PLATELET VOLUME: 7.5 fL (ref 6.8–10.7)
PLATELET COUNT: 359 10*9/L (ref 150–450)
RED BLOOD CELL COUNT: 4.37 10*12/L (ref 3.95–5.13)
RED CELL DISTRIBUTION WIDTH: 13.3 % (ref 12.2–15.2)
WBC ADJUSTED: 6.7 10*9/L (ref 3.6–11.2)

## 2022-10-26 LAB — APTT
APTT: 30 s (ref 24.8–38.4)
HEPARIN CORRELATION: 0.2

## 2022-10-26 MED ADMIN — lidocaine (XYLOCAINE) 20 mg/mL (2 %) injection: INTRAVENOUS | @ 13:00:00 | Stop: 2022-10-26

## 2022-10-26 MED ADMIN — oxyCODONE (ROXICODONE) 5 mg/5 mL solution 10 mg: 10 mg | ORAL | @ 23:00:00 | Stop: 2022-11-09

## 2022-10-26 MED ADMIN — pregabalin (LYRICA) capsule 100 mg: 100 mg | ORAL | @ 12:00:00 | Stop: 2022-10-26

## 2022-10-26 MED ADMIN — phenylephrine 0.8 mg/10 mL (80 mcg/mL) injection: INTRAVENOUS | @ 13:00:00 | Stop: 2022-10-26

## 2022-10-26 MED ADMIN — lactated Ringers infusion: 150 mL/h | INTRAVENOUS | @ 15:00:00

## 2022-10-26 MED ADMIN — ondansetron (ZOFRAN) injection: INTRAVENOUS | @ 15:00:00 | Stop: 2022-10-26

## 2022-10-26 MED ADMIN — HYDROmorphone (PF) (DILAUDID) injection: INTRAVENOUS | @ 15:00:00 | Stop: 2022-10-26

## 2022-10-26 MED ADMIN — sugammadex (BRIDION) injection: INTRAVENOUS | @ 15:00:00 | Stop: 2022-10-26

## 2022-10-26 MED ADMIN — midazolam (VERSED) injection: INTRAVENOUS | @ 12:00:00 | Stop: 2022-10-26

## 2022-10-26 MED ADMIN — dexAMETHasone (DECADRON) 4 mg/mL injection: INTRAVENOUS | @ 13:00:00 | Stop: 2022-10-26

## 2022-10-26 MED ADMIN — lactated ringers bolus 1,000 mL: 1000 mL | INTRAVENOUS | @ 12:00:00 | Stop: 2022-10-26

## 2022-10-26 MED ADMIN — methocarbamoL (ROBAXIN) 1,000 mg in sodium chloride (NS) 0.9 % 50 mL IVPB: 1000 mg | INTRAVENOUS | @ 17:00:00

## 2022-10-26 MED ADMIN — propofol (DIPRIVAN) infusion 10 mg/mL: INTRAVENOUS | @ 13:00:00 | Stop: 2022-10-26

## 2022-10-26 MED ADMIN — fentaNYL (PF) (SUBLIMAZE) injection: INTRAVENOUS | @ 13:00:00 | Stop: 2022-10-26

## 2022-10-26 MED ADMIN — Propofol (DIPRIVAN) injection: INTRAVENOUS | @ 14:00:00 | Stop: 2022-10-26

## 2022-10-26 MED ADMIN — sodium chloride irrigation (NS) 0.9 % irrigation solution: @ 13:00:00 | Stop: 2022-10-26

## 2022-10-26 MED ADMIN — lactated Ringers infusion: 10 mL/h | INTRAVENOUS | @ 15:00:00

## 2022-10-26 MED ADMIN — bupivacaine-EPINEPHrine (PF) (MARCAINE-PF w/EPI) 0.25 %-1:200,000 injection (PF): @ 14:00:00 | Stop: 2022-10-26

## 2022-10-26 MED ADMIN — succinylcholine (ANECTINE) injection: INTRAVENOUS | @ 13:00:00 | Stop: 2022-10-26

## 2022-10-26 MED ADMIN — dexmedeTOMIDine (Precedex) injection: INTRAVENOUS | @ 15:00:00 | Stop: 2022-10-26

## 2022-10-26 MED ADMIN — acetaminophen (OFIRMEV) 10 mg/mL injection 1,000 mg: 1000 mg | INTRAVENOUS | @ 23:00:00 | Stop: 2022-10-27

## 2022-10-26 MED ADMIN — lactated Ringers infusion: 10 mL/h | INTRAVENOUS | @ 12:00:00

## 2022-10-26 MED ADMIN — haloperidol lactate (HALDOL) injection 0.5 mg: .5 mg | INTRAVENOUS | @ 19:00:00 | Stop: 2022-10-26

## 2022-10-26 MED ADMIN — ceFAZolin (ANCEF) 3 g in sodium chloride (NS) 0.9 % 100 mL IVPB: 3 g | INTRAVENOUS | @ 13:00:00 | Stop: 2022-10-26

## 2022-10-26 MED ADMIN — HYDROmorphone (PF) injection Syrg 0.5 mg: .5 mg | INTRAVENOUS | @ 16:00:00 | Stop: 2022-10-26

## 2022-10-26 MED ADMIN — acetaminophen (OFIRMEV) 10 mg/mL injection 1,000 mg: 1000 mg | INTRAVENOUS | @ 17:00:00 | Stop: 2022-10-27

## 2022-10-26 MED ADMIN — magnesium sulfate in water 2 gram/50 mL (4 %) IVPB: INTRAVENOUS | @ 13:00:00 | Stop: 2022-10-26

## 2022-10-26 MED ADMIN — Propofol (DIPRIVAN) injection: INTRAVENOUS | @ 13:00:00 | Stop: 2022-10-26

## 2022-10-26 MED ADMIN — scopolamine (TRANSDERM-SCOP) 1 mg over 3 days topical patch 1 mg: 1 | TOPICAL | @ 12:00:00 | Stop: 2022-10-26

## 2022-10-26 MED ADMIN — HYDROmorphone (PF) injection Syrg 0.5 mg: .5 mg | INTRAVENOUS | @ 17:00:00 | Stop: 2022-10-26

## 2022-10-26 MED ADMIN — ROCuronium (ZEMURON) injection: INTRAVENOUS | @ 13:00:00 | Stop: 2022-10-26

## 2022-10-26 MED ADMIN — heparin (porcine) 5,000 unit/mL injection 5,000 Units: 5000 [IU] | SUBCUTANEOUS | @ 12:00:00 | Stop: 2022-10-26

## 2022-10-26 MED ADMIN — celecoxib (CeleBREX) capsule 200 mg: 200 mg | ORAL | @ 12:00:00 | Stop: 2022-10-26

## 2022-10-26 MED ADMIN — ROCuronium (ZEMURON) injection: INTRAVENOUS | @ 14:00:00 | Stop: 2022-10-26

## 2022-10-26 MED ADMIN — lactated Ringers infusion: 10 mL/h | INTRAVENOUS | @ 13:00:00

## 2022-10-26 MED ADMIN — acetaminophen (TYLENOL) oral liquid: 1000 mg | ORAL | @ 12:00:00 | Stop: 2022-10-26

## 2022-10-26 MED ADMIN — ondansetron (ZOFRAN) injection 4 mg: 4 mg | INTRAVENOUS | @ 23:00:00

## 2022-10-26 MED ADMIN — ketamine (KETALAR) injection: INTRAVENOUS | @ 13:00:00 | Stop: 2022-10-26

## 2022-10-26 MED ADMIN — ceFAZolin (ANCEF) 3 g in sodium chloride (NS) 0.9 % 100 mL IVPB: 3 g | INTRAVENOUS | @ 23:00:00 | Stop: 2022-10-27

## 2022-10-26 NOTE — Unmapped (Signed)
Brief Operative Note  (CSN: [id number])      Date of Surgery: 10/26/2022    Pre-op Diagnosis: Morbid obesity    Post-op Diagnosis: Sleep apnea, obstructive [G47.33]  Morbid obesity with BMI of 45.0-49.9, adult (CMS-HCC) [E66.01, Z68.42]    Procedure(s):  LAPAROSCOPY, SURGICAL, GASTRIC RESTRICTIVE PROCEDURE; LONGITUDINAL GASTRECTOMY: 43775 (CPT??)  Note: Revisions to procedures should be made in chart - see Procedures activity.    Performing Service: Gastrointestinal  Surgeon(s) and Role:     Yetta Flock, Estrella Deeds, MD - Primary     * Vickey Sages Dierdre Harness, MD - Resident - Assisting    Assistant: None    Findings: Laparoscopic sleeve gastrectomy, normal gastric anatomy    Anesthesia: General    Estimated Blood Loss: 10 mL    Complications: None    Specimens:   ID Type Source Tests Collected by Time Destination   1 : lateral stomach Tissue Stomach SURGICAL PATHOLOGY Georgie Chard, MD 10/26/2022 [phone]        Implants: * No implants in log *    Surgeon Notes: Dr. Yetta Flock was present and scrubbed for the entire case    Emilio Aspen, MD   Date: 10/26/2022  Time: 9:51 AM

## 2022-10-26 NOTE — Unmapped (Signed)
Bariatric Surgery Operative Report    Patient Name: Wendy Chen    Medical Record Number: [id number]  Date of Birth: 04-Mar-1981  Date of Admission: 10/26/2022    Date of Service: 10/26/2022    PREOPERATIVE DIAGNOSIS:    Morbid obesity unresponsive to medical management    POSTOPERATIVE DIAGNOSIS:   Same     PROCEDURE:   LAPAROSCOPY, SURGICAL, GASTRIC RESTRICTIVE PROCEDURE; LONGITUDINAL GASTRECTOMY  Bilateral - TRANSVERSUS ABDOMINIS PLANE (TAP) BLOCK (ABDOMINAL PLANE BLOCK, RECTUS SHEATH BLOCK) BILATERAL; BY INJECTIONS (INCLUDES IMAGING GUIDANCE, WHEN PERFORMED)      SURGEON: Arman Filter, MD, MPH    Assistant: Aundra Dubin, MD; resident, assisting    ANESTHESIOLOGIST: Anesthesiologist: Secundino Ginger, MD  Anesthesia Technician: Irine Seal, Lonny Prude  Anesthesia Provider: Eulogio Ditch, CRNA  Anesthesiologist Assistant Student: Vivi Martens    ANESTHESIA: General endotracheal anesthesia.     ESTIMATED BLOOD LOSS: Less than 10 mL     INDICATIONS: Wendy Chen is a very pleasant 41 y.o. female with PMH of OSA (on CPAP), HTN, hidradenitis (on Humira), and small bowel containing ventral incisional hernia, who has done very well within our weight loss program thus far.  She is highly motivated and interested in pursuing weight loss surgery, and meets criteria for benefiting from bariatric surgery given her BMI of Body mass index is 48.16 kg/m??.  in clinic today.   We reviewed her PMH and PSH, as well as the options available to her (laparoscopic vs robotic sleeve gastrectomy or laparoscopic vs robotic gastric bypass).  She has known OSA and is compliant with CPAP therapy.  She had an EGD 08/04/22 that was without evidence of hiatal hernia, esophagitis, Barrett's esophagus, or H pylori on biopsy.    After engaging in shared decision making process, discussing the procedures, risks, benefits, and alternatives available to her, Ms. Carland would like to proceed with laparoscopic vs robotic sleeve gastrectomy.  The planned procedure, risks (pain, bleeding, infection, staple line leak, stricture, incisional hernia, impaired wound healing due to her Humira use damage to surrounding structures, need for further procedures, risk of new or worsening GERD, need for lifelong vitamin supplementation, risk of weight regain, VTE, PT, MI, and death, among others), benefits, and alternatives were reviewed with Ms. Creswell.   Her questions were answered and consent was obtained.     SUMMARY OF FINDINGS:     Normal gastric anatomy  No hiatal hernia    COMPLICATIONS: none apparent at completion of the procedure    Specimens:   ID Type Source Tests Collected by Time Destination   1 : lateral stomach Tissue Stomach SURGICAL PATHOLOGY Georgie Chard, MD 10/26/2022 [phone]          DESCRIPTION OF PROCEDURE: The patient was taken to the operating room  where they were placed in the supine position. A preoperative time out was performed, confirming the patients identity, the planned procedure, anticipated intraoperative and post-operative needs, and anticipated post-operative destination.  General endotracheal anesthesia was initiated, and the patient was positioned supine, arms abducted, with all bony prominences adequately padded.  A 36Fr VisiGi bougie was placed without resistance and placed to suction to decompress the stomach prior to Veress entry.  The abdomen was accessed percutaneously using a Veress needle through Palmer's point. Pneumoperitoneum to 15 mmHg was established with carbon dioxide insufflation.  A 12mm port was placed in the left rectus, 18cm from the xyphoid.  The 30 degree 10mm laparoscope was introduced, and survey of  the abdomen demonstrated normal gastric anatomy, without evidence of injury associated with abdominal entry or Veress placement. Subsequent trocars were placed under direct visualization. A 5mm trocar was placed in the left midclavicular line, inferior to the costal margin.   A 12mm port was placed through the right rectus sheath, and an additional 5mm port was placed along the right midclavicular line inferior to the costal margin.   The remaining 5mm port was placed along the lateral, right costal margin to facilitate placement of the diamond flex liver retractor, which was used to elevate the left lateral lobe of the liver exposing the hiatus and the gastroesophageal junction.  The anterior gastric fat pad was mobilized and there was no evidence of hiatal hernia.      The VisiGi was withdrawn into the esophagus by the anesthesia team. The pylorus was identified. Dissection was carried out just off the greater curvature of the stomach in the midbody with the ligasure device. The lesser sac was entered. Dissection line was carried proximally along the greater curvature of the stomach, with care taken to avoid injury to the gastroepiploic vessels. The greater curvature was carefully dissected away from the spleen. Short gastric vessels were divided with the ligasure. The angle of His was freed up from the left crus of the diaphragm using a combination of straight ligasure and blunt dissection. The left crus was completely exposed. The transection line along the greater curvature was carried distally to an area about 6 cm proximal to the pylorus. Filmy posterior attachments between the stomach and pancreas were divided with the ligasure. The greater curvature of the stomach was mobile and ready for gastric stapling. No hiatal hernia was identified.     A tube time out was performed. No other tubes or devices, such as a temp probe, was present in the stomach or esophagus. The creation of the sleeve gastrectomy was initiated using a longitudinal fire of a 60-mm linear-cutting stapler purple load with staple line reinforcement (total loads: 60 mm reinforced: 4, 60 mm Tan:1), starting 6 cm proximal to the pylorus. After the first fire, the bougie was advanced into the distal stomach and placed into suction to hold the stomach in place. The sleeve gastrectomy was completed using serial longitudinal fires with the 60-mm linear-cutting stapler reinforced purple loads, comfortably next to the sizing bougie. Care was taken to avoid narrowing the sleeve at the incisura angularis. Care was also taken to veer the staple line 1 cm lateral to the angle of His at the most proximal aspect. The stomach was completely divided and set aside for later extraction. Inspection of the staple line revealed adequate hemostasis.    An air leak test was performed insuflating air with the ViSiGi. The leak test was negative for leak.    The lateral stomach was then extracted through the RUQ incision with minimal stretching of the fascia after insertion of a abdominal wall wound protector. The wound protector was removed.  0 Vicryl sutures were placed in the fascia at the extraction site and LUQ trocar site using the Ranfac suture passer.     A bilateral transversalis abdominis and rectus sheath block was performed with 60cc of 0.25% bupivicaine with epinephrine under direct visualization.    The operative field was examined. Visual sweep was negative for retained foreign body and counts were correct. There was adequate hemostasis after decreasing pneumoperitoneum to 8 mm Hg. The liver retractor was removed. The abdomen was desufflated and trocars were removed. The fascial sutures  were secured. Incisions were irrigated with saline solution. Counts were reported complete. All skin incisions were reapproximated using 4-0 monocryl and skin glue. The patient tolerated the procedure well and she was extubated and transferred to the recovery room in stable condition.      Pursuant to [employer], I certify that I was present and scrubbed for the entirety of the case.      Rowe Clack, MD, MD, MPH  October 26, 2022 10:00 AM

## 2022-10-27 LAB — BASIC METABOLIC PANEL
ANION GAP: 11 mmol/L (ref 5–14)
BLOOD UREA NITROGEN: 5 mg/dL — ABNORMAL LOW (ref 9–23)
BUN / CREAT RATIO: 8
CALCIUM: 8.9 mg/dL (ref 8.7–10.4)
CHLORIDE: 104 mmol/L (ref 98–107)
CO2: 27 mmol/L (ref 20.0–31.0)
CREATININE: 0.63 mg/dL
EGFR CKD-EPI (2021) FEMALE: >90 mL/min/{1.73_m2} (ref >=60)
GLUCOSE RANDOM: 102 mg/dL (ref 70–179)
POTASSIUM: 3 mmol/L — ABNORMAL LOW (ref 3.4–4.8)
SODIUM: 142 mmol/L (ref 135–145)

## 2022-10-27 LAB — CBC
HEMATOCRIT: 34.4 % (ref 34.0–44.0)
HEMOGLOBIN: 11.6 g/dL (ref 11.3–14.9)
MEAN CORPUSCULAR HEMOGLOBIN CONC: 33.6 g/dL (ref 32.0–36.0)
MEAN CORPUSCULAR HEMOGLOBIN: 28.3 pg (ref 25.9–32.4)
MEAN CORPUSCULAR VOLUME: 84.3 fL (ref 77.6–95.7)
MEAN PLATELET VOLUME: 7.9 fL (ref 6.8–10.7)
PLATELET COUNT: 355 10*9/L (ref 150–450)
RED BLOOD CELL COUNT: 4.08 10*12/L (ref 3.95–5.13)
RED CELL DISTRIBUTION WIDTH: 13.5 % (ref 12.2–15.2)
WBC ADJUSTED: 10.1 10*9/L (ref 3.6–11.2)

## 2022-10-27 LAB — MAGNESIUM: MAGNESIUM: 1.7 mg/dL (ref 1.6–2.6)

## 2022-10-27 LAB — PHOSPHORUS: PHOSPHORUS: 3 mg/dL (ref 2.4–5.1)

## 2022-10-27 MED ORDER — ONDANSETRON 4 MG DISINTEGRATING TABLET
ORAL_TABLET | Freq: Four times a day (QID) | ORAL | 0 refills | 5.00000 days | Status: CP | PRN
Start: 2022-10-27 — End: 2022-10-27
  Filled 2022-10-27: qty 56, 14d supply, fill #0

## 2022-10-27 MED ORDER — PROCHLORPERAZINE MALEATE 5 MG TABLET
ORAL_TABLET | Freq: Four times a day (QID) | ORAL | 0 refills | 2.00000 days | Status: CP | PRN
Start: 2022-10-27 — End: 2022-11-03
  Filled 2022-10-27: qty 5, 2d supply, fill #0

## 2022-10-27 MED ORDER — OMEPRAZOLE 20 MG CAPSULE,DELAYED RELEASE
ORAL_CAPSULE | Freq: Every day | ORAL | 0 refills | 30.00000 days | Status: CP
Start: 2022-10-27 — End: 2022-11-26
  Filled 2022-10-27: qty 60, 30d supply, fill #0

## 2022-10-27 MED ORDER — ACETAMINOPHEN 160 MG/5 ML ORAL LIQUID
Freq: Three times a day (TID) | ORAL | 0 refills | 14.00000 days | Status: CP
Start: 2022-10-27 — End: 2022-11-10
  Filled 2022-10-27: qty 473, 5d supply, fill #0

## 2022-10-27 MED ORDER — GABAPENTIN 250 MG/5 ML ORAL SOLUTION
Freq: Three times a day (TID) | ORAL | 0 refills | 14.00000 days | Status: CP
Start: 2022-10-27 — End: 2022-11-10
  Filled 2022-10-27: qty 252, 14d supply, fill #0

## 2022-10-27 MED ORDER — METHOCARBAMOL 500 MG TABLET
ORAL_TABLET | Freq: Four times a day (QID) | ORAL | 0 refills | 14.00000 days | Status: CP
Start: 2022-10-27 — End: 2022-11-10
  Filled 2022-10-27: qty 112, 14d supply, fill #0

## 2022-10-27 MED ORDER — OXYCODONE 5 MG/5 ML ORAL SOLUTION
ORAL | 0 refills | 5.00000 days | Status: CP | PRN
Start: 2022-10-27 — End: 2022-11-01
  Filled 2022-10-27: qty 150, 5d supply, fill #0

## 2022-10-27 MED ORDER — SIMETHICONE 40 MG/0.6 ML ORAL DROPS,SUSPENSION
Freq: Three times a day (TID) | ORAL | 0 refills | 14.00000 days | Status: CP
Start: 2022-10-27 — End: 2022-11-10
  Filled 2022-10-27: qty 15, 9d supply, fill #0

## 2022-10-27 MED ORDER — METFORMIN 500 MG TABLET
ORAL_TABLET | Freq: Two times a day (BID) | ORAL | 11 refills | 30.00000 days | Status: CP
Start: 2022-10-27 — End: 2023-10-22
  Filled 2022-10-27: qty 60, 30d supply, fill #0

## 2022-10-27 MED ADMIN — gabapentin (NEURONTIN) oral solution: 300 mg | ORAL | @ 02:00:00

## 2022-10-27 MED ADMIN — ceFAZolin (ANCEF) 3 g in sodium chloride (NS) 0.9 % 100 mL IVPB: 3 g | INTRAVENOUS | @ 17:00:00 | Stop: 2022-10-27

## 2022-10-27 MED ADMIN — gabapentin (NEURONTIN) oral solution: 300 mg | ORAL | @ 19:00:00 | Stop: 2022-10-27

## 2022-10-27 MED ADMIN — potassium chloride 10 mEq in 100 mL IVPB: 10 meq | INTRAVENOUS | @ 16:00:00 | Stop: 2022-10-27

## 2022-10-27 MED ADMIN — ceFAZolin (ANCEF) 3 g in sodium chloride (NS) 0.9 % 100 mL IVPB: 3 g | INTRAVENOUS | @ 07:00:00 | Stop: 2022-10-27

## 2022-10-27 MED ADMIN — pantoprazole (Protonix) oral suspension: 40 mg | ORAL | @ 16:00:00 | Stop: 2022-10-27

## 2022-10-27 MED ADMIN — ondansetron (ZOFRAN) injection 4 mg: 4 mg | INTRAVENOUS | @ 23:00:00 | Stop: 2022-10-27

## 2022-10-27 MED ADMIN — magnesium sulfate 2gm/50mL IVPB: 2 g | INTRAVENOUS | @ 19:00:00 | Stop: 2022-10-27

## 2022-10-27 MED ADMIN — potassium chloride 10 mEq in 100 mL IVPB: 10 meq | INTRAVENOUS | @ 14:00:00 | Stop: 2022-10-27

## 2022-10-27 MED ADMIN — gabapentin (NEURONTIN) oral solution: 300 mg | ORAL | @ 10:00:00 | Stop: 2022-10-27

## 2022-10-27 MED ADMIN — lactated Ringers infusion: 150 mL/h | INTRAVENOUS | @ 06:00:00 | Stop: 2022-10-27

## 2022-10-27 MED ADMIN — magnesium sulfate 2gm/50mL IVPB: 2 g | INTRAVENOUS | @ 14:00:00 | Stop: 2022-10-27

## 2022-10-27 MED ADMIN — acetaminophen (OFIRMEV) 10 mg/mL injection 1,000 mg: 1000 mg | INTRAVENOUS | @ 06:00:00 | Stop: 2022-10-27

## 2022-10-27 MED ADMIN — methocarbamoL (ROBAXIN) 1,000 mg in sodium chloride (NS) 0.9 % 50 mL IVPB: 1000 mg | INTRAVENOUS | @ 10:00:00 | Stop: 2022-10-27

## 2022-10-27 MED ADMIN — simethicone (MYLICON) oral drops: 40 mg | ORAL | @ 02:00:00

## 2022-10-27 MED ADMIN — ondansetron (ZOFRAN) injection 4 mg: 4 mg | INTRAVENOUS | @ 16:00:00 | Stop: 2022-10-27

## 2022-10-27 MED ADMIN — methocarbamoL (ROBAXIN) 1,000 mg in sodium chloride (NS) 0.9 % 50 mL IVPB: 1000 mg | INTRAVENOUS | @ 02:00:00

## 2022-10-27 MED ADMIN — methocarbamoL (ROBAXIN) tablet 1,000 mg: 1000 mg | ORAL | @ 23:00:00 | Stop: 2022-10-27

## 2022-10-27 MED ADMIN — potassium chloride 10 mEq in 100 mL IVPB: 10 meq | INTRAVENOUS | @ 20:00:00 | Stop: 2022-10-27

## 2022-10-27 MED ADMIN — potassium chloride 10 mEq in 100 mL IVPB: 10 meq | INTRAVENOUS | @ 17:00:00 | Stop: 2022-10-27

## 2022-10-27 MED ADMIN — simethicone (MYLICON) oral drops: 40 mg | ORAL | @ 17:00:00 | Stop: 2022-10-27

## 2022-10-27 MED ADMIN — acetaminophen (TYLENOL) oral liquid: 1000 mg | ORAL | @ 16:00:00 | Stop: 2022-10-27

## 2022-10-27 MED ADMIN — potassium chloride 10 mEq in 100 mL IVPB: 10 meq | INTRAVENOUS | @ 21:00:00 | Stop: 2022-10-27

## 2022-10-27 MED ADMIN — methocarbamoL (ROBAXIN) tablet 1,000 mg: 1000 mg | ORAL | @ 17:00:00 | Stop: 2022-10-27

## 2022-10-27 MED ADMIN — enoxaparin (LOVENOX) syringe 40 mg: 40 mg | SUBCUTANEOUS | @ 03:00:00

## 2022-10-27 MED ADMIN — simethicone (MYLICON) oral drops: 40 mg | ORAL | @ 10:00:00 | Stop: 2022-10-27

## 2022-10-27 MED ADMIN — ondansetron (ZOFRAN-ODT) disintegrating tablet 4 mg: 4 mg | ORAL | @ 06:00:00 | Stop: 2022-10-27

## 2022-10-27 MED ADMIN — potassium chloride 10 mEq in 100 mL IVPB: 10 meq | INTRAVENOUS | @ 19:00:00 | Stop: 2022-10-27

## 2022-10-27 MED ADMIN — ondansetron (ZOFRAN-ODT) disintegrating tablet 4 mg: 4 mg | ORAL | @ 10:00:00 | Stop: 2022-10-27

## 2022-10-27 MED ADMIN — acetaminophen (TYLENOL) oral liquid: 1000 mg | ORAL | @ 23:00:00 | Stop: 2022-10-27

## 2022-10-27 MED ADMIN — enoxaparin (LOVENOX) syringe 40 mg: 40 mg | SUBCUTANEOUS | @ 14:00:00 | Stop: 2022-10-27

## 2022-10-27 NOTE — Unmapped (Signed)
Adult Nutrition Assessment Note    Visit Type: MD Consult  Reason for Visit: Education (Nutrition)      HPI & PMH:  41 year old female s/p Laparoscopic sleeve gastrectomy 10/26/22      Nutrition Focused Physical Exam:             Nutrition Evaluation  Overall Impressions: Nutrition-Focused Physical Exam not indicated due to lack of malnutrition risk factors. (10/27/22 1040)       NUTRITIONALLY RELEVANT DATA     Nutritionally pertinent meds, labs, hospital course reviewed.      Food and Nutrition History: Patient reports good PO intakes and tolerance to pre-surgery diet PTA. She has bariatric mvi and protein drinks available at home. We reviewed post bariatric surgery diet (including but not limited to: diet stages, consistencies, meal intervals and volumes) with written and verbal information provided during RD visit today.  Patient was receptive to information and expressed understanding.     Current Nutrition:  Oral intake        Malnutrition Assessment using AND/ASPEN Clinical Characteristics:    Patient does not meet AND/ASPEN criteria for malnutrition at this time (10/27/22 1047)       GOALS and EVALUATION     Food/Nutrition Knowledge, Lifestyle Modifications:       - Patient will be willing and ready to learn about prescribed diet of Post Bariatric Surgery Diet   - Patient will demonstrate verbal understanding of prescribed Post Bariatric Surgery Diet   - Patient will make appropriate nutritional choices with regards to prescribed diet of Post Bariatric Surgery Diet    Assessment of motivation, barriers, or compliance:  No concerns identified at this time      NUTRITION ASSESSMENT     Food and nutrition-related educational needs as related to new modified diet post surgery as evidenced by need for nutrition education prior to discharge.     Discharge Planning:   Monitor via CAPP rounds for any discharge planning needs.      NUTRITION INTERVENTIONS and RECOMMENDATION     1. Patient educated on Post Bariatric Surgery Diet   2. Printed information given for patient to refer to post discharge.      RD Follow Up Parameters:  Signing off at this time (Please reconsult if needed)    Gladys Damme MS, RD, LD, CNSC  [phone]

## 2022-10-27 NOTE — Unmapped (Signed)
Patient is alert and oriented. Able to verbalize needs. VSS. Ambulated to bathroom to void. PRN oxycodone given for pain management. Lap sites CDI. Instructed to use call bell for assistance.   Problem: Wound  Goal: Optimal Coping  Outcome: Progressing  Goal: Optimal Functional Ability  Outcome: Progressing  Goal: Absence of Infection Signs and Symptoms  Outcome: Progressing  Goal: Improved Oral Intake  Outcome: Progressing  Goal: Optimal Pain Control and Function  Outcome: Progressing  Goal: Skin Health and Integrity  Outcome: Progressing  Goal: Optimal Wound Healing  Outcome: Progressing     Problem: Adult Inpatient Plan of Care  Goal: Plan of Care Review  Outcome: Progressing  Goal: Patient-Specific Goal (Individualized)  Outcome: Progressing  Goal: Absence of Hospital-Acquired Illness or Injury  Outcome: Progressing  Goal: Optimal Comfort and Wellbeing  Outcome: Progressing  Goal: Readiness for Transition of Care  Outcome: Progressing  Goal: Rounds/Family Conference  Outcome: Progressing

## 2022-10-28 NOTE — Unmapped (Signed)
Patient is alert and oriented. Able to verbalize needs. VSS. Tolerating diet. Magnesium and potassium replacement today. Discharge instructions reviewed. IV removed. Discharged to lobby.    Problem: Wound  Goal: Optimal Coping  Outcome: Resolved  Goal: Optimal Functional Ability  Outcome: Resolved  Goal: Absence of Infection Signs and Symptoms  Outcome: Resolved  Goal: Improved Oral Intake  Outcome: Resolved  Goal: Optimal Pain Control and Function  Outcome: Resolved  Goal: Skin Health and Integrity  Outcome: Resolved  Goal: Optimal Wound Healing  Outcome: Resolved     Problem: Adult Inpatient Plan of Care  Goal: Plan of Care Review  Outcome: Resolved  Goal: Patient-Specific Goal (Individualized)  Outcome: Resolved  Goal: Absence of Hospital-Acquired Illness or Injury  Outcome: Resolved  Goal: Optimal Comfort and Wellbeing  Outcome: Resolved  Goal: Readiness for Transition of Care  Outcome: Resolved  Goal: Rounds/Family Conference  Outcome: Resolved

## 2022-10-28 NOTE — Unmapped (Signed)
Discharge Summary    Admit date: 10/26/2022    Discharge date and time: 10/27/22    Discharge to:  Home    Discharge Service: [facility])    Discharge Attending Physician: No att. providers found    Discharge  Diagnoses: Morbid obesity    Secondary Diagnosis: Principal Problem:    Morbid obesity with BMI of 45.0-49.9, adult (CMS-HCC) (POA: Not Applicable)  Active Problems:    Essential hypertension (POA: Yes)    Migraines (POA: Yes)    Sleep apnea, obstructive (POA: Yes)    Hidradenitis suppurativa (POA: Yes)    Incisional hernia (POA: Yes)    Mixed hyperlipidemia (POA: Yes)  Resolved Problems:    * No resolved hospital problems. *      OR Procedures:    LAPAROSCOPY, SURGICAL, GASTRIC RESTRICTIVE PROCEDURE; LONGITUDINAL GASTRECTOMY  Bilateral - TRANSVERSUS ABDOMINIS PLANE (TAP) BLOCK (ABDOMINAL PLANE BLOCK, RECTUS SHEATH BLOCK) BILATERAL; BY INJECTIONS (INCLUDES IMAGING GUIDANCE, WHEN PERFORMED)  Date  10/26/2022  -------------------     Ancillary Procedures: no procedures    Discharge Day Services: none    Subjective   Pain and nausea controlled on oral medications.  1 episode of trace emesis while taking liquid medication, self resolved, no additional nausea or emesis throughout the day.  Tolerating bariatric full liquid diet.  Ambulating around room.  Voiding spontaneously.    Objective   No data found.    I/O this shift:  In: 3280 [I.V.:2800; IV Piggyback:480]  Out: 3385 [Urine:3375; Blood:10]    General Appearance:   No acute distress  Lungs:                Normal work of breathing on room air  Heart:                           Normal rate  Abdomen:                Soft, non-tender, non-distended, incisions clean dry intact without erythema or drainage  Extremities:              Warm and well perfused    Hospital Course:      Patient is a 41 year old female with a history of morbid obesity, HS, HTN, DMII, OSA on CPAP.     On 10/26/22, She underwent laparoscopic sleeve gastrectomy.    Intraoperative findings were Normal gastric anatomy, No hiatal hernia.    She tolerated the procedure well, there were no intraoperative complications, she was extubated in the operating room, and she was transferred to the regular nursing floor.    Her diet was slowly advanced and at the time of discharge she was tolerating a bariatric full liquid diet. She was able to void spontaneously and post op pain and nausea were well-controlled with P.O. pain and antiemetic medications.  She received extensive nutritional counseling regarding advancement of her diet after bariatric surgery.  She will be discharged on POD#1 in stable condition with a prescription for oral pain and antiemetic medications and scheduled to follow up with [place] in clinic.    Perioperatively, her Humira that she takes for hidradenitis suppurativa was held beginning 09/29/2022. She was instructed to not take her home Humira for 2 weeks postoperatively, but should resume taking it afterwards.    Home metformin extended release held during her hospitalization and was converted to immediate release equivalents to facilitate crushing in the setting of recent bariatric surgery.  She was  instructed to follow-up with her primary care provider to discuss further diabetes management.    She was persistently hypertensive to the 140s to 160s systolic during her hospitalization.  Given her recent bariatric surgery, her home chlorthalidone regimen was changed.  She was instructed to measure her blood pressure daily and take 2.5 mg chlorthalidone if her systolic blood pressures greater than 150.  She was instructed to follow-up with the provider who prescribes this medication within 2 weeks to discuss management of her blood pressure as an outpatient.    Condition at Discharge: Improved  Discharge Medications:      Medication List      START taking these medications     Children's acetaminophen 160 mg/5 mL solution; Generic drug:   acetaminophen; Take 31.3 mL (1,000 mg total) by mouth every eight (8)   hours for 14 days.   gabapentin 250 mg/5 mL oral solution; Commonly known as: NEURONTIN; Take   6 mL (300 mg total) by mouth every eight (8) hours for 14 days.   INFANTS' MYLICON 40 mg/0.6 mL drops; Generic drug: simethicone; Take 0.6   mL (40 mg total) by mouth every eight (8) hours for 14 days.   metFORMIN 500 MG tablet; Commonly known as: GLUCOPHAGE; Take 1 tablet   (500 mg total) by mouth in the morning and 1 tablet (500 mg total) in the   evening. Take with meals.; Replaces: metFORMIN 500 MG 24 hr tablet   methocarbamoL 500 MG tablet; Commonly known as: ROBAXIN; Take 2 tablets   (1,000 mg total) by mouth four (4) times a day for 14 days.   omeprazole 20 MG capsule; Commonly known as: PriLOSEC; Take 2 capsules   (40 mg total) by mouth daily. Do not crush or chew.   ondansetron 4 MG disintegrating tablet; Commonly known as: ZOFRAN-ODT;   Dissolve 1 tablet (4 mg total) in mouth every six (6) hours for 14 days.   oxyCODONE 5 mg/5 mL solution; Commonly known as: ROXICODONE; Take 5 mL   (5 mg total) by mouth every four (4) hours as needed for pain for up to 5   days.   prochlorperazine 5 MG tablet; Commonly known as: COMPAZINE; Take 1   tablet (5 mg total) by mouth every six (6) hours as needed for nausea (if   additional zofran doesn't help).     CHANGE how you take these medications     adalimumab 40 mg/0.4 mL injection; Commonly known as: HUMIRA(CF) PEN;   Inject 0.4 mL (40 mg total) under the skin every seven (7) days. Do not   take for two weeks after surgery. After then, resume taking it as   directed.; Start taking on: November 09, 2022; What changed: additional   instructions, These instructions start on November 09, 2022. If you are   unsure what to do until then, ask your doctor or other care provider.   chlorthalidone 50 MG tablet; Commonly known as: HYGROTON; Take 0.5   tablets (25 mg total) by mouth daily as needed (Measure your blood   pressure daily in the morning. If your systolic blood pressure (top   number) is greater than 150 mmHg, take 25 mg (1/2 tablet)).; What changed:   how much to take, when to take this, reasons to take this     CONTINUE taking these medications     BARIATRIC MULTIVITAMINS 45 mg iron- 800 mcg-120 mcg Cap; Generic drug:   multivitamin-min-iron-FA-vit K   CALCIUM 500 ORAL   empty  container Misc; Use as directed to dispose of Humira pens.   NEXPLANON 68 mg Impl; Generic drug: etonogestrel     STOP taking these medications     metFORMIN 500 MG 24 hr tablet; Commonly known as: GLUCOPHAGE-XR;   Replaced by: metFORMIN 500 MG tablet       Pending Test Results:     Discharge Instructions:  Activity:   Activity Instructions       ASSIGN TO ENHANCED RECOVERY AFTER SURGERY PATHWAY            Diet:    Other Instructions:  Other Instructions       Discharge instructions      Do not take your humira for two weeks after your surgery. Resume taking it as directed.    For your chlorthalidone, measure your blood pressure daily in the morning. If your systolic blood pressure (top number) is greater than 150 mmHg, take 25 mg (1/2 tablet).    Follow up with your primary care doctor within one week or as soon as possible to discuss your blood pressure management.          1) What will I experience after my operation?    - You may experience some discomfort in your lower chest or upper abdomen after you eat very small meals. You may even vomit. This is a sign to stop eating because your smaller stomach is full. You may also be tired and have soreness around your incisions. These side effects will decrease over time.       2) What can I eat?     Follow the directions given by the nutritionist or in your binder (at the back of the book) Maintain until your follow up surgery clinic appointment.     Some key things to remember:     - At day 7, you will start a low-sugar, pureed diet.  For the whole second week after surgery, you will eat only pureed foods as your stomach heals.  All foods should be the texture of baby food or applesauce with no lumps or food pieces.     - Eat slowly and take small bites.     - Schedule 6-8 small meals each day.    - You do not have to measure, but you must stop eating when you feel fullness. Your small stomach pouch holds about 1 ounce or 2 tablespoons at first.  It is located behind your lower breastbone, so expect to feel full there. If you keep eating, you may have stomach pain and vomiting.    - Drink protein drinks (such as 100% whey or soy powder made with water or skim milk). You need protein for healing after surgery and for preventing muscle loss. Once your diet is advanced, some options for high-protein foods include lean meats, eggs, beans, tofu, skim or 1% milk, and sugar-free, low-fat dairy products.  Aim for 60-80 grams of protein per day.    - Do not drink liquids with meals. Drink small amounts of liquids 30-60 minutes before or after meals. Drink at least 8 cups (1 cup = 240 ml = 8 oz, or 64 ounces a day.) of liquids per day. Liquids include water, iced tea (unsweetened or sweetened by sugar substitutes), or any, no-sugar-added drink, such as Jefm Miles, or Fruit2O.  Avoid juice unless it is diluted.  No soda.  It is OK to take a sip of liquid if you are having trouble swallowing a bite of food.  3) What medicines will I be taking?    A review of the medicines you are taking and any prescription changes will be done before you leave the hospital. Here are some hints on what medicines to avoid:    - Avoid large pills/tablets/capsules until you are eating soft solid food.    - Until then pills must be crushed, or taken in liquid form. Some medicines have a time-release coating and cannot be crushed, your doctors will change them to the appropriate equivalent.    - Start taking chewable vitamin and mineral supplements. This is something you will need to do for the rest of your life.  You will take multivitamins, calcium citrate, vitamin D, and iron.  See the Gastric Bypass Supplement Guide for specific details.    - You will be taking an acid reducing medication for 1 month, and you will be discharged with a prescription for Prilosec capsules. Please open the capsules up and sprinkle the contents on apple sauce or another type of food. Please contact us if you do not receive a prescription for this.    - Start taking chewable vitamin and mineral supplements.  This is something you will need to do for the rest of your life.  You will take multivitamins, calcium citrate, vitamin D, and iron.  See the Gastric Bypass Supplement Guide for specific details.    - Depending on your calculated risk for blood clots after surgery, which is based on factors including co morbidities and type of surgery, you may be going home with injectable Lovenox (also known as enoxaparin) for either a 2 or 4 week course. The nurses will teach you how to give yourself these shots prior to discharge.         4) What will I take if I have pain or nausea?    We cannot ever take all of the pain away after surgery. However, the pain should improve day by day. It is important to minimize narcotic pain medication after surgery, as these medications carry potential for addiction.  This potential is increased after bariatric surgery.  Should your pain not be well enough controlled with the non-narcotic medications listed below to allow you to perform daily activities and to take deep breaths, you can consider taking some of the narcotic pain medication we will provide you after surgery.    Some specifics:     - We will give you a prescription for liquid narcotic pain medicine. Take your pain medicine about 30 minutes before you shower or walk for the first few days after you get home.    - We will also give you some other prescriptions for multimodal, non-narcotic pain control, which can include liquid gabapentin, Robaxin pills (which are to be crushed until you are instructed it is okay to take pills), and liquid tylenol. The liquid Tylenol is no different than what is sold at the pharmacy as infant tylenol.     - You will be given a prescription for oral antinausea medication, usually zofran. It is a good idea to call your local pharmacy to make sure they have the liquid pain medicine in stock. The nausea medicine should be taken to prevent retching (dry heaves) for any reason for 3-6 months after you go home.      - You will also be given a prescription for bloating, called simethicone, which is also known by its brand name, Gas-X.         5) How active  can I be?    - Being up and around is very important because it can help prevent complications, like blood clots and lung infections, and helps with the healing process. You can do your usual activities, walk up stairs and be outdoors. If your incision begins to hurt, you may need to cut back on your activities for a while and then try again. Do not drive or operate heavy machinery while taking narcotic pain medications.         6) How much can I lift?    - Do not lift more than 10 pounds for at least 4 weeks after your operation. Talk about lifting and other activities with your doctor at your clinic visit.         7) When can I return to work?    - You can return to light duty work once you feel better and are not taking narcotic pain medicines, usually in 1-2 weeks. If your job is strenuous, you may need 3 or more weeks off from work. Call Eldridge Dace, RN at the GI Surgery office at [phone] if you need help with Work Leave Forms.         8) How do I take care of my dressings?    - Your wounds were glued. The coating will peel off in 1-2 weeks. Inspect surgical sites at least twice daily, call for spreading redness, purulent discharge, or increasing bleeding or drainage, or for separation of wounds. Remove the dressing over your old JP drain site after 48 hours, if a JP was placed. Dress it with dry gauze and paper tape as needed to avoid soilage.         9) When can I shower?    - You can shower two days after your surgery. Leave the glue on, and just let water run over your wounds. Do not scrub them or use soap. Pat the wounds dry. Do not rub them with a towel. Do not soak in the bathtub or go swimming until your incisions are healed.         10) What if I am constipated?    - It is ok to take over the counter stool softeners, like colace while on pain medication. If you have not moved your bowels in a day or two, it is ok to take over the counter Milk of Magnesia, or Dulcolax, Magnesium Citrate, or Miralax.              11) Call the clinic for:    - fever >101.45F by mouth, uncontrolled nausea or vomiting, pain uncontrolled with prescribed medication, or inablily to pass stool for more than 3 days; also call for any new or concerning symptoms.         Whom to call:    - During regular business hours: (Monday-Friday, 8am-4:30 pm) you may call the GI Surgery Office at [phone].    - It is important to maintain your follow up surgery clinic appointment.    - For emergencies hours business hours please call the [facility] operator at [phone] and ask for the Surgical Resident on-call. You will be directed to a surgery resident who likely is not familiar with your specific case, but can help you deal with any emergencies that cannot wait until regular business hours. Please be aware that this person is responding to many in-hospital emergencies and patient issues and may not answer your phone call immediately.  Labs and Other Follow-ups after Discharge:  Follow Up instructions and Outpatient Referrals     Discharge instructions          Future Appointments:  Appointments which have been scheduled for you      Nov 14, 2022  3:30 PM  (Arrive by 3:00 PM)  RETURN  GENERAL with Rowe Clack, MD  [facility] MULTISPECIALTY SURGERY GI SURGERY [facility]) [address]  [place] [address]  [phone]             Concha Pyo, MD, PhD  General Surgery PGY-1    Attestation    I communicated with the responsible trainee and was available via phone/pager.     Arman Filter, MD, MPH  10/28/2022  9:13 AM

## 2022-11-14 ENCOUNTER — Ambulatory Visit
Admit: 2022-11-14 | Discharge: 2022-11-15 | Payer: PRIVATE HEALTH INSURANCE | Attending: Student in an Organized Health Care Education/Training Program | Primary: Student in an Organized Health Care Education/Training Program

## 2022-11-14 MED ORDER — URSODIOL 300 MG CAPSULE
ORAL_CAPSULE | Freq: Two times a day (BID) | ORAL | 5 refills | 30 days | Status: CP
Start: 2022-11-14 — End: 2023-05-13

## 2022-11-14 NOTE — Unmapped (Signed)
Outpatient Postoperative Note    PRIMARY CARE PROVIDER: Lilian Kapur, MD     REFERRING PROVIDER: Fabio Neighbors, MD  [address]  [facility] [address] [place],  [place] [address]    Subjective:      Wendy Chen is a 41 y.o. female who is here today for a postoperative visit following laparoscopic sleeve gastrectomy for morbid obesity.  Surgery was performed on 10/26/22.  She reports mild epigastric soreness that is gradually improving.  She had 1 episode of emesis last week after eating [place] chicken but otherwise she is tolerating the expected diet without emesis or significant GERD.  She reports intermittent constipation for which she is taking milk of magnesia and working on improving her water intake.  She feels she is peeing regular amount for her. She denies fevers, chills or constitutional complaints. She been taking Prilosec and multivitamin as instructed.  She is walking for exercise and looks forward to getting back to the gym.     Objective:     BP 148/91 Comment: recheck - Pulse 85  - Temp 36.1 ??C (96.9 ??F)  - Ht 170.2 cm (5' 7)  - Wt (!) 128.2 kg (282 lb 9.6 oz)  - LMP 07/31/2022  - BMI 44.26 kg/m??     Weight History:  Wt Readings from Last 3 Encounters:   11/14/22 (!) 128.2 kg (282 lb 9.6 oz)   10/26/22 (!) 135.4 kg (298 lb 8.1 oz)   09/29/22 (!) 139.5 kg (307 lb 8 oz)       Physical Exam:  General: Unremarkable.   HEENT: No scleral icterus, EOM intact.  Lungs: Normal work of breathing on room air  Heart: Normal rate  Abdomen: Soft, mild epigastric soreness to palpation, non-distended, incision sites healing without fluctuance or cellulitis.  Known ventral hernia  Extremities: Benign.  Neuro: Grossly nonfocal.    Assessment & Plan:   The patient is stable after laparoscopic sleeve gastrectomy. She was given diet and activity advancement instructions.  She was reminded to initiate multivitamins per our prior recommendations.      We gave a prescription for Actigall 300 mg BID for 6 months for gallstone prophylaxis.  We advised the patient to take proton pump inhibitors as needed.      We discussed initiating a plan for exercise over the coming weeks.  We recommended utilization of regular support groups.  We will make a follow-up appointment in about 1 month for a visit with our dietician and our NP. In addition, we will plan follow up at 3 months after surgery for first vitamin and micronutrient assessment.  The patient will contact us sooner if any problems or concerns arise.     She was hypertensive at this visit initial blood pressure 173/98, 148/91 on recheck.  She reports taking her home chlorthalidone dose this morning.  We encouraged her to follow-up with her primary care doctor who prescribes this medication for ongoing management of her blood pressure.    Concha Pyo, MD, PhD  General Surgery PGY-1    Attestation    I saw and evaluated the patient, participating in the key portions of the service.  I reviewed the resident???s note.  I agree with the resident???s findings and plan.     Arman Filter, MD, MPH  11/19/2022  7:48 AM

## 2022-11-14 NOTE — Unmapped (Signed)
Diet and exercise have been discussed at this visit.    You may begin taking the recommended bariatric vitamins daily.   Please start to increase all activity as tolerated.    Today we prescribed a new medication named Actigall.  This medication is taken twice daily for six months only.   This medication is designed to help prevent gallstone formation as you are actively losing weight.       Please schedule an appointment with your primary care for management of any long term medications that may need dosage adjustments due to weight loss.        NEXT STEPS:    Follow up appointment to be scheduled with our dietician in 5 weeks.    Follow up to be scheduled with Ayesha Rumpf, NP in three months.    If you have any questions regarding your visit today please feel free to contact me at the number listed below.     Eldridge Dace, RN, Bariatric Coordinator  Surgical RN Coordinator for Dr. Rowe Clack   [email or web address]  Phone:  [phone]  Fax:       [phone]

## 2022-11-23 NOTE — Unmapped (Signed)
The [facility] has made a second and final attempt to reach this patient to refill the following medication:Humira.      We have left voicemails on the following phone numbers: [phone], have sent a text message to the following phone numbers: [phone] and have sent a Mychart questionnaire..    Dates contacted: 12/21 and 12/28  Last scheduled delivery: 11/10    The patient may be at risk of non-compliance with this medication. The patient should call the [facility] at [phone]  Option 4, then Option 2 (all other specialty patients) to refill medication.    Valere Dross   [facility] Specialty Technician

## 2022-12-07 MED FILL — HUMIRA PEN CITRATE FREE 40 MG/0.4 ML: SUBCUTANEOUS | 28 days supply | Qty: 4 | Fill #4

## 2022-12-19 ENCOUNTER — Ambulatory Visit: Admit: 2022-12-19 | Discharge: 2022-12-20 | Payer: PRIVATE HEALTH INSURANCE

## 2022-12-19 NOTE — Unmapped (Signed)
[facility] Outpatient Nutrition Services   Post-Op Bariatric Medical Nutrition Therapy Consultation       Visit Type: Return Bariatric Nutrition Assessment    Referral Reason: bariatric weight loss surgery      Wendy Chen is a 42 y.o. female seen for medical nutrition therapy for bariatric surgery follow up.  Her medication list, active problem list, and notes from previous encounters were reviewed.       Surgery Date: 10/26/2022  Surgical Procedure: Laparoscopic sleeve gastrectomy   Surgeon: Dr. Yetta Flock  Pre Surgery Weight: 298lb    Anthropometrics   Height: 170.2 cm (5' 7)   Weight: (!) 125.1 kg (275 lb 11.2 oz)  Body mass index is 43.18 kg/m??.  61.27 kg  Weight in (lb) to have BMI = 25: 159.3  Goal weight per patient: No specific goal weight   Weight Change: -23lb/8% of body weight since surgery     Wt Readings from Last 12 Encounters:   12/19/22 (!) 125.1 kg (275 lb 11.2 oz)   11/14/22 (!) 128.2 kg (282 lb 9.6 oz)   10/26/22 (!) 135.4 kg (298 lb 8.1 oz)   09/29/22 (!) 139.5 kg (307 lb 8 oz)   09/01/22 (!) 141.2 kg (311 lb 3.2 oz)   08/21/22 (!) 144.2 kg (318 lb)   08/04/22 (!) 144.2 kg (318 lb)   07/13/22 (!) 144.2 kg (318 lb)   07/12/22 (!) 142.9 kg (315 lb 0.6 oz)   07/07/22 (!) 142.9 kg (315 lb)   07/04/22 (!) 144.2 kg (318 lb)   06/27/22 (!) 144.2 kg (318 lb)          Nutrition Risk Screening:   Food Insecurity: No Food Insecurity (07/07/2022)    Hunger Vital Sign     Worried About Radiation protection practitioner of Food in the Last Year: Never true     Ran Out of Food in the Last Year: Never true       Nutrition Focused Physical Exam:  Nutrition Evaluation  Overall Impressions: Nutrition-Focused Physical Exam not indicated due to lack of malnutrition risk factors. (12/19/22 1345)  Nutrition Designation: Obese class III  (BMI > 39.99 kg/m2) (12/19/22 1345)    Malnutrition Screening:   Patient does not meet AND/ASPEN criteria for malnutrition at this time (12/19/22 1345)    Biochemical Data, Medical Tests and Procedures:  All pertinent labs and imaging reviewed by Stevphen Rochester, RD/LDN at 1:29 PM 12/22/2022.    Lab Results   Component Value Date    HGB 11.6 10/27/2022    HCT 34.4 10/27/2022    MCV 84.3 10/27/2022    IRON 82 06/05/2022    TIBC 378.0 06/05/2022    VITAMINB12 791 06/05/2022    FOLATE 21.4 06/05/2022    VITDTOTAL 23.9 06/05/2022     Lab Results   Component Value Date    ALKPHOS 65 06/05/2022    ALT 18 06/05/2022    AST 17 06/05/2022    BUN 5 (L) 10/27/2022    CREATININE 0.63 10/27/2022    GFRNONAA >=60 10/25/2017    GFRAA >=60 10/25/2017     Lab Results   Component Value Date    CHOL 206 (H) 06/05/2022    HDL 52 06/05/2022    LDL 118 (H) 06/05/2022    NONHDL 154 (H) 06/05/2022    TRIG 179 (H) 06/05/2022    A1C 4.9 06/05/2022    TSH 1.107 06/05/2022       New developments since last nutrition appointment:  Patient Updates:  Patient is eating regular - textured foods.  Patient is having difficulty tolerating skin pickles and slim jim's .    Physical Activity:  Physical activity level is light with no exercise. She is only walking in daily living activities.   Type: N/A   Duration: N/A   Frequency: N/A    Medications and Vitamin/Mineral Supplementation:   All nutritionally pertinent medications reviewed on 12/19/2022.   Nutritionally pertinent medications include: Ursodiol    She is taking bariatric nutrition supplements. BariaticPal chewable (she is unsure if it has iron) + BariatricPal calcium (3 capsules daily)    Micronutrient Deficiency Evaluation   No abnormalities noted  Changes in vision - denies  Difficulty seeing at night -  denies  Numbness or tingling in extremities - denies  Burning in lower extremities - denies  Difficulty remembering/forgetfulness - denies  Changes in gait or stability - denies    Current Outpatient Medications   Medication Sig Dispense Refill    ADALIMUMAB PEN CITRATE FREE 40 MG/0.4 ML Inject 0.4 mL (40 mg total) under the skin every seven (7) days. Do not take for two weeks after surgery. After then, resume taking it as directed. 12 each 3    calcium carbonate (CALCIUM 500 ORAL) Take 3 tablets by mouth daily. Bariatric Calcium Chews- 3 chews at once      chlorthalidone (HYGROTON) 50 MG tablet Take 0.5 tablets (25 mg total) by mouth daily as needed (Measure your blood pressure daily in the morning. If your systolic blood pressure (top number) is greater than 150 mmHg, take 25 mg (1/2 tablet)). 15 tablet 0    empty container Misc Use as directed to dispose of Humira pens. 1 each 2    etonogestrel (NEXPLANON) 68 mg Impl 1 each (68 mg total) by Subdermal route once.      gabapentin (NEURONTIN) 250 mg/5 mL oral solution Take 6 mL (300 mg total) by mouth every eight (8) hours for 14 days. 252 mL 0    HUMIRA PEN CITRATE FREE 40 MG/0.4 ML Inject the contents of 1 pen (40 mg total) under the skin every seven (7) days. 12 each 3    metFORMIN (GLUCOPHAGE) 500 MG tablet Take 1 tablet (500 mg total) by mouth in the morning and 1 tablet (500 mg total) in the evening. Take with meals. 60 tablet 11    multivitamin-min-iron-FA-vit K (BARIATRIC MULTIVITAMINS) 45 mg iron- 800 mcg-120 mcg cap Take 1 tablet by mouth daily.      ursodioL (ACTIGALL) 300 mg capsule Take 1 capsule (300 mg total) by mouth two (2) times a day. 60 capsule 5     No current facility-administered medications for this visit.       Nutrition History:     Dietary Restrictions: No known food allergies or food intolerances.   Food Aversions: Peanut butter chocolate Premier protein shake  Gastrointestinal Issues: Constipation, Nausea   Dumping Syndrome: Denied     Changes in Taste or Smell: Endorses smells are heightened. Denies any taste changes.   Symptoms of hypoglycemia: Denied     Hunger and Satiety:  Patient notes she physically feels hungry, but is quick to feel full.   Excessive Hunger: Denied     24-Hour Recall/Usual Intake:  Up: 5:00am  B 8:00-9:00am: 1/2 scrambled eggs + 2 strips of bacon  S Denied  L 11:30am: 1 Grilled chicken thigh + brussels sprouts   S Denied  D 7:00-8:00pm: 2 chicken wings + salad  HS  Denied     Food-Related History:  Estimated protein intake: Uses MyFitnessPal but has not tracked in awhile.   Beverages:  Gatorade zero, 16-24oz water, crystal light, Protein H20 (1 per day)  Alcohol intake: Denied   Dining Out:  Denied  Meal Schedule:  meals: 3, snacks: 0     Eating Behaviors:  Cravings: Denied    Emotional Eating: Denied issues.   Grazing: Denied issues.   Nighttime Eating: None.  Fast Eating: Denied issues.   Overeating: Denied issues with overeating.     Nutritional Needs:  Energy: 1944 kcals/day [Per [place]-Jeor Equation using last recorded weight, 125.1 kg (12/22/22 1015)]  Protein: 72-87 gm/day [1.0-1.2 gm/kg using adjusted body weight (for a BMI of 25), 72 kg (12/22/22 1015)]  Carbohydrate: < / equal to 45% of kcal  Fluid: 2000-3068mL/day    Nutrition Goals & Evaluation      Avoid weight regain  (New)  Meet nutritional needs (New)    Nutrition goals reviewed, and relevant barriers identified and addressed: none evident. She is evaluated to have good willingness and ability to achieve nutrition goals.     Nutrition Assessment       Orel Riga is 7 weeks s/p Laparoscopic sleeve gastrectomy on 10/26/2022 She has lost 10kg since surgery. She is currently tolerating regular texture foods. Recent diet history indicates she is meeting estimated nutrition needs and is following post-op bariatric diet guidelines. She is likely meeting near goal protein intake of 60-80 grams/day. She  is taking appropriate bariatric nutrition supplements.    Nutrition Intervention      - Nutrition Education: Bariatric diet/eating style. Education resources provided include: after visit summary with patient instructions   Nutrition Tracking/Logging Tool: Using MyFittnessPal     Nutrition Plan:   Please see patient instructions     Follow up will occur in 4 months   Patient is encouraged to contact RD via MyChart. Food/Nutrition-related history, Anthropometric measurements, Biochemical data, medical tests, procedures, Patient understanding or compliance with intervention and recommendations , and Effectiveness of nutrition interventions will be assessed at time of follow-up.         Recommendations for Clinical Team:  1. Please encourage a consistent meal/snack intake with a focus on protein  2. Please encourage exercise outside of daily living activities for a minimum of 150 minutes per week        Time spent 20 minutes

## 2022-12-22 NOTE — Unmapped (Signed)
Nutrition recommendations:     Maintain a consistent meal/snack schedule   -protein every 3-4 hours    -include a protein based snack between meals     Aim for an exercise routine outside of your daily living activities    Continue taking the bariatric nutrition supplements daily    -make sure the bariatricpal has at least 45mg  of iron

## 2022-12-23 ENCOUNTER — Ambulatory Visit
Admit: 2022-12-23 | Discharge: 2022-12-24 | Disposition: A | Discharge status: Home / Self Care | Payer: PRIVATE HEALTH INSURANCE

## 2022-12-24 MED ORDER — CLINDAMYCIN 1 % LOTION
Freq: Two times a day (BID) | TOPICAL | 0 refills | 0 days | Status: CP
Start: 2022-12-24 — End: 2023-12-24

## 2022-12-24 MED ORDER — DOXYCYCLINE HYCLATE 100 MG CAPSULE
ORAL_CAPSULE | Freq: Two times a day (BID) | ORAL | 0 refills | 10 days | Status: CP
Start: 2022-12-24 — End: 2023-01-03

## 2022-12-24 NOTE — Unmapped (Signed)
Patient sent in by derm for eval of lesions under axillae. Hx of HS.

## 2022-12-24 NOTE — Unmapped (Signed)
Hx HS. Has boil under R arm.

## 2022-12-24 NOTE — Unmapped (Signed)
[facility] Emergency Department Provider Note    ED Clinical Impression     Final diagnoses:   Hidradenitis (Primary)   Cellulitis, unspecified cellulitis site       History     CHIEF COMPLAINT:   Chief Complaint   Patient presents with    Wound Check     HPI: Wendy Chen is a pleasant 42 y.o. female with a history of hidradenitis suppurativa who presented to the emergency department today complaining of worsening right and left axillary nodules.  Patient says these have been ongoing for about 1 month.  Within the last week, she had a more painful one on her right with some surrounding cellulitis that has been getting worse.  Endorsing chills, no fevers.  Otherwise overall feeling well.    History provided/supplemented by patient.    Physical Exam   BP 140/96  - Pulse 58  - Temp 36.9 ??C (98.4 ??F) (Oral)  - Resp 13  - Wt (!) 124.7 kg (275 lb)  - SpO2 99%  - BMI 43.07 kg/m??     Constitutional: Alert and oriented. No acute distress. Well-appearing.  Eyes: Conjunctivae are normal.  HEENT: Normocephalic and atraumatic. Conjunctivae clear. No congestion. Moist mucous membranes.   Cardiovascular: Rate as above, regular rhythm. Normal and symmetric distal pulses. Brisk capillary refill. Normal skin turgor.  Respiratory: Normal respiratory effort. Breath sounds are normal. There are no wheezing or crackles heard.  Gastrointestinal: Soft, non-distended, non-tender.  Genitourinary: Deferred.  Musculoskeletal: Non-tender with normal range of motion in all extremities. No lower extremity edema.  Neurologic: Normal speech and language. No gross focal neurologic deficits are appreciated. Patient is moving all extremities equally, face is symmetric at rest and with speech.  Skin: Numerous draining wounds in bilateral axilla.  Area of erythema and tenderness to right arm.  Psychiatric: Mood and affect are normal. Speech and behavior are normal.    Assessment & Plan, ED Course     42 year old female with history above.  She is well-appearing no acute distress.  Has numerous draining wounds to bilateral axilla, 1 area of erythema to right axilla that appears to be cellulitis superimposed on her chronic at bedtime.  Overall my suspicion is for cellulitis, with chronic at bedtime.  Did a bedside ultrasound and there is no obvious abscess, or tappable pocket, no fluctuance on exam.  Given this opted with oral antibiotics, topical antibiotics and outpatient follow-up with dermatology.  Patient was comfortable with this plan.  Patient was discharged.    Diagnostic orders as below.    No orders of the defined types were placed in this encounter.      ED Course:           I have reviewed recent and relevant previous record, including: Outpatient notes - MyChart messages from 1/24.  Escalation of Care including OBS/Admission/Transfer was considered: However, patient was determined to be appropriate for outpatient management.        _____________________________________________________________________    The case was discussed with attending physician who is in agreement with the above assessment and plan    Additional History     PAST MEDICAL HISTORY/PAST SURGICAL HISTORY:   Past Medical History:   Diagnosis Date    AMA age 48 10/04/2016    We discussed the patient's age related risk of aneuploidy.  We discussed screening options including serum screening and NIPT as well as diagnostic options of CVS and amniocentesis.  We discussed risks, benefits and diagnostic capability  of both CVS and amniocentesis.   -ordered genetic consult and first trimester screen  -Age 65 at [facility] -S/p genetic counseling 11/17 -Negative cfDNA -Fetal fraction 1.5% (Fetal fraction is affected by fetal aneuploidy (trisomy 13 and 18), maternal BMI, and other maternal conditions such as preexisting hypertension. In the setting of low fetal fraction (typically defined in the literature as <4%) sensitivity of cfDNA screening may be reduced Janyth Contes, 2015. Reprod Sci).      Disorder of skin or subcutaneous tissue     Heart murmur     benign, no abx prophylaxis required, 2012 echo noted trivial TR    Hidradenitis suppurativa 2018    Hypertension     Migraine     Obesity     Postpartum depression 2007, 2012    Procreative genetic counseling 10/05/2016    Genetic counseling visit on 10/09/16 Aneuploidy screening/ testing: cfDNA; results negative - low fetal fraction Carrier screening: [x]  Cystic fibrosis - neg x 106 mutationss [x]  Spinal muscular atrophy - 3 copies; reduced carrier risk [x]  Hemoglobinopathy screening - normal adult hemoglobin present      Sleep apnea     Snoring 12/08/2016       Past Surgical History:   Procedure Laterality Date    CESAREAN SECTION  2007, 2012    PR CESAREAN DELIVERY ONLY N/A 04/03/2017    Procedure: CESAREAN DELIVERY ONLY;  Surgeon: Barb Merino, MD;  Location: L&D C-SECTION OR SUITES [facility];  Service: Family Planning    PR LAP, GAST RESTRICT PROC, LONGITUDINAL GASTRECTOMY N/A 10/26/2022    Procedure: LAPAROSCOPY, SURGICAL, GASTRIC RESTRICTIVE PROCEDURE; LONGITUDINAL GASTRECTOMY;  Surgeon: Rowe Clack, MD;  Location: MAIN OR [place];  Service: Gastrointestinal    PR REVISE MEDIAN N/CARPAL TUNNEL SURG Right 03/02/2021    Procedure: NEUROPLASTY AND/OR TRANSPOSITION; MEDIAN NERVE AT CARPAL TUNNEL;  Surgeon: Daisy Lazar, MD;  Location: ASC OR [facility];  Service: Orthopedics    PR REVISE MEDIAN N/CARPAL TUNNEL SURG Left 03/16/2021    Procedure: NEUROPLASTY AND/OR TRANSPOSITION; MEDIAN NERVE AT CARPAL TUNNEL;  Surgeon: Daisy Lazar, MD;  Location: ASC OR [facility];  Service: Orthopedics    PR TAP BLOCK BILATERAL BY INJECTION(S) Bilateral 10/26/2022    Procedure: TRANSVERSUS ABDOMINIS PLANE (TAP) BLOCK (ABDOMINAL PLANE BLOCK, RECTUS SHEATH BLOCK) BILATERAL; BY INJECTIONS (INCLUDES IMAGING GUIDANCE, WHEN PERFORMED);  Surgeon: Rowe Clack, MD;  Location: MAIN OR [facility];  Service: Gastrointestinal    PR UPPER GI ENDOSCOPY,BIOPSY N/A 08/04/2022    Procedure: UGI ENDOSCOPY; WITH BIOPSY, SINGLE OR MULTIPLE;  Surgeon: Jules Husbands, MD;  Location: GI PROC[facility];  Service: Gastroenterology    WISDOM TOOTH EXTRACTION         MEDICATIONS:   No current facility-administered medications for this encounter.    Current Outpatient Medications:     ADALIMUMAB PEN CITRATE FREE 40 MG/0.4 ML, Inject 0.4 mL (40 mg total) under the skin every seven (7) days. Do not take for two weeks after surgery. After then, resume taking it as directed., Disp: 12 each, Rfl: 3    calcium carbonate (CALCIUM 500 ORAL), Take 3 tablets by mouth daily. Bariatric Calcium Chews- 3 chews at once, Disp: , Rfl:     chlorthalidone (HYGROTON) 50 MG tablet, Take 0.5 tablets (25 mg total) by mouth daily as needed (Measure your blood pressure daily in the morning. If your systolic blood pressure (top number) is greater than 150 mmHg, take 25 mg (1/2 tablet))., Disp: 15 tablet, Rfl: 0  clindamycin (CLEOCIN T) 1 % lotion, Apply topically two (2) times a day., Disp: 30 mL, Rfl: 0    doxycycline (VIBRAMYCIN) 100 MG capsule, Take 1 capsule (100 mg total) by mouth two (2) times a day for 10 days., Disp: 20 capsule, Rfl: 0    empty container Misc, Use as directed to dispose of Humira pens., Disp: 1 each, Rfl: 2    etonogestrel (NEXPLANON) 68 mg Impl, 1 each (68 mg total) by Subdermal route once., Disp: , Rfl:     gabapentin (NEURONTIN) 250 mg/5 mL oral solution, Take 6 mL (300 mg total) by mouth every eight (8) hours for 14 days., Disp: 252 mL, Rfl: 0    HUMIRA PEN CITRATE FREE 40 MG/0.4 ML, Inject the contents of 1 pen (40 mg total) under the skin every seven (7) days., Disp: 12 each, Rfl: 3    metFORMIN (GLUCOPHAGE) 500 MG tablet, Take 1 tablet (500 mg total) by mouth in the morning and 1 tablet (500 mg total) in the evening. Take with meals., Disp: 60 tablet, Rfl: 11    multivitamin-min-iron-FA-vit K (BARIATRIC MULTIVITAMINS) 45 mg iron- 800 mcg-120 mcg cap, Take 1 tablet by mouth daily., Disp: , Rfl:     ursodioL (ACTIGALL) 300 mg capsule, Take 1 capsule (300 mg total) by mouth two (2) times a day., Disp: 60 capsule, Rfl: 5    ALLERGIES:   Patient has no known allergies.    SOCIAL HISTORY:   Social History     Tobacco Use    Smoking status: Never     Passive exposure: Never    Smokeless tobacco: Never   Substance Use Topics    Alcohol use: No     Alcohol/week: 0.0 standard drinks of alcohol       FAMILY HISTORY:  Family History   Problem Relation Age of Onset    Diabetes Mother     Hypertension Mother     Hypertension Father     Diabetes Father     Hypertension Sister     Stroke Brother     Seizures Brother     Heart attack Brother     Hypertension Brother     Cancer Maternal Uncle         bone, colon    Alzheimer's disease Paternal Uncle     Colon cancer Paternal Uncle     Heart murmur Maternal Grandmother     Diabetes Maternal Grandmother     Diabetes Maternal Grandfather     Heart murmur Maternal Grandfather     Anesthesia problems Neg Hx     Bleeding Disorder Neg Hx     Melanoma Neg Hx     Basal cell carcinoma Neg Hx     Squamous cell carcinoma Neg Hx         Radiology     No orders to display       Labs     Labs Reviewed - No data to display    Pertinent labs & imaging results that were available during my care of the patient were reviewed by me and considered in my medical decision making (see chart for details).    Please note- This chart has been created using [employer]. Chart creation errors have been sought, but may not always be located and such creation errors, especially pronoun confusion, do NOT reflect on the standard of medical care.    Anitra Lauth, MD  EM PGY-3       Curlene Labrum  Sanjuana Letters., MD  Resident  12/24/22 [phone]

## 2022-12-25 NOTE — Unmapped (Signed)
Phone is not accepting incoming calls at this time  Will send MyChart message    Thanks,  [name]

## 2022-12-28 NOTE — Unmapped (Signed)
[facility] Refill Coordination Note    Specialty Medication(s) to be Shipped:   Inflammatory Disorders: Humira    Other medication(s) to be shipped: No additional medications requested for fill at this time     Wendy Chen, DOB: 1981/10/29  Phone: [phone] (home)       All above HIPAA information was verified with patient.     Was a Nurse, learning disability used for this call? No    Completed refill call assessment today to schedule patient's medication shipment from the [facility] [phone]).  All relevant notes have been reviewed.     Specialty medication(s) and dose(s) confirmed: Regimen is correct and unchanged.   Changes to medications: Wendy Chen reports no changes at this time.  Changes to insurance: No  New side effects reported not previously addressed with a pharmacist or physician: None reported  Questions for the pharmacist: No    Confirmed patient received a Conservation officer, historic buildings and a Surveyor, mining with first shipment. The patient will receive a drug information handout for each medication shipped and additional FDA Medication Guides as required.       DISEASE/MEDICATION-SPECIFIC INFORMATION        For patients on injectable medications: Patient currently has 1 doses left.  Next injection is scheduled for 12/29/2022.    SPECIALTY MEDICATION ADHERENCE     Medication Adherence    Patient reported X missed doses in the last month: 0  Specialty Medication: Humira  Patient is on additional specialty medications: No  Informant: patient                                Were doses missed due to medication being on hold? No    Humira 40/0.4 mg/ml: 1 days of medicine on hand       REFERRAL TO PHARMACIST     Referral to the pharmacist: Not needed      [facility]     Shipping address confirmed in Epic.     Delivery Scheduled: Yes, Expected medication delivery date: 01/01/2023.     Medication will be delivered via Same Day Courier to the prescription address in Epic WAM.    Wendy Chen   [facility] Specialty Technician

## 2023-01-01 MED FILL — HUMIRA PEN CITRATE FREE 40 MG/0.4 ML: SUBCUTANEOUS | 28 days supply | Qty: 4 | Fill #5

## 2023-01-01 NOTE — Unmapped (Signed)
[facility]  Established Patient Clinic Note    Assessment/Plan:   Wendy Chen is a 42 y.o.female    Problem List Items Addressed This Visit          Unprioritized    Essential hypertension - Primary (Chronic)     Reports previously taking chlorthalidone 50 mg daily, though per chart review, prescribed 25 mg daily. Has been without medication for the past month. Will reinitiate chlorthalidone 25 mg daily, follow up 1 month.         Relevant Medications    chlorthalidone (HYGROTON) 25 MG tablet     Other Visit Diagnoses       Acute cough        Relevant Orders    RAPID INFLUENZA/RSV/COVID PCR (Completed)            Attending: Dr. Galen Manila    Subjective   Wendy Chen is a 42 y.o. female  coming to clinic today for the following issues:    Chief Complaint   Patient presents with    Follow-up     Blood pressure, refill chlorthalidone     Congestion     Congestion and shortness of breath since yesterday     HPI:    # Sickness  - Cough, runny nose, headache  - Got bad yesterday, started a few days ago  - Mucinex,   - Covid negative yesterday    # BP  - Taking chlorthalidone 25 mg daily  - Takes BP at home, getting 150 systolics  - Has been without medication for a month      I have reviewed the problem list, medications, and allergies and have updated/reconciled them if needed.    Wendy Chen  reports that she has never smoked. She has never been exposed to tobacco smoke. She has never used smokeless tobacco.  Health Maintenance   Topic Date Due    Pneumococcal Vaccine 0-64 (1 - PCV) Never done    COVID-19 Vaccine (3 - Pfizer risk series) 04/12/2020    Influenza Vaccine (1) 07/28/2022    Serum Creatinine Monitoring  10/28/2023    Potassium Monitoring  10/28/2023    HPV Cotest with Pap Smear (21-65)  12/02/2026    Pap Smear with Cotest HPV (21-65)  12/02/2026    DTaP/Tdap/Td Vaccines (4 - Td or Tdap) 02/08/2027    Lipid Screening  06/06/2027    Hepatitis C Screen  Completed       Objective VITALS: BP 138/87 Comment: average - Pulse 80  - Temp 36.6 ??C (97.8 ??F) (Temporal)  - Ht 170.2 cm (5' 7)  - Wt (!) 123.4 kg (272 lb)  - SpO2 97%  - BMI 42.60 kg/m??     Physical Exam  Vitals reviewed.   Cardiovascular:      Rate and Rhythm: Normal rate.   Pulmonary:      Effort: Pulmonary effort is normal.   Neurological:      General: No focal deficit present.   Psychiatric:         Mood and Affect: Mood normal.         Behavior: Behavior normal.         LABS/IMAGING  Office Visit on 01/03/2023   Component Date Value    SARS-CoV-2 PCR 01/03/2023 Not Detected     Influenza A 01/03/2023 Not Detected     Influenza B 01/03/2023 Not Detected     RSV 01/03/2023 Not Detected  [facility]  [place] [facility] [place] [facility]  [address], [place], [place] [address]  Telephone [phone]  Fax [phone]  [email or web address]

## 2023-01-03 ENCOUNTER — Ambulatory Visit: Admit: 2023-01-03 | Discharge: 2023-01-04 | Payer: PRIVATE HEALTH INSURANCE

## 2023-01-05 NOTE — Unmapped (Signed)
Reports previously taking chlorthalidone 50 mg daily, though per chart review, prescribed 25 mg daily. Has been without medication for the past month. Will reinitiate chlorthalidone 25 mg daily, follow up 1 month.

## 2023-01-05 NOTE — Unmapped (Signed)
Addended by: Mauri Reading R on: 01/05/2023 01:51 PM     Modules accepted: Level of Service

## 2023-01-05 NOTE — Unmapped (Signed)
I was available. I reviewed the case but did not see the patient.  I agree with the assessment and plan as documented in the resident's note. Mauri Reading, MD

## 2023-01-06 DIAGNOSIS — I10 Essential (primary) hypertension: Principal | ICD-10-CM

## 2023-01-08 DIAGNOSIS — I10 Essential (primary) hypertension: Principal | ICD-10-CM

## 2023-01-08 MED ORDER — CHLORTHALIDONE 25 MG TABLET
ORAL_TABLET | Freq: Every day | ORAL | 11 refills | 30.00000 days | Status: CP
Start: 2023-01-08 — End: 2024-01-03

## 2023-01-18 ENCOUNTER — Ambulatory Visit: Admit: 2023-01-18 | Discharge: 2023-01-18 | Payer: PRIVATE HEALTH INSURANCE

## 2023-01-18 ENCOUNTER — Ambulatory Visit
Admit: 2023-01-18 | Discharge: 2023-01-18 | Payer: PRIVATE HEALTH INSURANCE | Attending: Student in an Organized Health Care Education/Training Program | Primary: Student in an Organized Health Care Education/Training Program

## 2023-01-18 DIAGNOSIS — L732 Hidradenitis suppurativa: Principal | ICD-10-CM

## 2023-01-18 DIAGNOSIS — Z79899 Other long term (current) drug therapy: Principal | ICD-10-CM

## 2023-01-18 MED ORDER — DOXYCYCLINE HYCLATE 100 MG TABLET
ORAL_TABLET | Freq: Two times a day (BID) | ORAL | 0 refills | 30.00000 days | Status: CP
Start: 2023-01-18 — End: ?

## 2023-01-18 MED ORDER — COSENTYX 150 MG/ML SUBCUTANEOUS SYRINGE
SUBCUTANEOUS | 0 refills | 0.00000 days | Status: CP
Start: 2023-01-18 — End: 2023-02-09

## 2023-01-18 MED ORDER — COSENTYX PEN 300 MG/2 PENS (150 MG/ML) SUBCUTANEOUS
SUBCUTANEOUS | 5 refills | 28.00000 days | Status: CP
Start: 2023-01-18 — End: ?

## 2023-01-18 MED ORDER — CLINDAMYCIN 1 % LOTION
Freq: Two times a day (BID) | TOPICAL | 6 refills | 0.00000 days | Status: CP
Start: 2023-01-18 — End: 2024-01-18

## 2023-01-18 NOTE — Unmapped (Addendum)
Continue Doxycycline tabs. Make sure to take with food and full glass of water. Wait 30 minutes before laying down .    Please try over the counter cleansers.

## 2023-01-18 NOTE — Unmapped (Signed)
Dermatology Note     Assessment and Plan:        Hidradenitis Suppurativa, flaring of bilateral axillae- Hurley Stage 3- L > R chronic, flaring  - Previously tried topicals as well as Cefdinir, metformin, spironolactone as well as ILK, Punch I&D, prednisone  - Admitted to the ED on 12/23/2022 and had an ultrasound of the bilateral axillae with no obvious abscess, or tappable pocket, no fluctuance on exam. Patient was prescribed a 10-day course of doxycycline 100mg  BID and clindamycin (CLEOCIN T) 1 % lotion.   - Discussed the chronic, relapsing nature of this skin disease, characterized by recurring inflamed painful nodules with abscess and sinus formation, and scarring  - STOP on Humira 80 mg weekly  - START secukinumab (COSENTYX) 150 mg/mL Syrg; Inject 300 mg under the skin once a week for 4 doses. Inject the contents of 2 pens subcutaneously weekly for 4 weeks as loading doses. Denies any history of inflammatory bowel disease.   - START secukinumab (COSENTYX PEN, 2 PENS,) 150 mg/mL PnIj injection; Inject 2 mL (300 mg total) under the skin every twenty-eight (28) days. Maintenance dose  - START clindamycin (CLEOCIN T) 1 % lotion; Apply topically two (2) times a day.  - START doxycycline (VIBRA-TABS) 100 MG tablet; Take 1 tablet (100 mg total) by mouth two (2) times a day. SE of doxycycline reviewed including GI upset, sun sensitivity. Take with food and full glass water. Don't lie down 30 min after taking      High Risk Medication Use (Humira/cosentyx)   - repeat Quantiferon TB Gold Plus; Future  - Negative quant gold on 07/06/2022  - Discussed risks of tuberculosis, other uncommon infections, theoretical risk of malignancies, and other uncommon side effects.    The patient was advised to call for an appointment should any new, changing, or symptomatic lesions develop.     RTC: Return in about 1 month (around 02/16/2023). or sooner as needed _________________________________________________________________      Chief Complaint     Chief Complaint   Patient presents with    HS      3wks ER, gave antibiotic       HPI     Wendy Chen is a 42 y.o. female who presents as a returning patient (last seen by Wendy Chen and Wendy Chen on 08/03/2022) to [facility] for follow up of hidradenitis suppurativa. At last visit, patient was to start a 14-day course of doxycycline 100mg  BID for erythematous slightly indurated plaque.    Hidradenitis Suppurativa  -Reports she has continued to drain  -she feels humira has not been working very well for her   -she recently went to the ER and got some doxycycline which has helped a little bit.     The patient denies any other new or changing lesions or areas of concern.     Pertinent Past Medical History     Disease course:  Year when symptoms first noticed: 72  Year of diagnosis: 2019  Who diagnosed you? Dermatologist  Location of first symptoms: groin, axillae and inner thighs  Typical involved areas include: groin, axillae, inner thighs and buttocks  Typical number of inflammatory lesions each month at baseline (from first visit): 1-3  Disease triggers: stress, sweat and exercise     Are menstrual cycles irregular when not on birth control? Yes.  Current form of contraception: implant  Effect of hormonal contraception on disease: no change  Difficulty becoming pregnancy? No.  Pregnancy complications? early delivery,  pre-eclampsia and c-section  How many children? 3  Better or worse with pregnancy?  no change.  If so, worse during a specific trimester? No change    Past Medical History, Family History, Social History, Medication List, Allergies, and Problem List were reviewed in the rooming section of Epic.     ROS: Other than symptoms mentioned in the HPI, no fevers, chills, or other skin complaints    Physical Examination     GENERAL: Well-appearing female in no acute distress, resting comfortably.  NEURO: Alert and oriented, answers questions appropriately  PSYCH: Normal mood and affect  SKIN: Examination of the bilateral upper extremities was performed  -left axilla with scarring and sinus tracts     All areas not commented on are within normal limits or unremarkable      (Approved Template 08/09/2020)

## 2023-01-22 MED ORDER — COSENTYX PEN 300 MG/2 PENS (150 MG/ML) SUBCUTANEOUS
SUBCUTANEOUS | 5 refills | 28.00000 days | Status: CP
Start: 2023-01-22 — End: ?

## 2023-01-22 MED ORDER — COSENTYX 150 MG/ML SUBCUTANEOUS SYRINGE
SUBCUTANEOUS | 0 refills | 0.00000 days | Status: CP
Start: 2023-01-22 — End: 2023-02-13

## 2023-01-23 DIAGNOSIS — L732 Hidradenitis suppurativa: Principal | ICD-10-CM

## 2023-01-23 LAB — TB MITOGEN: TB MITOGEN VALUE: 10

## 2023-01-23 LAB — QUANTIFERON TB GOLD PLUS
QUANTIFERON ANTIGEN 1 MINUS NIL: 0.2 [IU]/mL
QUANTIFERON ANTIGEN 2 MINUS NIL: 0.3 [IU]/mL
QUANTIFERON MITOGEN: 9.85 [IU]/mL
QUANTIFERON TB GOLD PLUS: NEGATIVE
QUANTIFERON TB NIL VALUE: 0.15 [IU]/mL

## 2023-01-23 LAB — TB AG1: TB AG1 VALUE: 0.35

## 2023-01-23 LAB — TB AG2: TB AG2 VALUE: 0.45

## 2023-01-23 LAB — TB NIL: TB NIL VALUE: 0.15

## 2023-01-23 MED ORDER — COSENTYX PEN 150 MG/ML SUBCUTANEOUS
SUBCUTANEOUS | 6 refills | 28.00000 days | Status: CP
Start: 2023-01-23 — End: ?

## 2023-01-23 MED ORDER — COSENTYX 150 MG/ML SUBCUTANEOUS SYRINGE
SUBCUTANEOUS | 0 refills | 0.00000 days | Status: CP
Start: 2023-01-23 — End: 2023-02-14

## 2023-01-31 DIAGNOSIS — L732 Hidradenitis suppurativa: Principal | ICD-10-CM

## 2023-01-31 MED ORDER — COSENTYX PEN 150 MG/ML SUBCUTANEOUS
SUBCUTANEOUS | 6 refills | 28.00000 days
Start: 2023-01-31 — End: ?

## 2023-01-31 NOTE — Unmapped (Signed)
Duplicate request

## 2023-01-31 NOTE — Unmapped (Signed)
..  Wendy Chen has been contacted in regards to their refill of Humira. At this time, they have declined refill due to  patient will be taking Cosentyx; prescription was sent to [facility] . Refill assessment call date has been updated per the patient's request.

## 2023-02-13 ENCOUNTER — Ambulatory Visit: Admit: 2023-02-13 | Discharge: 2023-02-14 | Payer: PRIVATE HEALTH INSURANCE | Attending: Family | Primary: Family

## 2023-02-13 LAB — COMPREHENSIVE METABOLIC PANEL
ALBUMIN: 3.7 g/dL (ref 3.4–5.0)
ALKALINE PHOSPHATASE: 82 U/L (ref 46–116)
ALT (SGPT): 13 U/L (ref 10–49)
ANION GAP: 8 mmol/L (ref 5–14)
AST (SGOT): 20 U/L (ref <=34)
BILIRUBIN TOTAL: 0.6 mg/dL (ref 0.3–1.2)
BLOOD UREA NITROGEN: 10 mg/dL (ref 9–23)
BUN / CREAT RATIO: 15
CALCIUM: 9.7 mg/dL (ref 8.7–10.4)
CHLORIDE: 107 mmol/L (ref 98–107)
CO2: 25.9 mmol/L (ref 20.0–31.0)
CREATININE: 0.66 mg/dL
EGFR CKD-EPI (2021) FEMALE: >90 mL/min/{1.73_m2} (ref >=60)
GLUCOSE RANDOM: 86 mg/dL (ref 70–179)
POTASSIUM: 3.7 mmol/L (ref 3.4–4.8)
PROTEIN TOTAL: 7.5 g/dL (ref 5.7–8.2)
SODIUM: 141 mmol/L (ref 135–145)

## 2023-02-13 LAB — IRON PANEL
IRON SATURATION (CALC): 41 %
IRON: 102 ug/dL
TOTAL IRON BINDING CAPACITY (CALC): 250.7 ug/dL (ref 250.0–425.0)
TRANSFERRIN: 199 mg/dL — ABNORMAL LOW

## 2023-02-13 LAB — HEMOGLOBIN A1C
ESTIMATED AVERAGE GLUCOSE: 88 mg/dL
HEMOGLOBIN A1C: 4.7 % — ABNORMAL LOW (ref 4.8–5.6)

## 2023-02-13 LAB — CBC
HEMATOCRIT: 38.8 % (ref 34.0–44.0)
HEMOGLOBIN: 12.9 g/dL (ref 11.3–14.9)
MEAN CORPUSCULAR HEMOGLOBIN CONC: 33.3 g/dL (ref 32.0–36.0)
MEAN CORPUSCULAR HEMOGLOBIN: 28.9 pg (ref 25.9–32.4)
MEAN CORPUSCULAR VOLUME: 86.9 fL (ref 77.6–95.7)
MEAN PLATELET VOLUME: 7.4 fL (ref 6.8–10.7)
PLATELET COUNT: 367 10*9/L (ref 150–450)
RED BLOOD CELL COUNT: 4.47 10*12/L (ref 3.95–5.13)
RED CELL DISTRIBUTION WIDTH: 12.7 % (ref 12.2–15.2)
WBC ADJUSTED: 6.4 10*9/L (ref 3.6–11.2)

## 2023-02-13 LAB — FERRITIN: FERRITIN: 65.5 ng/mL

## 2023-02-13 LAB — VITAMIN B12: VITAMIN B-12: 1357 pg/mL — ABNORMAL HIGH (ref 211–911)

## 2023-02-13 MED ORDER — OMEPRAZOLE 20 MG CAPSULE,DELAYED RELEASE
ORAL_CAPSULE | Freq: Every day | ORAL | 2 refills | 30 days | Status: CP
Start: 2023-02-13 — End: ?

## 2023-02-13 NOTE — Unmapped (Addendum)
Omeprazole 20 mg a day first thing in the morning, empty stomach, 3 months  Avoid fast food  Benadryl cream to itchy incision  When able- vitamins- aim for bariatric for life   Bariatric choice capsule 1 a day    5. Resume gym attendance        41 lbs lost.  13.7% body weight  STG: by June 18th : 238-240 lbs  LTG: Goal is to lose 28% by 10/27/2023 (215#)    Post-Operative Dietary Goals  Follow a consistent meal pattern. Eat 3 small meals and 2 snacks.   Do not skip meals and do not graze.  Do not go long periods of time (more than 3-4 hr) without eating.  Eat within 1 hr of waking up.  Have protein with every meal. Aim for 60-80g daily.   Use a saucer  Eat protein first, followed by a non-starchy vegetable. Carbs/fruit last.  Drink 64 oz of water daily.  Avoid all sugary drinks (Ex: juice, soda, sweet tea)  Avoid carbonated beverages.  Avoid drinking with meals, wait 30 minutes after the meal before drinking.  May supplement w protein shakes  Take your chewable or capsule bariatric vitamins  Take Calcium Citrate chews 1200-1500 mg a day  Exercise - Aim for 30 minutes, most days of the week.  Keep a food journal.  Practice mindful eating. Eat until you're satisfied.   See Bariatric Surgery Team 3 months, 6 months, 12 months, 18 months post op  See Bariatric Medical Team year 2, 3, 4, 5

## 2023-02-13 NOTE — Unmapped (Addendum)
Bariatric Surgery Post-operative Outpatient Note                             PCP: Lilian Kapur, MD    CHIEF COMPLAINT:   Bariatric/Metabolic surgery follow up: 3 months   Postoperative follow ups: 12/19/2022 RD, 02/13/2023 NP    HISTORY OF PRESENT ILLNESS:  Wendy Chen is status post Laparoscopic Sleeve Gastrectomy performed by Surgeon Encarnacion Chu on 10/26/2022.    WEIGHT :  Weight loss has been: quick  Highest adult weight: 357 lbs  Date of Surgery weight: 298 lbs.    257 lbs today    Wt Readings from Last 12 Encounters:   02/13/23 (!) 121.7 kg (257 lb 3.2 oz)   01/03/23 (!) 123.4 kg (272 lb)   12/23/22 (!) 124.7 kg (275 lb)   12/19/22 (!) 125.1 kg (275 lb 11.2 oz)   11/14/22 (!) 128.2 kg (282 lb 9.6 oz)   10/26/22 (!) 135.4 kg (298 lb 8.1 oz)   09/29/22 (!) 139.5 kg (307 lb 8 oz)   09/01/22 (!) 141.2 kg (311 lb 3.2 oz)   08/21/22 (!) 144.2 kg (318 lb)   08/04/22 (!) 144.2 kg (318 lb)   07/13/22 (!) 144.2 kg (318 lb)   07/12/22 (!) 142.9 kg (315 lb 0.6 oz)       GI Symptoms r/t WLS:  Wendy Chen is getting acid reflux, Wendy Chen may forget to eat, and  feels acid buildup, eats crackers to calm it.  Wendy Chen gets more symptoms recently with doxycycline- 13 more days to go.   Has tried tums, no relief, water, no relief.   Wendy Chen  notes a lot of stomach noise.   Is not taking a PPI.  Food intolerances:  no  Wendy Chen gets nausea when Wendy Chen eats too much, or if Wendy Chen has the burger, from fast food, Wendy Chen vomited.   Denies post prandial pain.   Dumping syndrome: no    Gallbladder: intact, taking ursodiol.  Bowels: better now, they were constipated.  Smoothe daily now.   Last EGD: Pre-op done on 08/04/2022  NSAIDS: Avoids  Any other GI concerns: Denies    Eating habits:  Last visit with dietitian: on 12/19/2022, with Megan Salon RD  Feels Wendy Chen has been doing well with bariatric eating habits, i.e.: eating slow, avoiding drinking with meals, chewing well, being mindful, measuring portions.   Is eating 3 protein rich meals daily.  Wendy Chen likes vegetables, even more now since surgery.  Protein sources: yogurt, protein chips  Wendy Chen avoids rice and bread. It bothers her, so Wendy Chen has always avoided it.   Is drinking a lot of water. 64 oz bottle, drinks 2 a day. Hydration stick.     Hunger is low. Wendy Chen gets full quickly.   Denies head hunger.  Cravings: no   Use of antiobesity medication: No     Hx of Diabetes: No.   Lab Results   Component Value Date    A1C 4.7 (L) 02/13/2023    Meds: None. Hypoglycemia: Denies    Vitamin deficiency screening:  Brand of Vitamins: Flintstone chews complete (2 a day)  Chewable calcium Viactiv: 1 a day    Physical activity:  Exercise includes: walking a little bit, not at the gym yet, still has membership.  Wendy Chen walks up to 8k spd.     Sleep/Rest:  Wendy Chen is sleeping same, no change.   Wendy Chen has OSA and sometimes wears her cpap  Psychosocial:  Mental Health Hx/Dx: some low mood/ depression (not severe like Wendy Chen had in the past - post partum 16 years ago)  Medications: no.  Psychotherapy: no.  Fasting, binging, purging, restricting:  no.    Alcohol consumption:  no.  Smoking- nicotine/vape: no.    Integumentary-  Denies issues with Hair Loss/Thinning  Wendy Chen notes skin laxity/looseness in hands.    Reproductive/Sexual Issues/Concerns:  Nexplanon   None since 07/31/22.    Questions/Concerns:  None    RELEVANT TESTS/LAB RESULTS:  Lab Results   Component Value Date    WBC 6.4 02/13/2023    HGB 12.9 02/13/2023    HCT 38.8 02/13/2023    PLT 367 02/13/2023    CHOL 206 (H) 06/05/2022    TRIG 179 (H) 06/05/2022    HDL 52 06/05/2022    ALT 13 02/13/2023    AST 20 02/13/2023    NA 141 02/13/2023    K 3.7 02/13/2023    CL 107 02/13/2023    CREATININE 0.66 02/13/2023    BUN 10 02/13/2023    CO2 25.9 02/13/2023    TSH 1.107 06/05/2022    INR 1.12 10/26/2022    GLUF 87 02/22/2017     Lab Results   Component Value Date    VITAMINB12 1,357 (H) 02/13/2023      Lab Results   Component Value Date    A1C 4.7 (L) 02/13/2023      Lab Results   Component Value Date    IRON 102 02/13/2023 TIBC 250.7 02/13/2023    FERRITIN 65.5 02/13/2023      Lab Results   Component Value Date    PROT 7.5 02/13/2023    ALBUMIN 3.7 02/13/2023       PAST MEDICAL HISTORY  Past Medical History:   Diagnosis Date    AMA age 32 10/04/2016    We discussed the patient's age related risk of aneuploidy.  We discussed screening options including serum screening and NIPT as well as diagnostic options of CVS and amniocentesis.  We discussed risks, benefits and diagnostic capability of both CVS and amniocentesis.   -ordered genetic consult and first trimester screen  -Age 9 at [facility] -S/p genetic counseling 11/17 -Negative cfDNA -Fetal fraction 1.5% (Fetal fraction is affected by fetal aneuploidy (trisomy 13 and 18), maternal BMI, and other maternal conditions such as preexisting hypertension. In the setting of low fetal fraction (typically defined in the literature as <4%) sensitivity of cfDNA screening may be reduced Janyth Contes, 2015. Reprod Sci).      Disorder of skin or subcutaneous tissue     Heart murmur     benign, no abx prophylaxis required, 2012 echo noted trivial TR    Hidradenitis suppurativa 2018    Hypertension     Migraine     Obesity     Postpartum depression 2007, 2012    Procreative genetic counseling 10/05/2016    Genetic counseling visit on 10/09/16 Aneuploidy screening/ testing: cfDNA; results negative - low fetal fraction Carrier screening: [x]  Cystic fibrosis - neg x 106 mutationss [x]  Spinal muscular atrophy - 3 copies; reduced carrier risk [x]  Hemoglobinopathy screening - normal adult hemoglobin present      Sleep apnea     Snoring 12/08/2016       PAST SURGICAL HISTORY:  Past Surgical History:   Procedure Laterality Date    CESAREAN SECTION  2007, 2012    PR CESAREAN DELIVERY ONLY N/A 04/03/2017    Procedure: CESAREAN DELIVERY ONLY;  Surgeon:  Barb Merino, MD;  Location: L&D C-SECTION OR SUITES [facility];  Service: Family Planning    PR LAP, GAST RESTRICT PROC, LONGITUDINAL GASTRECTOMY N/A 10/26/2022 Procedure: LAPAROSCOPY, SURGICAL, GASTRIC RESTRICTIVE PROCEDURE; LONGITUDINAL GASTRECTOMY;  Surgeon: Rowe Clack, MD;  [facility];  Service: Gastrointestinal    PR REVISE MEDIAN N/CARPAL TUNNEL SURG Right 03/02/2021    Procedure: NEUROPLASTY AND/OR TRANSPOSITION; MEDIAN NERVE AT CARPAL TUNNEL;  Surgeon: Daisy Lazar, MD;  Location: ASC OR [facility];  Service: Orthopedics    PR REVISE MEDIAN N/CARPAL TUNNEL SURG Left 03/16/2021    Procedure: NEUROPLASTY AND/OR TRANSPOSITION; MEDIAN NERVE AT CARPAL TUNNEL;  Surgeon: Daisy Lazar, MD;  Location: ASC OR [facility];  Service: Orthopedics    PR TAP BLOCK BILATERAL BY INJECTION(S) Bilateral 10/26/2022    Procedure: TRANSVERSUS ABDOMINIS PLANE (TAP) BLOCK (ABDOMINAL PLANE BLOCK, RECTUS SHEATH BLOCK) BILATERAL; BY INJECTIONS (INCLUDES IMAGING GUIDANCE, WHEN PERFORMED);  Surgeon: Rowe Clack, MD;  Location: MAIN OR [facility];  Service: Gastrointestinal    PR UPPER GI ENDOSCOPY,BIOPSY N/A 08/04/2022    Procedure: UGI ENDOSCOPY; WITH BIOPSY, SINGLE OR MULTIPLE;  Surgeon: Jules Husbands, MD;  Location: GI PROCEDURES [facility];  Service: Gastroenterology    WISDOM TOOTH EXTRACTION         SOCIAL HISTORY:  Social History     Social History Narrative    Wendy Chen is from [place]    Single    Wendy Chen has 3 sons. (Ages 81, 39, 5).    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     Social History     Tobacco Use   Smoking Status Never    Passive exposure: Never   Smokeless Tobacco Never       FAMILY HISTORY:  Family History   Problem Relation Age of Onset    Diabetes Mother     Hypertension Mother     Hypertension Father     Diabetes Father     Hypertension Sister     Stroke Brother     Seizures Brother     Heart attack Brother     Hypertension Brother     Cancer Maternal Uncle         bone, colon    Alzheimer's disease Paternal Uncle     Colon cancer Paternal Uncle     Heart murmur Maternal Grandmother     Diabetes Maternal Grandmother     Diabetes Maternal Grandfather     Heart murmur Maternal Grandfather     Anesthesia problems Neg Hx     Bleeding Disorder Neg Hx     Melanoma Neg Hx     Basal cell carcinoma Neg Hx     Squamous cell carcinoma Neg Hx        MEDICATIONS:    Current Outpatient Medications:     calcium carbonate (CALCIUM 500 ORAL), Take 3 tablets by mouth daily. Bariatric Calcium Chews- 3 chews at once, Disp: , Rfl:     chlorthalidone (HYGROTON) 25 MG tablet, Take 1 tablet (25 mg total) by mouth daily., Disp: 30 tablet, Rfl: 11    clindamycin (CLEOCIN T) 1 % lotion, Apply topically two (2) times a day., Disp: 30 mL, Rfl: 0    clindamycin (CLEOCIN T) 1 % lotion, Apply topically two (2) times a day., Disp: 60 mL, Rfl: 6    doxycycline (VIBRA-TABS) 100 MG tablet, Take 1 tablet (100 mg total) by mouth two (2) times a day., Disp: 60  tablet, Rfl: 0    etonogestrel (NEXPLANON) 68 mg Impl, 1 each (68 mg total) by Subdermal route once., Disp: , Rfl:     multivitamin-min-iron-FA-vit K (BARIATRIC MULTIVITAMINS) 45 mg iron- 800 mcg-120 mcg cap, Take 1 tablet by mouth daily., Disp: , Rfl:     secukinumab (COSENTYX PEN) 150 mg/mL PnIj injection, Inject 2 mL (300 mg total) under the skin every twenty-eight (28) days. For maintenance dose, Disp: 2 mL, Rfl: 6    secukinumab (COSENTYX) 150 mg/mL Syrg, Inject 300 mg under the skin once a week for 4 doses. Inject the contents of 2 syringes subcutaneously weekly for 4 weeks as loading doses., Disp: 8 mL, Rfl: 0    ursodioL (ACTIGALL) 300 mg capsule, Take 1 capsule (300 mg total) by mouth two (2) times a day., Disp: 60 capsule, Rfl: 5    gabapentin (NEURONTIN) 250 mg/5 mL oral solution, Take 6 mL (300 mg total) by mouth every eight (8) hours for 14 days., Disp: 252 mL, Rfl: 0    omeprazole (PRILOSEC) 20 MG capsule, Take 1 capsule (20 mg total) by mouth daily., Disp: 30 capsule, Rfl: 2    ALLERGIES:  Patient has no known allergies.    REVIEW OF SYMPTOMS:  A ten-point ROS was performed and is negative except for the pertinent findings in the HPI and 3.5.    PHYSICAL EXAM:  BP 143/87 (BP Position: Sitting)  - Pulse 53  - Ht 170.2 cm (5' 7)  - Wt (!) 116.6 kg (257 lb)  - SpO2 99%  - BMI 40.25 kg/m??   Body Measurements in inches: Neck 14.75, Chest 49.5, Waist 42, Hips 52, Upper Arm, 19 Upper Thigh 25.5  Waist Hip Ratio: 0.80  Obesity/Fat Distribution: gynoid/gluteofemoral     Alert, pleasant, African-American female with obesity  Respirations regular and easy, no coughing  Abd: soft, nontender, + BS x4, no rebound tenderness, guarding  Skin: Warm and dry with laxity in upper arms and abdominal wall.     ASSESSMENT:  1. Class 3 obesity  Encounter Diagnoses   Name Primary?    S/P laparoscopic sleeve gastrectomy     Sleep apnea, obstructive     Essential hypertension     Encounter for vitamin deficiency screening     Dyspepsia Yes       Plan:  Wendy Chen is 3.5 months s/p Vertical Sleeve Gastrectomy.   Wendy Chen has lost:   41 pounds since date of surgery.  This is calculated to be a  25% excess body weight (EBW) lost.  13.7% total body weight loss (TBWL) lost.   The typical weight loss at 3-4 months post op is 14-15%, her weight loss is considered: Average.  Goal weight 215 pounds.  We reviewed her medical history, eating habits, exercise habits, sleep habits, and psychosocial status and we reviewed her progress. Adherence to bariatric dietary and lifestyle/program recommendations is: good. Areas needed for improvement: Switch to bariatric multivitamin, encouraged to start going to the gym and use her membership, avoid fast food    GI Symptoms: Worse with dietary indiscretions.  But complains of some acid reflux.  Add omeprazole 20 mg daily first thing in the morning.    Dietary Recommendations:  3.5 months post bariatric surgery lifestyle reviewed.  Advised to continue to weigh and measure portions, keep a food diary, be cognizant of labels, continue to take small bites, use a small plate, eat slowly, mindfully, and chew food well.   Avoid eating past fullness or increasing portions,  empty calories or grazing, drinking with meals long term. Encouraged to wait 30 minutes after a meal to resume drinking.  I recommended a well balanced diet: half plate full of vegetables, lean protein 60- 80 grams per day, 2 servings of fruit a day, and a small portion of grains, (similar to Mediterranean Diet- modified for size).   Only non-caloric, non-carbonated fluid intake was recommended. Sugary and carbonated beverages are contraindicated. Limit caffeine. Limit Aspartame.   Wendy Chen will follow the bariatric RD recommendations given on January visit, see note for full details.    Anti-Obesity medications: was not discussed on this visit.    Exercise Recommendations:   Wendy Chen was encouraged to exercise outside of her daily routine. Aim for at least 150-300 minutes per week, as tolerated. Daily walking is a good option to start, but should be built on to encourage long term weight loss success. This can include walking, chair exercises, bicycling, swimming, weights, bands, and resistance training as tolerated.     Healthy sleep schedule:  Encouraged to aim for 8 hours of sleep per night. Aim to go to sleep nightly at the same time, limit screen time 1 hour before bed.  Wendy Chen has a positive hx of OSA. Cpap use: Encouraged nightly use until retest.    Postop Bariatric Surgery Education:  Vertical Sleeve Gastrectomy  Benefits and results  Wendy Chen has intact gallbladder, reviewed signs and symptoms of cholelithiasis. will complete a 6 month course of Ursodiol.    Long Term Risks: pain, constipation, nausea, renal calculi, acid reflux, esophagitis, hernias, stricture/stenosis, vitamin deficiency, dehydration, dumping syndrome, ulcers, gastritis, hair thinning, skin laxity, weight regain, and need for long term bariatric follow up care  Vitamins: reviewed her choice and recommended: BARIATRIC BRAND only- MVI and Calcium (chewable or capsule), no patches or tablets  Avoidance of nicotine, blood thinners, NSAIDS.   Limitation of alcohol to reduce transference of addiction.   Short term follow up care- 3 months, 6 months, 12 months, 18 months with Bariatric Surgery Team.   Long term follow up care - Annually with Bariatric Surgery Team or Bariatric Medicine with vitamin levels   When to call SRZ team: abdominal pain, recurrent vomiting, weight gain, any concerns.   Reviewed skin laxity is common and may not be a covered or essential benefit    Recommendations/Orders/Testing:   1. Labs:  ordered, advised to go to the lab after this visit   2. Recommended to attend support group, or join/be active with online Facebook group   3. Call if heartburn, dysphagia, abd pain, weight gain  4. Formal Birth Control was advised x 18 months post op.  5.  Omeprazole 20 mg daily    Wendy Chen understands the need to see PCP for routine health care needs outside of Bariatric Surgery, including health maintenance.     Return to Clinic: 3 month(s)  Call to schedule    Skylan Lara L. Nonie Lochner MSN, FNP APRN-BC  [facility]  Bariatric Surgery  02/13/2023    See patient instructions  CC: surgeon    Counseling  Length of visit: 28 mins

## 2023-02-13 NOTE — Unmapped (Signed)
Pt's corrected wt today is 257..1lbs

## 2023-02-14 NOTE — Unmapped (Signed)
Call to pt: advised that the elevated B12.   Not concerning, as she is taking daily multivitamin. Is on Flintstones chews.  Also A1C 4.7% no concerning- this is her typical/normal.    Haywood Lasso, FNP 02/14/2023

## 2023-02-16 DIAGNOSIS — L732 Hidradenitis suppurativa: Principal | ICD-10-CM

## 2023-02-16 LAB — VITAMIN B1, WHOLE BLOOD: VITAMIN B1: 103 nmol/L

## 2023-02-16 MED ORDER — COSENTYX 150 MG/ML SUBCUTANEOUS SYRINGE
SUBCUTANEOUS | 0 refills | 0 days
Start: 2023-02-16 — End: 2023-03-10

## 2023-02-16 NOTE — Unmapped (Signed)
Update on change from Humira to Cosentyx:    Ms. Odea called stating she is having trouble getting her Cosentyx from [facility] and asked if she can receive it from the [facility].  I told her I will  submit a request to the clinic for a new Rx to be sent in so we can have our Triage team run test claims to see if a prior authorization is needed, and if copay assistance is needed and then our Dermatology  Specialty Pharmacist will reach out to onboard her to receive the medication.    Corliss Skains. [place], [place].D.  Specialty Pharmacist  [facility]  [phone] option 4 then option 2

## 2023-02-20 DIAGNOSIS — L732 Hidradenitis suppurativa: Principal | ICD-10-CM

## 2023-02-20 LAB — VITAMIN D 25 HYDROXY: VITAMIN D, TOTAL (25OH): 35.1 ng/mL (ref 20.0–80.0)

## 2023-02-20 MED ORDER — COSENTYX 150 MG/ML SUBCUTANEOUS SYRINGE
SUBCUTANEOUS | 6 refills | 0.00000 days | Status: CP
Start: 2023-02-20 — End: 2023-03-14
  Filled 2023-04-10: qty 8, 28d supply, fill #0

## 2023-02-23 NOTE — Unmapped (Signed)
[facility] Specialty Medication Onboarding    Specialty Medication: COSENTYX 150 mg/mL Syrg (secukinumab)  Prior Authorization: Approved   Financial Assistance: No - copay  <$25  Final Copay/Day Supply: $4 / 28 days (LD)          $4 / 28 days (MD)    Insurance Restrictions: None     Notes to Pharmacist:     The triage team has completed the benefits investigation and has determined that the patient is able to fill this medication at [facility]. Please contact the patient to complete the onboarding or follow up with the prescribing physician as needed.

## 2023-02-23 NOTE — Unmapped (Signed)
[facility]   Patient Onboarding/Medication Counseling    Wendy Chen is a 42 y.o. female with hidradenitis suppurativa who I am counseling today on initiation of therapy.  I am speaking to the patient.    Was a Nurse, learning disability used for this call? No    Verified patient's date of birth / HIPAA.    Specialty medication(s) to be sent: Inflammatory Disorders: Cosentyx      Non-specialty medications/supplies to be sent: na      Medications not needed at this time: na         Cosentyx (secukinumab)    Medication & Administration     Dosage: Hidradenitis Suppurativa: Inject 300mg  under the skin at weeks 0, 1, 2, 3, and 4 followed by 300 mg every 4 weeks; consider an increase to 300 mg every 2 weeks in patients who have an inadequate response      Lab tests required prior to treatment initiation:  Tuberculosis: Tuberculosis screening resulted in a non-reactive Quantiferon TB Gold assay.      Administration:     Artist all supplies needed for injection on a clean, flat working surface: medication syringe(s) removed from packaging, alcohol swab, sharps container, etc.  Look at the medication label - look for correct medication, correct dose, and check the expiration date  Look at the medication - the liquid in the syringe should appear clear and colorless to slightly yellow  Lay the syringe on a flat surface and allow it to warm up to room temperature for at least 15-30 minutes  Select injection site - you can use the front of your thigh or your belly (but not the area 2 inches around your belly button); if someone else is giving you the injection you can also use your upper arm in the skin covering your triceps muscle  Prepare injection site - wash your hands and clean the skin at the injection site with an alcohol swab and let it air dry, do not touch the injection site again before the injection  Pull off the needle safety cap, do not remove until immediately prior to injection  Pinch the skin - with your hand not holding the syringe pinch up a fold of skin at the injection site using your forefinger and thumb  Insert the needle into the fold of skin at about a 45 degree angle - it's best to use a quick dart-like motion  Push the plunger down slowly as far as it will go until the syringe is empty, if the plunger is not fully depressed the needle shield will not extend to cover the needle when it is removed, hold the syringe in place for a full 5 seconds  Check that the syringe is empty and keep pressing down on the plunger while you pull the needle out at the same angle as inserted; after the needle is removed completely from the skin, release the plunger allowing the needle shield to activate and cover the used needle  Dispose of the used syringe immediately in your sharps disposal container, do not attempt to recap the needle prior to disposing  If you see any blood at the injection site, press a cotton ball or gauze on the site and maintain pressure until the bleeding stops, do not rub the injection site      Prefilled Sensoready?? auto-injector pen  Gather all supplies needed for injection on a clean, flat working surface: medication pen removed from packaging, alcohol swab, sharps  container, etc.  Look at the medication label - look for correct medication, correct dose, and check the expiration date  Look at the medication - the liquid visible in the window on the side of the pen device should appear clear and colorless to slightly yellow  Lay the auto-injector pen on a flat surface and allow it to warm up to room temperature for at least 15-30 minutes  Select injection site - you can use the front of your thigh or your belly (but not the area 2 inches around your belly button); if someone else is giving you the injection you can also use your upper arm in the skin covering your triceps muscle  Prepare injection site - wash your hands and clean the skin at the injection site with an alcohol swab and let it air dry, do not touch the injection site again before the injection  Twist off the purple safety cap in the direction of the arrow, do not remove until immediately prior to injection and do not touch the yellow needle cover  Put the white needle cover against your skin at the injection site at a 90 degree angle, hold the pen such that you can see the clear medication window  Press down and hold the pen firmly against your skin, there will be a click when the injection starts  Continue to hold the pen firmly against your skin for about 10-15 seconds - the window will start to turn solid green  There will be a second click sound when the injection is almost complete, verify the window is solid green to indicate the injection is complete and then pull the pen away from your skin  Dispose of the used auto-injector pen immediately in your sharps disposal container the needle will be covered automatically  If you see any blood at the injection site, press a cotton ball or gauze on the site and maintain pressure until the bleeding stops, do not rub the injection site    Prefilled Unoready?? auto-injector pen  Gather all supplies needed for injection on a clean, flat working surface: medication pen removed from packaging, alcohol swab, sharps container, etc.  Look at the medication label - look for correct medication, correct dose, and check the expiration date  Look at the medication - the liquid visible in the window on the side of the pen device should appear clear and colorless to slightly yellow  Lay the auto-injector pen on a flat surface and allow it to warm up to room temperature for at least  30-45 minutes  Select injection site - you can use the front of your thigh or your belly (but not the area 2 inches around your belly button); if someone else is giving you the injection you can also use your upper arm in the skin covering your triceps muscle  Prepare injection site - wash your hands and clean the skin at the injection site with an alcohol swab and let it air dry, do not touch the injection site again before the injection  Pull off the purple safety cap in the direction of the arrow, do not remove until immediately prior to injection and do not touch the red needle cover  Put the red needle cover against your skin at the injection site at a 90 degree angle, hold the pen such that you can see the clear medication window  Press down and hold the pen firmly against your skin, there will be a click when the  injection starts  Continue to hold the pen firmly against your skin for about 10-15 seconds - the window will start to turn solid green  There will be a second click sound when the injection is almost complete, verify the window is solid green to indicate the injection is complete and then pull the pen away from your skin  Dispose of the used auto-injector pen immediately in your sharps disposal container the needle will be covered automatically  If you see any blood at the injection site, press a cotton ball or gauze on the site and maintain pressure until the bleeding stops, do not rub the injection site      Adherence/Missed dose instructions:  If your injection is given more than 4 days after your scheduled injection date - consult your pharmacist for additional instructions on how to adjust your dosing schedule.        Goals of Therapy     Ankylosing spondylitis  Relief of symptoms  Maintenance of function  Prevention of complications of spinal disease  Minimization of extraspinal and extraarticular manifestations and comorbidities  Maintenance of effective psychosocial functioning    Plaque Psoriasis  Minimize areas of skin involvement (% BSA)  Avoidance of long term glucocorticoid use  Maintenance of effective psychosocial functioning    Hidradenitis Suppurativa  Reduce the frequency and severity of new lesions  Minimize pain and suppuration  Prevent disease progression and limit scarring  Maintenance of effective psychosocial functioning    Psoriatic arthritis  Achieve remission/inactive disease or low/minimal disease activity  Maintenance of function  Minimization of systemic manifestations and comorbidities  Maintenance of effective psychosocial functioning      Side Effects & Monitoring Parameters     Injection site reaction (redness, irritation, inflammation localized to the site of administration)  Signs of a common cold - minor sore throat, runny or stuffy nose, etc.  Diarrhea    The following side effects should be reported to the provider:  Signs of a hypersensitivity reaction - rash; hives; itching; red, swollen, blistered, or peeling skin; wheezing; tightness in the chest or throat; difficulty breathing, swallowing, or talking; swelling of the mouth, face, lips, tongue, or throat; etc.  Reduced immune function - report signs of infection such as fever; chills; body aches; very bad sore throat; ear or sinus pain; cough; more sputum or change in color of sputum; pain with passing urine; wound that will not heal, etc.  Also at a slightly higher risk of some malignancies (mainly skin and blood cancers) due to this reduced immune function.  In the case of signs of infection - the patient should hold the next dose of Cosentyx?? and call your primary care provider to ensure adequate medical care.  Treatment may be resumed when infection is treated and patient is asymptomatic.  Muscle pain or weakness  Shortness of breath      Warnings, Precautions, & Contraindications     Have your bloodwork checked as you have been told by your prescriber  Talk with your doctor if you are pregnant, planning to become pregnant, or breastfeeding  Discuss the possible need for holding your dose(s) of Cosentyx?? when a planned procedure is scheduled with the prescriber as it may delay healing/recovery timeline       Drug/Food Interactions     Medication list reviewed in Epic. The patient was instructed to inform the care team before taking any new medications or supplements. No drug interactions identified.   If you have a latex  allergy use caution when handling, the needle cap of the Cosentyx?? prefilled syringe and the safety cap for the Cosentyx Sensoready?? pen contains a derivative of natural rubber latex. Unoready?? pen does NOT contain latex.   Talk with you prescriber or pharmacist before receiving any live vaccinations while taking this medication and after you stop taking it      Storage, Handling Precautions, & Disposal     Store this medication in the refrigerator.  Do not freeze  May store intact Sensoready pens and 150 mg/mL prefilled syringes at ?30??C (?86??F) for up to 4 days; may return to the refrigerator if unused  Store in original packaging, protected from light  Do not shake  Dispose of used syringes/pens in a sharps disposal container           Current Medications (including OTC/herbals), Comorbidities and Allergies     Current Outpatient Medications   Medication Sig Dispense Refill    calcium carbonate (CALCIUM 500 ORAL) Take 3 tablets by mouth daily. Bariatric Calcium Chews- 3 chews at once      chlorthalidone (HYGROTON) 25 MG tablet Take 1 tablet (25 mg total) by mouth daily. 30 tablet 11    clindamycin (CLEOCIN T) 1 % lotion Apply topically two (2) times a day. 30 mL 0    clindamycin (CLEOCIN T) 1 % lotion Apply topically two (2) times a day. 60 mL 6    doxycycline (VIBRA-TABS) 100 MG tablet Take 1 tablet (100 mg total) by mouth two (2) times a day. 60 tablet 0    etonogestrel (NEXPLANON) 68 mg Impl 1 each (68 mg total) by Subdermal route once.      gabapentin (NEURONTIN) 250 mg/5 mL oral solution Take 6 mL (300 mg total) by mouth every eight (8) hours for 14 days. 252 mL 0    multivitamin-min-iron-FA-vit K (BARIATRIC MULTIVITAMINS) 45 mg iron- 800 mcg-120 mcg cap Take 1 tablet by mouth daily.      omeprazole (PRILOSEC) 20 MG capsule Take 1 capsule (20 mg total) by mouth daily. 30 capsule 2 secukinumab (COSENTYX PEN) 150 mg/mL PnIj injection Inject 2 mL (300 mg total) under the skin every twenty-eight (28) days. For maintenance dose 2 mL 6    secukinumab (COSENTYX) 150 mg/mL Syrg Inject the contents of 2 pens (300mg ) under the skin weekly for 4 weeks as loading doses (on weeks 0,1,2,3) 8 mL 0    secukinumab (COSENTYX) 150 mg/mL Syrg Inject the contents of 2 pens (300mg ) under the skin every 28 days. 2 mL 6    ursodioL (ACTIGALL) 300 mg capsule Take 1 capsule (300 mg total) by mouth two (2) times a day. 60 capsule 5     No current facility-administered medications for this visit.       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    Appropriateness of Therapy     Acute infections noted within Epic:  No active infections  Patient reported infection: None    Is medication and dose appropriate based on diagnosis and infection status? Yes    Prescription has been clinically reviewed: Yes      Baseline Quality of Life Assessment      How many days over the past month did your HS  keep you from your normal activities? For example, brushing your teeth or getting up in the morning. Patient declined to answer    Financial Information     Medication Assistance provided: Prior Authorization    Anticipated copay of $4 reviewed with patient. Verified delivery address.    Delivery Information     Scheduled delivery date: 5/14    Expected start date: 5/14    Patient was notified of new phone menu: No    Medication will be delivered via Same Day Courier to the prescription address in [facility].  This shipment will not require a signature.      Explained the services we provide at [facility] and that each month we would call to set up refills.  Stressed importance of returning phone calls so that we could ensure they receive their medications in time each month.  Informed patient that we should be setting up refills 7-10 days prior to when they will run out of medication.  A pharmacist will reach out to perform a clinical assessment periodically.  Informed patient that a welcome packet, containing information about our pharmacy and other support services, a Notice of Privacy Practices, and a drug information handout will be sent.      The patient or caregiver noted above participated in the development of this care plan and knows that they can request review of or adjustments to the care plan at any time.      Patient or caregiver verbalized understanding of the above information as well as how to contact the pharmacy at [phone] option 4 with any questions/concerns.  The pharmacy is open Monday through Friday 8:30am-4:30pm.  A pharmacist is available 24/7 via pager to answer any clinical questions they may have.    Patient Specific Needs     Does the patient have any physical, cognitive, or cultural barriers? No    Does the patient have adequate living arrangements? (i.e. the ability to store and take their medication appropriately) Yes    Did you identify any home environmental safety or security hazards? No    Patient prefers to have medications discussed with  Patient     Is the patient or caregiver able to read and understand education materials at a high school level or above? Yes    Patient's primary language is  English Is the patient high risk? No    SOCIAL DETERMINANTS OF HEALTH     At the [facility], we have learned that life circumstances - like trouble affording food, housing, utilities, or transportation can affect the health of many of our patients.   That is why we wanted to ask: are you currently experiencing any life circumstances that are negatively impacting your health and/or quality of life? Patient declined to answer    Social Determinants  of Health     Financial Resource Strain: Low Risk  (07/07/2022)    Overall Financial Resource Strain (CARDIA)     Difficulty of Paying Living Expenses: Not very hard   Internet Connectivity: Not on file   Food Insecurity: No Food Insecurity (07/07/2022)    Hunger Vital Sign     Worried About Running Out of Food in the Last Year: Never true     Ran Out of Food in the Last Year: Never true   Tobacco Use: Low Risk  (02/13/2023)    Patient History     Smoking Tobacco Use: Never     Smokeless Tobacco Use: Never     Passive Exposure: Never   Housing/Utilities: Low Risk  (07/07/2022)    Housing/Utilities     Within the past 12 months, have you ever stayed: outside, in a car, in a tent, in an overnight shelter, or temporarily in someone else's home (i.e. couch-surfing)?: No     Are you worried about losing your housing?: No     Within the past 12 months, have you been unable to get utilities (heat, electricity) when it was really needed?: No   Alcohol Use: Not on file   Transportation Needs: No Transportation Needs (07/07/2022)    PRAPARE - Therapist, art (Medical): No     Lack of Transportation (Non-Medical): No   Substance Use: Not on file   Health Literacy: Not on file   Physical Activity: Not on file   Interpersonal Safety: Not on file   Stress: Not on file   Intimate Partner Violence: Not on file   Depression: Not at risk (12/01/2020)    PHQ-2     PHQ-2 Score: 0   Social Connections: Not on file       Would you be willing to receive help with any of the needs that you have identified today? Not applicable       Ileene Allie A Desiree Lucy [facility] Specialty Pharmacist

## 2023-04-20 ENCOUNTER — Telehealth
Admit: 2023-04-20 | Discharge: 2023-04-21 | Payer: PRIVATE HEALTH INSURANCE | Attending: Registered" | Primary: Registered"

## 2023-04-20 DIAGNOSIS — Z6838 Body mass index (BMI) 38.0-38.9, adult: Principal | ICD-10-CM

## 2023-04-20 NOTE — Unmapped (Signed)
GOALS:    Continue with a consistent meal/snack schedule              - protein every 3-4 hours * try not to consistently exceed protein goal              - aim for 2-3 servings of vegetables per day              - aim for 1-2 servings of fruit per day   - include small servings of whole grains at meals/snacks                Continue with current vitamin supplementation   - make sure to take 1200-1500mg  of calcium per day     EXERCISE: aim for some exercise out of daily routine at least 4x/wk              - increase duration/frequency/intensity as tolerated              - aim for variety in routine with a mix of cardio + strength/resistance training              - GOAL: 150-300 minutes per week

## 2023-04-20 NOTE — Unmapped (Signed)
[facility] Outpatient Nutrition Services   Post-Op Bariatric Medical Nutrition Therapy Consultation       Visit Type: Return Bariatric Nutrition Assessment    Referral Reason: bariatric weight loss surgery      Wendy Chen is a 42 y.o. female seen for medical nutrition therapy for bariatric surgery follow up.  Her medication list, labs, and typical intake were reviewed.     Surgery Date: 10/26/2022  Surgical Procedure: Laparoscopic sleeve gastrectomy   Surgeon: Dr. Yetta Flock  Pre Surgery Weight: 135.4kg (299#)    Anthropometrics   Height: 170.2 cm (5' 7.01)   Weight: (!) 112.5 kg (248 lb)  Body mass index is 38.83 kg/m??.  61.29 kg  Weight in (lb) to have BMI = 25: 159.3  Goal weight per patient: no specific goal weight/under 200#  Weight Change: -22.9kg (17%) since surgery, -12.6kg since last RD visit    Wt Readings from Last 12 Encounters:   04/20/23 (!) 112.5 kg (248 lb)   02/13/23 (!) 116.6 kg (257 lb)   01/03/23 (!) 123.4 kg (272 lb)   12/23/22 (!) 124.7 kg (275 lb)   12/19/22 (!) 125.1 kg (275 lb 11.2 oz)   11/14/22 (!) 128.2 kg (282 lb 9.6 oz)   10/26/22 (!) 135.4 kg (298 lb 8.1 oz)   09/29/22 (!) 139.5 kg (307 lb 8 oz)   09/01/22 (!) 141.2 kg (311 lb 3.2 oz)   08/21/22 (!) 144.2 kg (318 lb)   08/04/22 (!) 144.2 kg (318 lb)   07/13/22 (!) 144.2 kg (318 lb)          Nutrition Risk Screening:   Food Insecurity: No Food Insecurity (07/07/2022)    Hunger Vital Sign     Worried About Radiation protection practitioner of Food in the Last Year: Never true     Ran Out of Food in the Last Year: Never true       Nutrition Focused Physical Exam:  Nutrition Evaluation  Overall Impressions: Nutrition-Focused Physical Exam not indicated due to lack of malnutrition risk factors. (04/20/23 1144)  Nutrition Designation: Obese class II  (BMI 35.00 - 39.99 kg/m2) (04/20/23 1144)    Malnutrition Screening:   Patient does not meet AND/ASPEN criteria for malnutrition at this time (04/20/23 1144)    Biochemical Data, Medical Tests and Procedures:  All pertinent labs and imaging reviewed by Philomena Course, RD/LDN at 11:33 AM 04/20/2023.    Lab Results   Component Value Date    HGB 12.9 02/13/2023    HCT 38.8 02/13/2023    MCV 86.9 02/13/2023    IRON 102 02/13/2023    TIBC 250.7 02/13/2023    FERRITIN 65.5 02/13/2023    VITAMINB12 1,357 (H) 02/13/2023    FOLATE 21.4 06/05/2022    VITDTOTAL 35.1 02/13/2023     Lab Results   Component Value Date    ALKPHOS 82 02/13/2023    ALT 13 02/13/2023    AST 20 02/13/2023    BUN 10 02/13/2023    CREATININE 0.66 02/13/2023    GFRNONAA >=60 10/25/2017    GFRAA >=60 10/25/2017     Lab Results   Component Value Date    CHOL 206 (H) 06/05/2022    HDL 52 06/05/2022    LDL 118 (H) 06/05/2022    NONHDL 154 (H) 06/05/2022    TRIG 179 (H) 06/05/2022    A1C 4.7 (L) 02/13/2023    TSH 1.107 06/05/2022       New developments since last nutrition appointment:  Patient Updates: Patient reports that everything has been going fine.  Patient eating Regular - textured foods.  Patient is having difficulty tolerating [employer].    Physical Activity:  Physical activity level is light with some exercise.   Type: started back to the gym - had been doing some walking   Duration:    Frequency:     Medications and Vitamin/Mineral Supplementation:   All nutritionally pertinent medications reviewed on 04/20/2023.   Nutritionally pertinent medications include: omeprazole    She is taking bariatric nutrition supplements. Bariatric [employer] women's - I have misplaced my calcium chews.    Micronutrient Deficiency Evaluation     Changes in vision - denies  Difficulty seeing at night -  denies  Numbness or tingling in extremities - denies   Area   Onset  Burning in lower extremities - denies   Redness  Difficulty remembering/forgetfulness - affirms  Changes in gait or stability - denies    Current Outpatient Medications   Medication Sig Dispense Refill    calcium carbonate (CALCIUM 500 ORAL) Take 3 tablets by mouth daily. Bariatric Calcium Chews- 3 chews at once      chlorthalidone (HYGROTON) 25 MG tablet Take 1 tablet (25 mg total) by mouth daily. 30 tablet 11    clindamycin (CLEOCIN T) 1 % lotion Apply topically two (2) times a day. 30 mL 0    clindamycin (CLEOCIN T) 1 % lotion Apply topically two (2) times a day. 60 mL 6    doxycycline (VIBRA-TABS) 100 MG tablet Take 1 tablet (100 mg total) by mouth two (2) times a day. 60 tablet 0    etonogestrel (NEXPLANON) 68 mg Impl 1 each (68 mg total) by Subdermal route once.      gabapentin (NEURONTIN) 250 mg/5 mL oral solution Take 6 mL (300 mg total) by mouth every eight (8) hours for 14 days. 252 mL 0    multivitamin-min-iron-FA-vit K (BARIATRIC MULTIVITAMINS) 45 mg iron- 800 mcg-120 mcg cap Take 1 tablet by mouth daily.      omeprazole (PRILOSEC) 20 MG capsule Take 1 capsule (20 mg total) by mouth daily. 30 capsule 2    secukinumab (COSENTYX PEN) 150 mg/mL PnIj injection Inject 2 mL (300 mg total) under the skin every twenty-eight (28) days. For maintenance dose 2 mL 6    secukinumab (COSENTYX) 150 mg/mL PnIj injection Inject the contents of 2 pens (300mg ) under the skin weekly for 4 weeks as loading doses (on weeks 0,1,2,3) 8 mL 0    secukinumab (COSENTYX) 150 mg/mL Syrg Inject the contents of 2 pens (300mg ) under the skin every 28 days. 2 mL 6    ursodioL (ACTIGALL) 300 mg capsule Take 1 capsule (300 mg total) by mouth two (2) times a day. 60 capsule 5     No current facility-administered medications for this visit.       Nutrition History:     Dietary Restrictions: No known food allergies or food intolerances.   Food Aversions: pickles  Gastrointestinal Issues: denies  Dumping Syndrome: denies    Changes in Taste or Smell: sweets are sweeter  Symptoms of hypoglycemia: A couple days ago I started shaking.    Hunger and Satiety:  Patient endorsed hunger with early satiety.  Excessive Hunger: denies    24-Hour Recall/Usual Intake:  Up: 5:30-6:00am  B 8-9:00am: 1-2 eggs + sausage or maple brown sugar oatmeal + sausage  S   L 11am-12pm: 3oz chicken + greens/vegetables/salads (getting at work in [employer]  hall)  S salt and vinegar chips or 1/2 PB&J  D 9-10:00pm: 3oz pork/chicken + vegetables (green beans/carrots/corn/asparagus/squash/zucchini) + potato  HS     Food-Related History:  Estimated protein intake: I haven't been doing a good job keeping up with my protein intake.  Beverages: 50oz water, tea + Stevia  Alcohol intake: denies  Dining Out: not that often - [place] chicken wings/[employer] chicken  Cooking Methods: grill/boil/bake/air fry  Meal Schedule: meals: 3, snacks: 1     Eating Behaviors:  Cravings: Denies   Emotional Eating: Denied issues.   Grazing: Denied issues.   Nighttime Eating: None.  Fast Eating:  medium   Overeating: Denied issues with overeating.     Nutritional Needs:  Energy: 1813 kcals [Per [place] [employer]-Jeor Equation (AF: 1.0 for weight loss) using other (comment) (per patient due to virtual visit), 112.5 kg (04/20/23 1147)]  Protein: 72-87 gm [1.0-1.2 gm/kg using adjusted body weight (for a BMI: 25), 72.4 kg (04/20/23 1147)]  Carbohydrate:   [< / equal to 45% of kcal]  Fluid: 2000-3000 [ ]     Patient's actual BMR is likely lower than predicted related to significant weight loss.     Nutrition Goals & Evaluation      Continued weight loss to goal under 200# - per patient. (Ongoing and Progressing)  Avoid weight regain  (Met and Ongoing)  Meet nutritional needs (Met and Ongoing)    Nutrition goals reviewed, and relevant barriers identified and addressed: none evident. She is evaluated to have good willingness and ability to achieve nutrition goals.     Nutrition Assessment       Patient is ~6 months s/p Laparoscopic sleeve gastrectomy with Dr. Yetta Flock on 10/26/22. She has lost 22.9kg (17%) since surgery, -12.6kg since last RD visit. Patient continues to tolerate regular texture foods. The patient is currently likely meeting energy needs with current intake and would benefit from increasing protein intake to more consistently meet protein goals.      Meal Frequency: 3 meals; 1 snacks  Food Choices: Patient is focused on protein with carbohydrates coming from vegetables with limited starches.  Protein Intake: likely over 50g (goal: 60-80g)  Fluid Intake: 50oz  Exercise: Current level of exercise is not adequate to promote significant weight loss. Patient amenable to increasing exercise now that she is on a break from work.  Vitamins: Patient is taking the prescribed bariatric micronutrient supplements. Patient advised to resume calcium chews.  Labs (03/19): Did not reveal any micronutrient deficiencies for those tested. HbA1c: 4.7% indicative of periods of low blood sugar. Patient denies any current symptoms (feeling dizzy/shakiness)      Nutrition Intervention      - Nutrition Education: bariatric surgery diet/eating style. Education resources provided include: after visit summary with patient instructions     Nutrition Plan:   See Patient Instructions    Follow up will occur in November 2024 (12 months post-surgery).  Patient is encouraged to contact RD via MyChart.     Food/Nutrition-related history, Anthropometric measurements, Biochemical data, medical tests, procedures, Patient understanding or compliance with intervention and recommendations , and Effectiveness of nutrition interventions will be assessed at time of follow-up.       Recommendations for Clinical Team:  Please encourage a consistent meal/snack schedule with a focus on protein. Exercise outside of daily routine should also be encouraged for 150-300 minutes per week.        The patient reports they are physically located in [place] and is currently: at home.  I conducted a audio/video visit. I spent  33m 29s on the video call with the patient. I spent an additional 15 minutes on pre- and post-visit activities on the date of service .     I am located on-site and the patient is located off-site for this visit.

## 2023-04-26 NOTE — Unmapped (Signed)
Called patient to check in on Cosentyx. Patient reports that she has not started the medication yet as she is waiting for an infection to clear up. Will move pharmacist check-in accordingly.    Raj Janus, PharmD

## 2023-05-16 NOTE — Unmapped (Unsigned)
-Had an active infection on 5/30, started cosentyx yet 06/10. Last loading dose 05/28/2023. Refill call for maintenance dose delivery will be set for 7/25.    [facility] Clinical Assessment & Refill Coordination Note    Natalya Krenke, [place]: [birth year]  Phone: [phone] (home)     All above HIPAA information was verified with patient.     Was a Nurse, learning disability used for this call? No    Specialty Medication(s):   Inflammatory Disorders: Cosentyx     Current Outpatient Medications   Medication Sig Dispense Refill    calcium carbonate (CALCIUM 500 ORAL) Take 3 tablets by mouth daily. Bariatric Calcium Chews- 3 chews at once      chlorthalidone (HYGROTON) 25 MG tablet Take 1 tablet (25 mg total) by mouth daily. 30 tablet 11    clindamycin (CLEOCIN T) 1 % lotion Apply topically two (2) times a day. 30 mL 0    clindamycin (CLEOCIN T) 1 % lotion Apply topically two (2) times a day. 60 mL 6    doxycycline (VIBRA-TABS) 100 MG tablet Take 1 tablet (100 mg total) by mouth two (2) times a day. 60 tablet 0    etonogestrel (NEXPLANON) 68 mg Impl 1 each (68 mg total) by Subdermal route once.      gabapentin (NEURONTIN) 250 mg/5 mL oral solution Take 6 mL (300 mg total) by mouth every eight (8) hours for 14 days. 252 mL 0    multivitamin-min-iron-FA-vit K (BARIATRIC MULTIVITAMINS) 45 mg iron- 800 mcg-120 mcg cap Take 1 tablet by mouth daily.      omeprazole (PRILOSEC) 20 MG capsule Take 1 capsule (20 mg total) by mouth daily. 30 capsule 2    secukinumab (COSENTYX PEN) 150 mg/mL PnIj injection Inject 2 mL (300 mg total) under the skin every twenty-eight (28) days. For maintenance dose 2 mL 6    secukinumab (COSENTYX PEN, 2 PENS,) 150 mg/mL PnIj injection Inject the contents of 2 pens (300mg ) under the skin every 28 days. 2 mL 6     No current facility-administered medications for this visit.        Changes to medications: Kinslie reports no changes at this time.    No Known Allergies    Changes to allergies: No    SPECIALTY MEDICATION ADHERENCE     secukinumab (COSENTYX PEN) 150 mg/mL PnIj injection: 28 days of medicine on hand   Medication Adherence    Patient reported X missed doses in the last month: 0  Specialty Medication: Cosentyx  Informant: patient  Confirmed plan for next specialty medication refill: delivery by pharmacy  Refills needed for supportive medications: not needed          Specialty medication(s) dose(s) confirmed: Regimen is correct and unchanged.     Are there any concerns with adherence? No    Adherence counseling provided? Not needed    CLINICAL MANAGEMENT AND INTERVENTION      Clinical Benefit Assessment:    Do you feel the medicine is effective or helping your condition? Yes    Clinical Benefit counseling provided? Not needed    Adverse Effects Assessment:    Are you experiencing any side effects? Yes, patient reports experiencing headaches. Side effect counseling provided: OTC medication management of headaches. Reach out to the provider if it worsens.    Are you experiencing difficulty administering your medicine? No    Quality of Life Assessment:    Quality of Life    Rheumatology  Oncology  Dermatology  1. What impact has your specialty medication had on the symptoms of your skin condition (i.e. itchiness, soreness, stinging)?: Some  2. What impact has your specialty medication had on your comfort level with your skin?: Some  Cystic Fibrosis          How many days over the past month did your HS  keep you from your normal activities? For example, brushing your teeth or getting up in the morning. Patient declined to answer    Have you discussed this with your provider? Not needed    Acute Infection Status:    Acute infections noted within Epic:  No active infections  Patient reported infection: None    Therapy Appropriateness:    Is therapy appropriate and patient progressing towards therapeutic goals? Yes, therapy is appropriate and should be continued    DISEASE/MEDICATION-SPECIFIC INFORMATION      For patients on injectable medications: Patient currently has 1 doses left.  Next injection is scheduled for 05/28/2023.    Chronic Inflammatory Diseases: Have you experienced any flares in the last month? No  Has this been reported to your provider? No    PATIENT SPECIFIC NEEDS     Does the patient have any physical, cognitive, or cultural barriers? No    Is the patient high risk? No    Did the patient require a clinical intervention? No    Does the patient require physician intervention or other additional services (i.e., nutrition, smoking cessation, social work)? No    SOCIAL DETERMINANTS OF HEALTH     At the [facility], we have learned that life circumstances - like trouble affording food, housing, utilities, or transportation can affect the health of many of our patients.   That is why we wanted to ask: are you currently experiencing any life circumstances that are negatively impacting your health and/or quality of life? Patient declined to answer    Social Determinants of Health     Financial Resource Strain: Low Risk  (07/07/2022)    Overall Financial Resource Strain (CARDIA)     Difficulty of Paying Living Expenses: Not very hard   Internet Connectivity: Not on file   Food Insecurity: No Food Insecurity (07/07/2022)    Hunger Vital Sign     Worried About Running Out of Food in the Last Year: Never true     Ran Out of Food in the Last Year: Never true   Tobacco Use: Low Risk  (02/13/2023)    Patient History     Smoking Tobacco Use: Never     Smokeless Tobacco Use: Never     Passive Exposure: Never   Housing/Utilities: Low Risk  (07/07/2022)    Housing/Utilities     Within the past 12 months, have you ever stayed: outside, in a car, in a tent, in an overnight shelter, or temporarily in someone else's home (i.e. couch-surfing)?: No     Are you worried about losing your housing?: No     Within the past 12 months, have you been unable to get utilities (heat, electricity) when it was really needed?: No   Alcohol Use: Not on file   Transportation Needs: No Transportation Needs (07/07/2022)    PRAPARE - Therapist, art (Medical): No     Lack of Transportation (Non-Medical): No   Substance Use: Not on file   Health Literacy: Not on file   Physical Activity: Not on file   Interpersonal Safety: Not on file  Stress: Not on file   Intimate Partner Violence: Not on file   Depression: Not at risk (12/01/2020)    PHQ-2     PHQ-2 Score: 0   Social Connections: Not on file       Would you be willing to receive help with any of the needs that you have identified today? Not applicable       SHIPPING     Specialty Medication(s) to be Shipped:   Inflammatory Disorders: Cosentyx    Other medication(s) to be shipped: No additional medications requested for fill at this time     Changes to insurance: No    Delivery Scheduled: Patient declined refill at this time due to having 1 more loading dose remaining.     The patient will receive a drug information handout for each medication shipped and additional FDA Medication Guides as required.  Verified that patient has previously received a Conservation officer, historic buildings and a Surveyor, mining.    The patient or caregiver noted above participated in the development of this care plan and knows that they can request review of or adjustments to the care plan at any time.      All of the patient's questions and concerns have been addressed.    Elnora Morrison, PharmD   [facility] Specialty Pharmacist will receive a drug information handout for each medication shipped and additional FDA Medication Guides as required.  Verified that patient has previously received a Conservation officer, historic buildings and a Surveyor, mining.    The patient or caregiver noted above participated in the development of this care plan and knows that they can request review of or adjustments to the care plan at any time.      All of the patient's questions and concerns have been addressed.    Elnora Morrison, PharmD   [facility] Specialty Pharmacist

## 2023-06-15 DIAGNOSIS — I10 Essential (primary) hypertension: Principal | ICD-10-CM

## 2023-06-15 DIAGNOSIS — L732 Hidradenitis suppurativa: Principal | ICD-10-CM

## 2023-06-15 MED ORDER — CHLORTHALIDONE 25 MG TABLET
ORAL_TABLET | Freq: Every day | ORAL | 11 refills | 30 days
Start: 2023-06-15 — End: 2024-06-09

## 2023-06-15 MED ORDER — CLINDAMYCIN 1 % LOTION
Freq: Two times a day (BID) | TOPICAL | 6 refills | 0.00000 days | Status: CP
Start: 2023-06-15 — End: 2024-06-14

## 2023-06-15 NOTE — Unmapped (Signed)
Contacted the number provided by CMM to initiate PA request on the patient's medication clindamycin (CLEOCIN T) 1 % lotion. I spoke with a agent named Oletta Lamas and submitted a prior authorization via telephone. It is currently in medical review and will take at least 24 hours processing. The clinic will receive a fax to [phone] with the approval or denial of the medication.    Reference ID #: [id number]       Will make patient aware of PA status via MyChart message.

## 2023-06-15 NOTE — Unmapped (Signed)
Initialed PA on CMM; KEY:  [id number] Pending: Approval/Denial     Outcome  N/A today  [id number] was successfully cancelled. If you did not request the cancellation, please contact [employer] at [phone]

## 2023-06-18 NOTE — Unmapped (Signed)
[facility] Clinical Intervention    Type of intervention: Patient education    Medication involved: Cosentyx    Problem identified:   Patient has been having headaches more often and head feel like she is swimming .  Patient is unsure when this began but it has been about 1-2 weeks.  She has used all 4 loading doses, with the last dose being 7/18.     Intervention performed: Although headaches may be a side effect of Cosentyx, the incidence is rare.  Brain fog is not routinely a concern with Cosentyx.  Patient may be having another illness going on.  Next dose of Cosentyx is not for another 4 weeks (8/15) so advised patient to hold off on taking that next dose if she is not feeling better.  She will contact Derm office around 8/12 if she is not feeling better and earlier if things are getting worse.        Patient also an open wound that is draining and not healed. Advised her not to take the 8/15 dose if wound has not healed and to send a picture to Derm.       Follow-up needed: n/a    Approximate time spent: 5-10 minutes    Clinical evidence used to support intervention: Drug information resource    Result of the intervention: Prevention of an adverse drug event    Julianne Rice, PharmD   [facility] Specialty Pharmacist

## 2023-06-18 NOTE — Unmapped (Signed)
[facility] Refill Coordination Note    Specialty Medication(s) to be Shipped:   Inflammatory Disorders: Cosentyx    Other medication(s) to be shipped: No additional medications requested for fill at this time     Wendy Chen, DOB: 1981/01/04  Phone: [phone] (home)       All above HIPAA information was verified with patient.     Was a Nurse, learning disability used for this call? No    Completed refill call assessment today to schedule patient's medication shipment from the [facility] [phone]).  All relevant notes have been reviewed.     Specialty medication(s) and dose(s) confirmed: Regimen is correct and unchanged.   Changes to medications: Yissel reports no changes at this time.  Changes to insurance: No  New side effects reported not previously addressed with a pharmacist or physician: None reported  Questions for the pharmacist: No    Confirmed patient received a Conservation officer, historic buildings and a Surveyor, mining with first shipment. The patient will receive a drug information handout for each medication shipped and additional FDA Medication Guides as required.       DISEASE/MEDICATION-SPECIFIC INFORMATION        For patients on injectable medications: Patient currently has 0 doses left.  Next injection is scheduled for 8/15.    SPECIALTY MEDICATION ADHERENCE     Medication Adherence    Patient reported X missed doses in the last month: 0  Specialty Medication: secukinumab (COSENTYX PEN, 2 PENS,) 150 mg/mL PnIj injection  Patient is on additional specialty medications: No  Informant: patient              Were doses missed due to medication being on hold? No       REFERRAL TO PHARMACIST     Referral to the pharmacist: Not needed      [facility]     Shipping address confirmed in Epic.       Delivery Scheduled: Yes, Expected medication delivery date: 06/28/23.     Medication will be delivered via Same Day Courier to the prescription address in Epic WAM.    Julianne Rice, PharmD   [facility] Specialty Pharmacist

## 2023-06-19 MED ORDER — CHLORTHALIDONE 25 MG TABLET
ORAL_TABLET | Freq: Every day | ORAL | 11 refills | 30 days
Start: 2023-06-19 — End: 2024-06-13

## 2023-06-19 NOTE — Unmapped (Signed)
PA request for clindamycin lotion 1%  was denied.This decision may be appealed. Letter will be scanned into the chart under the Media Tab. Patient will be notified by Mychart or phone call.    Patient notified via MyChart message.

## 2023-06-28 MED FILL — COSENTYX PEN 300 MG/2 PENS (150 MG/ML) SUBCUTANEOUS: SUBCUTANEOUS | 28 days supply | Qty: 2 | Fill #0

## 2023-07-25 NOTE — Unmapped (Signed)
..  Wendy Chen has been contacted in regards to their refill of Cosentyx. At this time, they have declined refill due to medication being on hold Pharmacist Pooja spoke with patient previously after Patient reported having headaches. Pharmacist said to hold off on Cosentyx injections until 8/15 to see if headaches cleared up. Patient has since not had headaches, but was unsure when she should take next dose. Patient will take missed dose today with two pens she has available in fridge from 8/1 delivery. Refill assessment call date has been updated per the patient's request. Updating to 9/18. Forde Radon

## 2023-07-29 ENCOUNTER — Ambulatory Visit
Admit: 2023-07-29 | Discharge: 2023-07-29 | Disposition: A | Discharge status: Home / Self Care | Payer: PRIVATE HEALTH INSURANCE

## 2023-07-29 DIAGNOSIS — L732 Hidradenitis suppurativa: Principal | ICD-10-CM

## 2023-07-29 MED ORDER — DOXYCYCLINE HYCLATE 100 MG CAPSULE
ORAL_CAPSULE | Freq: Two times a day (BID) | ORAL | 0 refills | 10 days | Status: CP
Start: 2023-07-29 — End: 2023-08-08

## 2023-07-29 NOTE — Unmapped (Signed)
Of note, found to be bradycardic in triage rate of 46, moved to EKG

## 2023-07-29 NOTE — Unmapped (Signed)
Pt here with a reported boil that ruptured that is causing pain over c-section scar (43 year old scar)

## 2023-07-30 NOTE — Unmapped (Signed)
[facility]  Emergency Department Provider Note     ED Clinical Impression     Final diagnoses:   Hidradenitis suppurativa (Primary)      Impression, Medical Decision Making, ED Course     Impression: 42 y.o. female who has a past medical history of AMA age 58 (10/04/2016), Disorder of skin or subcutaneous tissue, Heart murmur, Hidradenitis suppurativa (2018), Hypertension, Migraine, Obesity, Postpartum depression (2007, 2012), Procreative genetic counseling (10/05/2016), Sleep apnea, and Snoring (12/08/2016). presents with advancement of hidradenitis suppurativa as described below.    DDx/MDM: Patient with known hidradenitis suppurativa pressure presents with poor control of disease after stopping prescribed antibiotics months ago.  No signs concerning for infection.  Is being seen by a dermatologist outpatient which she has not seen this year.  Will restart her doxycycline and refer her to dermatology for further management    Diagnostic workup as below. Will treat patient with doxycycline.    Orders Placed This Encounter   Procedures    ECG 12 Lead            MDM Elements                ____________________________________________    The case was discussed with the attending physician, who is in agreement with the above assessment and plan.      History     Chief Complaint  Chief Complaint   Patient presents with    Boil       HPI   Wendy Chen is a 42 y.o. female with past medical history as below who presents with worsening of her HS.    Patient complaining of nonhealing skin tears of her at hidradenitis suppurativa in the locations of her previous C-section scar and bilateral axilla.  Patient was prescribed doxycycline last year but stopped taking 6 months ago.  Denies fever, significant pain.    Outside Historian(s): I have obtained additional history/collateral from none.    Past Medical History:   Diagnosis Date    AMA age 24 10/04/2016    We discussed the patient's age related risk of aneuploidy.  We discussed screening options including serum screening and NIPT as well as diagnostic options of CVS and amniocentesis.  We discussed risks, benefits and diagnostic capability of both CVS and amniocentesis.   -ordered genetic consult and first trimester screen  -Age 47 at [facility] -S/p genetic counseling 11/17 -Negative cfDNA -Fetal fraction 1.5% (Fetal fraction is affected by fetal aneuploidy (trisomy 13 and 18), maternal BMI, and other maternal conditions such as preexisting hypertension. In the setting of low fetal fraction (typically defined in the literature as <4%) sensitivity of cfDNA screening may be reduced Janyth Contes, 2015. Reprod Sci).      Disorder of skin or subcutaneous tissue     Heart murmur     benign, no abx prophylaxis required, 2012 echo noted trivial TR    Hidradenitis suppurativa 2018    Hypertension     Migraine     Obesity     Postpartum depression 2007, 2012    Procreative genetic counseling 10/05/2016    Genetic counseling visit on 10/09/16 Aneuploidy screening/ testing: cfDNA; results negative - low fetal fraction Carrier screening: [x]  Cystic fibrosis - neg x 106 mutationss [x]  Spinal muscular atrophy - 3 copies; reduced carrier risk [x]  Hemoglobinopathy screening - normal adult hemoglobin present      Sleep apnea     Snoring 12/08/2016       Past Surgical History:  Procedure Laterality Date    CESAREAN SECTION  2007, 2012    PR CESAREAN DELIVERY ONLY N/A 04/03/2017    Procedure: CESAREAN DELIVERY ONLY;  Surgeon: Barb Merino, MD;  Location: L&D C-SECTION OR SUITES [facility];  Service: Family Planning    PR LAP, GAST RESTRICT PROC, LONGITUDINAL GASTRECTOMY N/A 10/26/2022    Procedure: LAPAROSCOPY, SURGICAL, GASTRIC RESTRICTIVE PROCEDURE; LONGITUDINAL GASTRECTOMY;  Surgeon: Rowe Clack, MD;  Location: MAIN OR [facility];  Service: Gastrointestinal    PR REVISE MEDIAN N/CARPAL TUNNEL SURG Right 03/02/2021    Procedure: NEUROPLASTY AND/OR TRANSPOSITION; MEDIAN NERVE AT CARPAL TUNNEL;  Surgeon: Daisy Lazar, MD;  Location: ASC OR [facility];  Service: Orthopedics    PR REVISE MEDIAN N/CARPAL TUNNEL SURG Left 03/16/2021    Procedure: NEUROPLASTY AND/OR TRANSPOSITION; MEDIAN NERVE AT CARPAL TUNNEL;  Surgeon: Daisy Lazar, MD;  Location: ASC OR [facility];  Service: Orthopedics    PR TAP BLOCK BILATERAL BY INJECTION(S) Bilateral 10/26/2022    Procedure: TRANSVERSUS ABDOMINIS PLANE (TAP) BLOCK (ABDOMINAL PLANE BLOCK, RECTUS SHEATH BLOCK) BILATERAL; BY INJECTIONS (INCLUDES IMAGING GUIDANCE, WHEN PERFORMED);  Surgeon: Rowe Clack, MD;  Location: MAIN OR [facility];  Service: Gastrointestinal    PR UPPER GI ENDOSCOPY,BIOPSY N/A 08/04/2022    Procedure: UGI ENDOSCOPY; WITH BIOPSY, SINGLE OR MULTIPLE;  Surgeon: Jules Husbands, MD;  Location: GI PROC[facility]URES [facility];  Service: Gastroenterology    WISDOM TOOTH EXTRACTION         No current facility-administered medications for this encounter.    Current Outpatient Medications:     calcium carbonate (CALCIUM 500 ORAL), Take 3 tablets by mouth daily. Bariatric Calcium Chews- 3 chews at once, Disp: , Rfl:     chlorthalidone (HYGROTON) 25 MG tablet, Take 1 tablet (25 mg total) by mouth daily., Disp: 30 tablet, Rfl: 11    clindamycin (CLEOCIN T) 1 % lotion, Apply topically two (2) times a day., Disp: 30 mL, Rfl: 0    clindamycin (CLEOCIN T) 1 % lotion, Apply topically two (2) times a day., Disp: 60 mL, Rfl: 6    doxycycline (VIBRA-TABS) 100 MG tablet, Take 1 tablet (100 mg total) by mouth two (2) times a day., Disp: 60 tablet, Rfl: 0    etonogestrel (NEXPLANON) 68 mg Impl, 1 each (68 mg total) by Subdermal route once., Disp: , Rfl:     gabapentin (NEURONTIN) 250 mg/5 mL oral solution, Take 6 mL (300 mg total) by mouth every eight (8) hours for 14 days., Disp: 252 mL, Rfl: 0    multivitamin-min-iron-FA-vit K (BARIATRIC MULTIVITAMINS) 45 mg iron- 800 mcg-120 mcg cap, Take 1 tablet by mouth daily., Disp: , Rfl:     omeprazole (PRILOSEC) 20 MG capsule, Take 1 capsule (20 mg total) by mouth daily., Disp: 30 capsule, Rfl: 2    secukinumab (COSENTYX PEN) 150 mg/mL PnIj injection, Inject 2 mL (300 mg total) under the skin every twenty-eight (28) days. For maintenance dose, Disp: 2 mL, Rfl: 6    secukinumab (COSENTYX PEN, 2 PENS,) 150 mg/mL PnIj injection, Inject the contents of 2 pens (300mg ) under the skin every 28 days., Disp: 2 mL, Rfl: 6    Allergies  Patient has no known allergies.    Family History  Family History   Problem Relation Age of Onset    Diabetes Mother     Hypertension Mother     Hypertension Father     Diabetes Father     Hypertension Sister     Stroke  Brother     Seizures Brother     Heart attack Brother     Hypertension Brother     Cancer Maternal Uncle         bone, colon    Alzheimer's disease Paternal Uncle     Colon cancer Paternal Uncle     Heart murmur Maternal Grandmother     Diabetes Maternal Grandmother     Diabetes Maternal Grandfather     Heart murmur Maternal Grandfather     Anesthesia problems Neg Hx     Bleeding Disorder Neg Hx     Melanoma Neg Hx     Basal cell carcinoma Neg Hx     Squamous cell carcinoma Neg Hx        Social History  Social History     Tobacco Use    Smoking status: Never     Passive exposure: Never    Smokeless tobacco: Never   Vaping Use    Vaping status: Never Used   Substance Use Topics    Alcohol use: No     Alcohol/week: 0.0 standard drinks of alcohol    Drug use: No        Physical Exam     VITAL SIGNS:      Vitals:    07/29/23 1542 07/29/23 1546   BP:  153/90   Pulse: 51 (!) 46   Resp:  18   Temp:  37.4 ??C (99.3 ??F)   TempSrc:  Oral   SpO2: 100%    Weight:  (!) 112.5 kg (248 lb)       Constitutional: Alert and oriented. No acute distress.  Eyes: Conjunctivae are normal.  HEENT: Normocephalic and atraumatic. Conjunctivae clear. No congestion. Moist mucous membranes.   Cardiovascular: Rate as above, regular rhythm. Normal and symmetric distal pulses. Brisk capillary refill. Normal skin turgor.  Respiratory: Normal respiratory effort. Breath sounds are normal. There are no wheezing or crackles heard.  Gastrointestinal: Soft, non-distended, non-tender.  Genitourinary: Deferred.  Musculoskeletal: Non-tender with normal range of motion in all extremities.  Neurologic: Normal speech and language. No gross focal neurologic deficits are appreciated. Patient is moving all extremities equally, face is symmetric at rest and with speech.  Skin: Skin is warm, dry.  Homero Fellers regions of hidradenitis suppurativa present in bilateral axilla and region of C-section scar.  Axilla with subcentimeter skin tears and weeping but no erythema or warmth, C-section region with 4 cm region of skin tear granulation tissue and no erythema/warmth.  Psychiatric: Mood and affect are normal. Speech and behavior are normal.     Radiology     No orders to display       Pertinent labs & imaging results that were available during my care of the patient were independently interpreted by me and considered in my medical decision making (see chart for details).    Portions of this record have been created using Scientist, clinical (histocompatibility and immunogenetics). Dictation errors have been sought, but may not have been identified and corrected.         Marlinda Mike, MD  Resident  07/30/23 [phone]

## 2023-08-23 NOTE — Unmapped (Signed)
The [facility] has made a second and final attempt to reach this patient to refill the following medication:COsentyx.      We have left voicemails on the following phone numbers: [phone] and have sent a text message to the following phone numbers: [phone] .    Dates contacted: 08/14/2023   08/23/2023  Last scheduled delivery: 06/29/2023    The patient may be at risk of non-compliance with this medication. The patient should call the [facility] at [phone]  Option 4, then Option 2: Dermatology, Gastroenterology, Rheumatology to refill medication.    Wendy Chen

## 2023-08-30 ENCOUNTER — Ambulatory Visit
Admit: 2023-08-30 | Discharge: 2023-08-31 | Payer: PRIVATE HEALTH INSURANCE | Attending: Student in an Organized Health Care Education/Training Program | Primary: Student in an Organized Health Care Education/Training Program

## 2023-08-30 DIAGNOSIS — L732 Hidradenitis suppurativa: Principal | ICD-10-CM

## 2023-08-30 DIAGNOSIS — L0390 Cellulitis, unspecified: Principal | ICD-10-CM

## 2023-08-30 MED ORDER — CLINDAMYCIN PHOSPHATE 1 % TOPICAL SWAB
Freq: Two times a day (BID) | TOPICAL | 6 refills | 0.00000 days | Status: CP
Start: 2023-08-30 — End: ?

## 2023-08-30 MED ORDER — DOXYCYCLINE MONOHYDRATE 100 MG CAPSULE
ORAL_CAPSULE | Freq: Two times a day (BID) | ORAL | 2 refills | 30.00000 days | Status: CP
Start: 2023-08-30 — End: ?

## 2023-08-30 NOTE — Unmapped (Signed)
[facility] releases most results to you as soon as they are available. Therefore, you may see some results before we do. Please give [place] 3 business days to review the tests and contact you by phone or through MyChart. If you are concerned that some results may be upsetting or confusing, you may wish to wait until we contact you before looking at the report in MyChart.   If you have an urgent question, you can call our clinic. MyChart should not be used for urgent issues. Otherwise, we prefer that you wait 3 business days for [place] to contact you.    [facility] Dermatology Clinical Team

## 2023-08-30 NOTE — Unmapped (Signed)
Dermatology Note     Assessment and Plan:    Hidradenitis Suppurativa, flaring of bilateral axillae, abdominal fold, perianal- Hurley Stage 3- L > R with surrounding cellulitis in L axilla chronic, flaring  - Previously tried topicals as well as Cefdinir, metformin, spironolactone as well as ILK, Punch I&D, prednisone, humira, cosentyx   - Reports worsening with weight loss  - Discussed the chronic, relapsing nature of this skin disease, characterized by recurring inflamed painful nodules with abscess and sinus formation, and scarring  -Will reach out to Dr. Janyth Contes for potential Hidratenitis suppurativa trials.   - patient reports she is having chills, night sweats for a day with associated increased drainage from bilateral axillae however no fevers at this time.   - she stopped cosentyx when she saw the ED in September, will hold off of for now because she has not had much improvement with this and she could potentially get invol  - Stop secukinumab (COSENTYX PEN, 2 PENS,) 150 mg/mL PnIj injection; Inject 2 mL (300 mg total) under the skin every twenty-eight (28) days. Maintenance dose  -Patient was denied clindamycin lotion, Start clindamycin (CLEOCIN T) 1 % topical swabs; Apply topically two (2) times a day.  - Restart doxycycline (VIBRA-TABS) 100 MG tablet; Take 1 tablet (100 mg total) by mouth two (2) times a day. SE of doxycycline reviewed including GI upset, sun sensitivity. Take with food and full glass water. Don't lie down 30 min after taking  -May need to do a short course of steroids as well once we get inflammation under control - patient to message [place] in a week if she does not have much improvement.   -Discussed that if patient has fevers, chills, is feeling worse, is having increased pain or drainage, or feels generally unwell, she should go to the ED for evaluation of systemic infection and possible need for IV antibiotics.      High Risk Medication Use (Humira/cosentyx)   - Negative quant gold on 12/2022  - Discussed risks of tuberculosis, other uncommon infections, theoretical risk of malignancies, and other uncommon side effects.      The patient was advised to call for an appointment should any new, changing, or symptomatic lesions develop.       RTC: Return in about 2 months (around 10/30/2023). or sooner as needed   _________________________________________________________________      Chief Complaint     Chief Complaint   Patient presents with    Skin Check     Open wound on abdomen from boil; will not heal. Present for about 2/3 months. Went to ER and was given Doxycycline.     Inflammation from boil on chest/L breast, area went numb. Boil popped last night - yellow/green in color. Present for about 1 week, skin is red over area and is spreading       HPI     Wendy Chen is a 42 y.o. female who presents as a returning patient (last seen 01/18/2023) to Dermatology for follow up of HS.    Today they report:  -They stopped the cosentyx because the ED had them stop it   - Had a 10 day course of doxycycline from the ED which had helped   -She responded well to the doxycycline but has had increased drainage since it has been discontinued  - The clindamycin was not covered by insurance.    Pertinent Past Medical History       Problem List  Musculoskeletal and Integument    Hidradenitis suppurativa - Primary (Chronic)    Relevant Medications    doxycycline (MONODOX) 100 MG capsule    clindamycin (CLEOCIN T) 1 % Swab         Past Medical History, Family History, Social History, Medication List, Allergies, and Problem List were reviewed in the rooming section of Epic.     ROS: Other than symptoms mentioned in the HPI, no fevers, chill, or other skin complaints.     Physical Examination     GENERAL: Well-appearing female in no acute distress, resting comfortably.  NEURO: Alert and oriented, answers questions appropriately  SKIN (Focal Skin Exam): Per patient request, examination of axillae, abdomen, buttocks was performed  - >15 Firm, erythematous papulonodules and cysts with drainage and sinus tracts in bilateral axillae, abdomen, perianal area with surrounding indurated erythema at L axilla.                   All areas not commented on are within normal limits or unremarkable      (Approved Template 08/09/2020)

## 2023-09-11 DIAGNOSIS — L732 Hidradenitis suppurativa: Principal | ICD-10-CM

## 2023-09-11 MED ORDER — PREDNISONE 10 MG TABLET
ORAL_TABLET | ORAL | 0 refills | 35.00000 days | Status: CP
Start: 2023-09-11 — End: 2023-10-16

## 2023-09-11 MED ORDER — PANTOPRAZOLE 40 MG TABLET,DELAYED RELEASE
ORAL_TABLET | Freq: Every day | ORAL | 0 refills | 45.00000 days | Status: CP
Start: 2023-09-11 — End: 2024-09-10

## 2023-09-11 NOTE — Unmapped (Signed)
Given continued flare of axillary HIDRATENITIS SUPPURATIVA, prescribed a 5 week prednisone taper, PPI, and calcium/Vitamin D.

## 2023-11-10 NOTE — Unmapped (Signed)
ASSESSMENT/PLAN:    Problem List Items Addressed This Visit       Encounter for Nexplanon removal - Primary     Other Visit Diagnoses         Encounter for medication review        Reviewed & updated medication list      Encounter for counseling regarding contraception          Vaccine counseling        Advised to be up to date on all vaccinations      Cervical cancer screening        Due in 2028          [facility] Department of Family Medicine   Implant Removal Note    Subjective     Wendy Chen is a 42 y.o. female [phone] who presents to clinic for Nexplanon implant removal secondary to patient preference. Device was placed on August 2023 at [facility]. Patient desires condoms for future contraception.      HISTORY:  Medical history, surgical history, obstetric history, medications, allergies, and social history reviewed and updated in Epic as appropriate.     ROS negative except as listed in HPI.     Objective     Physical Examination:    BP 133/81 (BP Site: R Arm)  - Pulse 62  - Temp 37 ??C (98.6 ??F) (Oral)  - Resp 14  - Ht 170.2 cm (5' 7)  - Wt (!) 106.5 kg (234 lb 12.8 oz)  - BMI 36.77 kg/m??   Gen: well appearing, no acute distress  Ext: rod is palpated on inner aspect of left arm    Labs/Studies:    No results found for this visit on 11/12/23.    Nexplanon Removal Procedure Note and Plan     Indication: Patient preference    Attending Provider:  Modesto Charon     Pre-operative Diagnosis: Contraceptive Implant Removal  Post-operative Diagnosis: same    Allergies to anesthetics/antiseptics:  None     Consent:    Counseling for removal of Nexplanon was performed and informed consent was obtained. Written consent signed and scanned into chart. The following were reviewed:   Risks of removal (bleeding, infection, scarring, damage to adjacent structures, cessation of contraception)  Benefits of removal (restoration of fertility, device removal)  Alternatives (ie other contraceptive methods)    Time-out:  Performed immediately prior to procedure      Procedure Details:  The patient was placed in supine position with her left arm flexed at the elbow and externally rotated. The area was cleansed with chlorhexidine. The tip of the implant was palpated and 3 cc of 1% lidocaine with epinephrine was injected inferior to the distal tip. Using a 10 blade scalpel, a 1 mm longitudinal incision was made in the arm parallel to the distal end of the device. The implant was pushed forward into the incision and removed using a hemostat. Once removed, confirmed total length of device at 4 cm. The skin was reapproximated using steristrips and a pressure dressing was applied. The patient tolerated the procedure well, without complications.    Post-operative Instructions and Plan:   Patient instructed to remove pressure dressing after 24 hrs, and small bandage over removal site after 3 days.  Patient instructed to observe the site for bleeding, discharge, redness, or swelling and call if any of these occur.   Recommend starting prenatal vitamin and optimizing health for pregnancy if pregnancy is desired at this time.  Recommend starting prenatal vitamin and counseled on alternative forms of contraception if not desiring pregnancy at this time.       Checklist for all devices:   Removed from medication list yes  Aftercare sheet given to patient yes    Billing and Coding:  Z30.8 Encounter for Nexplanon Removal  No LOS (unless additional clinical concerns are addressed at this visit, then choose appropriate E/M code and add 25 modifer for significant, separate procedure)  Charge Capture: [phone]     Chief Complaint   Patient presents with    OTHER     Nexplanon remopval       SUBJECTIVE:    Wendy Chen is a 42 y.o. female with depression, obesity, hypertension & headaches that presented to clinic today regarding the following issues:    Nexplanon removal  Placed in [facility] in August 2023  Left arm  Had it is for weight loss surgery due to birth control requirement  Not sexually active    ROS:  No fevers    HISTORY:  I have reviewed the patients problem list, current medications, allergies, and social history and updated them as needed.    Health Maintenance   Topic Date Due    COVID-19 Vaccine (3 - 2024-25 season) 07/29/2023    Influenza Vaccine (1) 07/29/2023    Serum Creatinine Monitoring  02/13/2024    Potassium Monitoring  02/13/2024    Mammogram  06/12/2024    HPV Cotest with Pap Smear (21-65)  12/02/2026    Pap Smear with Cotest HPV (21-65)  12/02/2026    DTaP/Tdap/Td Vaccines (4 - Td or Tdap) 02/08/2027    Lipid Screening  06/06/2027    Hepatitis C Screen  Completed    Pneumococcal Vaccine 0-64  Aged Out       Wendy Chen  reports that she has never smoked. She has never been exposed to tobacco smoke. She has never used smokeless tobacco.    OBJECTIVE:    BP 133/81 (BP Site: R Arm)  - Pulse 62  - Temp 37 ??C (98.6 ??F) (Oral)  - Resp 14  - Ht 170.2 cm (5' 7)  - Wt (!) 106.5 kg (234 lb 12.8 oz)  - BMI 36.77 kg/m??     Physical Exam  Vitals and nursing note reviewed.   Constitutional:       General: She is awake. She is not in acute distress.     Appearance: Normal appearance. She is obese. She is not ill-appearing, toxic-appearing or diaphoretic.   HENT:      Head: Normocephalic and atraumatic.      Nose: Nose normal.   Eyes:      General: No scleral icterus.        Right eye: No discharge.         Left eye: No discharge.      Conjunctiva/sclera: Conjunctivae normal.   Cardiovascular:      Comments: No cyanosis perioral  Pulmonary:      Effort: Pulmonary effort is normal. No respiratory distress.   Musculoskeletal:      Cervical back: Normal range of motion.   Skin:     Coloration: Skin is not cyanotic, jaundiced or pale.      Comments: Rod palpated in left upper arm   Neurological:      General: No focal deficit present.      Mental Status: She is alert and oriented to person, place, and time. Mental status is at baseline.  Gait: Gait normal. Psychiatric:         Attention and Perception: Attention normal.         Mood and Affect: Mood normal.         Speech: Speech normal.         Behavior: Behavior normal. Behavior is cooperative.         Thought Content: Thought content normal.         Cognition and Memory: Cognition normal.         Judgment: Judgment normal.          Clinic Visit Conclusion:  The patient (and/or caregiver) was given time to ask questions & express concerns.  The assessment & plan were reviewed with the patient, who was given the opportunity to offer suggestions & changes.  All questions & concerns were answered & addressed. The patient expressed verbal understanding & agreement with the plan as written.  The patient expressed satisfaction with the visit & had no further needs upon the completion of the visit.     [facility]  [facility]  [id number] [place], [place] [address]  Telephone [phone]  Fax [phone]  [email or web address]

## 2023-11-12 ENCOUNTER — Ambulatory Visit: Admit: 2023-11-12 | Discharge: 2023-11-13 | Payer: PRIVATE HEALTH INSURANCE

## 2023-11-12 NOTE — Unmapped (Signed)
Nexplanon removed today  Watch for signs of infection  Follow up as needed    Thank you for choosing [facility] at [facility] for your care.    If you need to get a non urgent message to me:   Please go to [email or web address] and sign in to your [facility] Chart.  If you do not have a [facility] Chart  account, we can assist you in getting one.  Please allow up to 72 business hours for responses to My [facility] chart messages. This is NOT for URGENT needs.    If you need to schedule an appointment:     Schedule through [facility] Chart OR call us at [phone]    If you need to request medication refills:  Please request a refill via [facility] Chart OR have your pharmacy send a request electronically. Do not call the clinic to request a refill.    Non-urgent medical issues:  If you are experiencing one of the issues below and would like to be evaluated and treated from the convenience of home or work, you are welcome to complete the E-visit questionnaire. E-visits are answered by our providers the same business day. E-visits are only answered on Monday- Friday between the hours of  8am-5pm.        Allergies, Asthma (unless there is trouble breathing), Back Pain, Constipation/Diarrhea/Vomiting ,Cough/Cold symptoms/Ear Pain/Sore throat/Sinus Issues, Headache, Insect bite/Rash, Pink eye, Female Urinary problems/UTI, Vaginal discharge/Irritation    How to Access the E-visit:   Sign into [facility] Chart , then click the Menu button and choose E-visits. Complete the E-Visit Questionnaire for appropriate reason.   The E-visit has average cost of $35 and are covered by some insurance plans. You will not be charged if the E-visit is not completed or if you are asked to come into the office for an appointment.    Urgent medical issue after normal business hours, on weekends, or during holidays:    [facility] at [facility]  at [address] in [place] provides extended hours on evenings and weekends. Click here for latest info.    Dr. Letitia Caul   Phone: [phone] I Fax: [phone]

## 2023-11-16 ENCOUNTER — Ambulatory Visit: Admit: 2023-11-16 | Payer: PRIVATE HEALTH INSURANCE | Attending: Registered" | Primary: Registered"

## 2023-11-16 NOTE — Unmapped (Signed)
Brief RD Telephone Note:    RD attempted to reach patient for scheduled virtual bariatric 1 year post op visit. Patient did not enter the virtual room. Patient answered attempted phone call and stated that her sister was just in an accident and would need to reschedule. RD will message the scheduling team to get patient rescheduled.

## 2023-12-13 ENCOUNTER — Ambulatory Visit
Admit: 2023-12-13 | Discharge: 2023-12-14 | Payer: PRIVATE HEALTH INSURANCE | Attending: Student in an Organized Health Care Education/Training Program | Primary: Student in an Organized Health Care Education/Training Program

## 2023-12-13 DIAGNOSIS — Z79899 Other long term (current) drug therapy: Principal | ICD-10-CM

## 2023-12-13 DIAGNOSIS — L732 Hidradenitis suppurativa: Principal | ICD-10-CM

## 2023-12-13 MED ORDER — BIMZELX AUTOINJECTOR 160 MG/ML SUBCUTANEOUS AUTO-INJECTOR
6 refills | 0.00 days | Status: CP
Start: 2023-12-13 — End: ?

## 2023-12-13 MED ADMIN — triamcinolone acetonide (KENALOG-40) injection 40 mg: 40 mg | @ 20:00:00 | Stop: 2023-12-13

## 2023-12-13 NOTE — Unmapped (Signed)
Dermatology Note     Assessment and Plan:    Hidradenitis Suppurativa, Hurley Stage III, Chronic, Flaring   - Patient suffering with significant pain and drainage, has previously tried and failed multiple antibiotics, Humira, Cosentyx, and is unable to go for infusions regularly.   - Joint decision made to proceed with switching to Bimzelx 320-mg, given as 2 separate 160-mg injections under the skin every 2 weeks for the first 16 weeks followed by every 4 weeks thereafter.  Labs as below  - Recommend continuing doxycycline 100 mg BID PRN for flares  - Will proceed with intra-lesional kenalog as below    Intralesional Kenalog Procedure Note: After the patient was informed of risks (including atrophy and dyspigmentation), benefits and side effects of intralesional steroid injection, the patient elected to undergo injection and verbal consent was obtained. Skin was cleaned with alcohol and injected intralesionally into the sites (below). The patient tolerated the procedure well without complications and was instructed on post-procedure care.  Location(s): bilateral axillae  Number of sites treated: >7  Kenalog (triamcinolone) Concentration: 40 mg/ml   Volume: 1 ml total    High Risk Medication (In Anticipation of Starting Bimzelx)  - Quant gold negative Feb 2024, ordered repeat today  - Hepatitis serologies negative 2022  - CBC and CMP wnl March 2024      The patient was advised to call for an appointment should any new, changing, or symptomatic lesions develop.     RTC: Return in about 3 months (around 03/12/2024). or sooner as needed   _________________________________________________________________      Chief Complaint     Chief Complaint   Patient presents with    HS     Painful boils        HPI     Wendy Chen is a 43 y.o. female who presents as a returning patient (last seen 08/30/2023) to Dermatology for follow up of HS. At last visit, patient stopped cosentyx, restarted doxycycline    Today patient reports the following:-   - doxycycline is not controlling HS  - Drainage and pain getting worse  - Has previously tried and failed Humira, Cosentyz  - Worst areas today for her are under arms and abdomen  - No hx of IBD    The patient denies any other new or changing lesions or areas of concern.     Pertinent Past Medical History     Past Medical History, Family History, Social History, Medication List, Allergies, and Problem List were reviewed in the rooming section of Epic.     ROS: Other than symptoms mentioned in the HPI, no fevers, chills, or other skin complaints    Physical Examination     GENERAL: Well-appearing female in no acute distress, resting comfortably.  NEURO: Alert and oriented, answers questions appropriately  PSYCH: Normal mood and affect  RESP: No increased work of breathing  SKIN (Focal Skin Exam): Per patient request, examination of axillae, abdomen was performed  - Multiple scarring tracts with inflammatory nodules and draining nodules on bilateral axillae and infrapannus with cribriform scarring    All areas not commented on are within normal limits or unremarkable      (Approved Template 08/09/2020)

## 2023-12-18 LAB — TB NIL: TB NIL VALUE: 0.08

## 2023-12-18 LAB — QUANTIFERON TB GOLD PLUS
QUANTIFERON ANTIGEN 1 MINUS NIL: 0.02 [IU]/mL
QUANTIFERON ANTIGEN 2 MINUS NIL: 0 [IU]/mL
QUANTIFERON MITOGEN: 9.92 [IU]/mL
QUANTIFERON TB GOLD PLUS: NEGATIVE
QUANTIFERON TB NIL VALUE: 0.08 [IU]/mL

## 2023-12-18 LAB — TB MITOGEN: TB MITOGEN VALUE: 10

## 2023-12-18 LAB — TB AG2: TB AG2 VALUE: 0.08

## 2023-12-18 LAB — TB AG1: TB AG1 VALUE: 0.1

## 2023-12-19 DIAGNOSIS — L732 Hidradenitis suppurativa: Principal | ICD-10-CM

## 2023-12-19 MED ORDER — BIMZELX AUTOINJECTOR 160 MG/ML SUBCUTANEOUS AUTO-INJECTOR
6 refills | 0.00 days | Status: CP
Start: 2023-12-19 — End: ?
  Filled 2024-01-25: qty 4, 28d supply, fill #0

## 2023-12-26 DIAGNOSIS — L732 Hidradenitis suppurativa: Principal | ICD-10-CM

## 2024-01-13 NOTE — Unmapped (Signed)
 Dermatology Note    Assessment and Plan:      Hidradenitis Suppurativa, Hurley Stage III, Chronic, Flaring   - Patient suffering with significant pain and drainage, has previously tried and failed multiple antibiotics, Humira, Cosentyx, and is unable to go for infusions regularly.   - Working on switching to Bimzelx 320-mg, given as 2 separate 160-mg injections under the skin every 2 weeks for the first 16 weeks followed by every 4 weeks thereafter.  Labs as below Will check with pharmacy on status  Will change from doxycycline to Augmentin bid      High Risk Medication (In Anticipation of Starting Bimzelx)  - Quant gold negative Jan 2025,   - Hepatitis serologies negative 2022  - CBC and CMP wnl March 2024    RTC: No follow-ups on file. or sooner as needed   _________________________________________________________________      Chief Complaint     No chief complaint on file.      HPI     Wendy Chen is a 43 y.o. female who presents as a returning patient (last seen by Dr. Alona Bene and Dr. Bennie Hind on 12/13/2023) for follow up of hidradenitis suppurativa. At last visit, patient received ILK to >7 lesions and was to switch to Bimzelx 320 mg for HS. Has had a delay getting Bimzelx approved so still flaring today.        Pertinent Past Medical History       Past Medical History, Family History, Social History, Medication List, Allergies, and Problem List were reviewed in the rooming section of Epic.     ROS: Other than symptoms mentioned in the HPI, no fevers, chills, or other skin complaints    Physical Examination     Gen: Well-appearing patient, appropriate, interactive, in no acute distress  Neuro: Alert and oriented, answers questions appropriately  SKIN: Examination of the bilateral axillae, chest, abdomen, groin, and buttocks was performed    location Abscess Inflamed nodule Non-inflamed nodule Draining sinus Non-draining Sinus Hurley % scar   R axilla  6  5      L axilla  7  5      R inframammary L inframammary          Intermammary          Pubic          R inguinal  3  2      R thigh          L inguinal  2  1 1      L thigh          Scrotum/Vulva          Perianal          R buttock          L buttock          Other (list)          abdomen  3  1 2        AN count (total sum of abscess and inflammatory nodule): 21    - All sites not commented on demonstrate normal findings.

## 2024-01-14 ENCOUNTER — Ambulatory Visit: Admit: 2024-01-14 | Discharge: 2024-01-15 | Payer: PRIVATE HEALTH INSURANCE

## 2024-01-14 DIAGNOSIS — L732 Hidradenitis suppurativa: Principal | ICD-10-CM

## 2024-01-14 DIAGNOSIS — Z79899 Other long term (current) drug therapy: Principal | ICD-10-CM

## 2024-01-14 MED ORDER — AMOXICILLIN 875 MG TABLET
ORAL_TABLET | Freq: Two times a day (BID) | ORAL | 2 refills | 30.00 days | Status: CN
Start: 2024-01-14 — End: ?

## 2024-01-14 MED ORDER — AMOXICILLIN 875 MG-POTASSIUM CLAVULANATE 125 MG TABLET
ORAL_TABLET | 2 refills | 0.00 days | Status: CP
Start: 2024-01-14 — End: ?

## 2024-01-14 NOTE — Unmapped (Cosign Needed)
 Self-reported severity (0-5): 4  VAS pain today: 8  VAS average pain for the last month: 10  Requiring pain medication?  Not answered   If so, what type/frequency? -  How often in pain?  continuously  Level of odor (0-5): 4  Level of itching (0-5): 3  Dressing changes needed for drainage:Several times a day  How much drainage: a lot of drainage  Flare in the last month (Y/N)? Yes.  How long ago was the last flare? in last 6 months  Developing new lesions? every day  Number of inflammatory lesions montly: 10 or more  DLQI: 21  Current treatment: :waiting on approval for bimzelx    doxycycline (MONODOX) 100 MG capsule [[id number]]   How helpful is the current treatment in managing the following aspects of your disease?  Not at all helpful Somewhat helpful Very helpful   Pain x     Decreasing length of flares x     Decreasing new lesions x     Drainage x     Decreasing frequency of flares x     Decreasing severity of flares x     Odor x

## 2024-01-14 NOTE — Unmapped (Addendum)
 Meet your team:     Your intake nurse is: Cicero Duck     Please remember to fill out the survey you will receive after your visit. Your comments help [place] continue to improve our care.      Thanks in advance!      [facility] D[facility]atology Clinical Staff       Hidradentis Suppurativa (pronounced ???high-drad-en-eye-tis/sup-your-uh-tee-vah???) is a chronic disease of hair follicles.  The lesions occur most commonly on areas of skin-to-skin contact: under the arms (axillary area), in the groin, around the buttocks, in the region around the anus and genitals, and on the skin between and under the breasts. In women, the underarms, groin, and breast areas are most commonly affected. Men most often have HS lesions around the anus and under the arms and may also have HS at the back of the neck and behind and around the ears.    What does HS look and feel like?   The first thing that someone with HS notices is a tender, raised, red bump that looks like an under-the-skin pimple or boil. Sometimes HS lesions have two or more ???heads.???  In mild disease only an occasional boil or abscess may occur, but in more active disease there can be many new lesions every month.  Some abscesses can become larger and may open and drain pus.  Bleeding and increased odor can also occur. In severe disease, deeper abscesses develop and may connect with each other under the skin to form tunnel-like tracts (sinuses, fistulas).  These may drain constantly, or may temporarily improve and then usually begin draining again over time.  In people who have had sinus tracts for some time, scars form that feel like ropes under the skin. In the very worst cases, networks of sinus tracts can form deeper in the body, including the muscle and other tissues. Many people with severe HS have scars that can limit their ability to freely move their arms or legs, though this is very unlikely for most patients.     Clinicians usually classify or ???grade??? HS using the [facility] staging system according to the severity of the disease for each body location:   [place] stage I: one or more abscesses are present, but no sinus tracts have formed and no scars have developed   Doreene Adas stage II: one or more abscesses are present that resolve and recur; on sinus tract can be present and scarring is seen   Doreene Adas stage III: many abscesses and more than one sinus tract is present with extensive scars.    What causes HS?  The cause of HS is not completely understood.  It seems to be a disorder of hair follicles and often many family members are affected so genetics probably play a strong role.  Bacteria are often present and may make the disease worse, but infection does not seem to be the main cause. Hormones are also likely play a role since the condition typically starts around puberty when hair follicles under the arms and in the groin start to change.  It can sometimes flare with menstrual cycles in women as well.  In most cases it lasts for decades and starts to improve to some extent in the late 30s and 40s as long as many fistulas have not already formed.  Women are three times more likely than men to develop HS.    Other factors are known to contribute to HS flaring or becoming worse, though they are likely not the main  causes. The factors most commonly associated with HS include:   Cigarette smoking - this is very highly linked.  Stopping smoking will likely not cure the disease, but likely is helpful in reducing how much and how often it flares.   Obesity - HS may occur even in people that are not overweight, but it is much more common in patients that are.  There is some evidence that losing weight and eating a diet low in sugars and fats may be helpful in improving hidradenitis, though this is not helpful for everyone.  Working with a nutritionist may be an important way to help with this and is something your physician can help coordinate    Hidradenitis is not contagious.  It is not caused by a problem with personal hygiene or any other activity or behavior of those with the disease.    How can your doctor help you treat your hidradenitis?  Clinicians use both medication and surgery to treat HS. The choice of treatment--or combination of treatments--is made according to an individual patient???s needs. Clinicians consider several factors in determining the most appropriate plan for therapy:   Severity of disease - medications and some laser treatments are usually able to control disease best when fistulas are not present.  Fistulas typically require surgery.   Extent and location of disease   Chronicity (how often the lesions recur)    A number of different surgical methods have been developed that are useful for certain patients under particular circumstances. These can be done with local numbing and healing at home for some areas when disease is not too extensive with relatively brief recovery times.  In more extensive disease there may be a need for larger excisions under general anesthesia with healing time in the hospital and prolonged recovery periods for better disease control.      In addition, many medical treatments have been tried--some with more success than others. No medication is effective for all patients, and you and your doctor may have to try several different agents or combinations of agents before you find the treatment plan that works best for you.  The goals of therapy with medications that are either topical (used on the skin) or systemic (taken by mouth) are:  1. to clear the lesions or at least reduce their number and extent, and  2. to prevent new lesions from forming.  3. To reduce pain, drainage, and odor  Some of the types of medications commonly used are antibacterial skin washes and the topical antibiotics to prevent secondary infections and corticosteroid injections into the lesions to reduce inflammation.     Other medications that may be used include retinoids (similar to Accutane), drugs that effect how hormones and hair follicles interact, drugs that affect your immune system (such as methotrexate, adalimumab/Humira, and Remicaid/infliximab), steroids, and oral antibiotics.    Lasers that destroy hair follicles can also be helpful since they reduce the hair follicles that cause the problems.  Multiple treatments are typically required over time and there is some discomfort associated with treatment, but it is typically very fast and well-tolerated.    It is very important to realize that hidradenitis cannot be completely cured with any single medication or surgical procedure.  It is a disease that can be very stubborn and difficult to control, but with good treatment a lot of improvement and sometimes temporary remissions can be obtained. Poorly controlled disease can cause more fistulas to form and make managing the disease much more  difficult over time so it is important to seek care to reduce major flares.  Surgery can provide a long term cure in some areas, though the disease can start again or continue in nearby areas.  A dermatologist is often the best person to help coordinate disease treatment, and sometimes other surgeons, pain specialists, other specialists, and nutritionists may be part of the treatment team.    What can you do to help your HS?  1. If your are a smoker, then stopping can probably be helpful.  Your dermatologist will be happy to refer you to some one who can help with this.  2. Follow a healthy diet and try to achieve a healthy weight  Some other self-help measures are:   Keep your skin cool and dry (becoming overheated and sweating can contribute to an HS flare)   To reduce the pain of cysts or nodules or to help them to drain, apply hot compresses or soak in hot water for 10 minutes at a time (use a clean washcloth or a teabag soaked in hot water)   For female patients, cotton underwear that does not have tight elastic in the groin can be helpful. Boyshort, brief, or boxer style underwear may be a better option as friction on hair follicles in affected areas can be a major trigger in some patients.  These can be easily found on [place] or with some retailers.  Fruit of the Loom and Underworks are two brands that are sometimes recommended.    Finally, know that you are not alone. Coping with the pain and other symptoms of HS can be very difficult, so it may be helpful to connect with others who live with HS. Patient groups and networks can be sources of important information and support. Some internet resources for information and connections are provided below.  Resources for Information    The Hidradenitis Suppurativa Foundation: A nonprofit organized by a group of physicians interested in treating and advancing research in hidradenitis suppurativa    American Academy of Dermatology  ARanked.fi    [employer]  ElevatorP[employer].de.html  [place]: [employer]  https://www.rarediseases.org/rare-disease-information/rare-diseases/byID/358/viewAbstract  Trials of new medications for HS  https://www.clinicaltrials.gov

## 2024-01-21 NOTE — Unmapped (Signed)
 [facility] Specialty Medication Onboarding    Specialty Medication: Bimzelx  Prior Authorization: Approved   Financial Assistance: No - copay  <$25  Final Copay/Day Supply: $4 / 28    Insurance Restrictions: None     Notes to Pharmacist:   Credit Card on File: no  Start Date on Rx:      The triage team has completed the benefits investigation and has determined that the patient is able to fill this medication at [facility]. Please contact the patient to complete the onboarding or follow up with the prescribing physician as needed.

## 2024-01-22 MED ORDER — EMPTY CONTAINER
3 refills | 0.00 days
Start: 2024-01-22 — End: ?

## 2024-01-22 NOTE — Unmapped (Addendum)
 I reviewed this patient case and all documentation provided by the learner and was readily available for consultation during their interaction with the patient.  I agree with the assessment and plan listed below.    [facility] Specialty Pharmacist          [facility]    Patient Onboarding/Medication Counseling    Wendy Chen is a 43 y.o. female with Hidradenitis suppurativa who I am counseling today on initiation of therapy.  I am speaking to the patient.    Was a Nurse, learning disability used for this call? No    Verified patient's date of birth / HIPAA.    Specialty medication(s) to be sent: Inflammatory Disorders: Bimzelx      Non-specialty medications/supplies to be sent: sharps      Medications not needed at this time: None         Bimzelx (bimekizumab)    Medication & Administration     Dosage: Hidradenitis Suppurativa: Inject 320 mg (given as two 160 mg injections) at Week 0, 2, 4, 6, 8, 10, 12, 14 and 16, then every 4 weeks thereafter      Lab tests required prior to treatment initiation:  Tuberculosis: Tuberculosis screening resulted in a non-reactive Quantiferon TB Gold assay.  Liver Function Tests: LFTs, Alk Phos, and bilirubin are documented in the patient's chart.      Administration:     Systems developer all supplies needed for injection on a clean, flat working surface: medication pen removed from packaging, alcohol swab, sharps container, etc.  Look at the medication label - look for correct medication, correct dose, and check the expiration date  Check the medicine through the viewing window. The medicine should be slightly pearly and colorless to pale brownish-yellow and free of particles. You may see air bubbles in the liquid. This is normal. Do not used the pre-filled pen if the medicine is cloudy, discolored, or has particles.   Lay the auto-injector pen on a flat surface and allow it to warm up to room temperature for at least 30-45 minutes  Select injection site - you can use the front of your thigh or your belly (but not the area 2 inches around your belly button); if someone else is giving you the injection you can also use your upper arm in the skin covering your triceps muscle  Prepare injection site - wash your hands and clean the skin at the injection site with an alcohol swab and let it air dry, do not touch the injection site again before the injection  Pull off the safety cap, do not remove until immediately prior to injection and do not touch the green needle cover  Put the green needle cover against your skin at the injection site at a 90 degree angle, hold the pen such that you can see the clear medication window  Press down and hold the pen firmly against your skin, there will be a click when the injection starts  Continue to hold the pen firmly against your skin for about 15 seconds - the window will start to turn solid yellow  There will be a second click sound when the injection is almost complete, verify the window is solid green to indicate the injection is complete and then pull the pen away from your skin  Dispose of the used auto-injector pen immediately in your sharps disposal container the needle will be covered  automatically  If you see any blood at the injection site, press a cotton ball or gauze on the site and maintain pressure until the bleeding stops, do not rub the injection site      Adherence/Missed dose instructions:  If your injection is given more than 4 days after your scheduled injection date - consult your pharmacist for additional instructions on how to adjust your dosing schedule.        Goals of Therapy       Hidradenitis Suppurativa  Reduce the frequency and severity of new lesions  Minimize pain and suppuration  Prevent disease progression and limit scarring  Maintenance of effective psychosocial functioning      Side Effects & Monitoring Parameters     Injection site reaction (redness, irritation, inflammation localized to the site of administration)  Signs of a common cold - minor sore throat, runny or stuffy nose, etc.  Diarrhea  Mood changes/suicidal ideation  Liver Function Tests    The following side effects should be reported to the provider:  Signs of a hypersensitivity reaction - rash; hives; itching; red, swollen, blistered, or peeling skin; wheezing; tightness in the chest or throat; difficulty breathing, swallowing, or talking; swelling of the mouth, face, lips, tongue, or throat; etc.  Reduced immune function - report signs of infection such as fever; chills; body aches; very bad sore throat; ear or sinus pain; cough; more sputum or change in color of sputum; pain with passing urine; wound that will not heal, etc.  Also at a slightly higher risk of some malignancies (mainly skin and blood cancers) due to this reduced immune function.  In the case of signs of infection - the patient should hold the next dose of Bimzelx?? and call your primary care provider to ensure adequate medical care.  Treatment may be resumed when infection is treated and patient is asymptomatic.  Muscle pain or weakness  Shortness of breath      Warnings, Precautions, & Contraindications     Have your bloodwork checked as you have been told by your prescriber  Talk with your doctor if you are pregnant, planning to become pregnant, or breastfeeding  Discuss the possible need for holding your dose(s) of Bimzelx?? when a planned procedure is scheduled with the prescriber as it may delay healing/recovery timeline       Drug/Food Interactions     Medication list reviewed in Epic. The patient was instructed to inform the care team before taking any new medications or supplements. No drug interactions identified.   Talk with you prescriber or pharmacist before receiving any live vaccinations while taking this medication and after you stop taking it      Storage, Handling Precautions, & Disposal     Store this medication in the refrigerator.  Do not freeze  May store intact Bimzelx pens and 160 mg/mL prefilled syringes at <=25??C (<=77??F) for up to 30 days; do NOT return to the refrigerator   Store in original packaging, protected from light  Do not shake  Dispose of used syringes/pens in a sharps disposal container           Current Medications (including OTC/herbals), Comorbidities and Allergies     Current Outpatient Medications   Medication Sig Dispense Refill    amoxicillin-clavulanate (AUGMENTIN) 875-125 mg per tablet Take one tablet twice daily 60 tablet 2    bimekizumab-bkzx (BIMZELX AUTOINJECTOR) 160 mg/mL AtIn Inject 320 mg subcutaneously given as 2 separate 160 mg injections every 4 weeks after  loading dose completed 2 mL 4    bimekizumab-bkzx (BIMZELX AUTOINJECTOR) 160 mg/mL AtIn Inject  320-mg, given as 2 separate 160-mg injections under the skin every 2 weeks for the first 16 weeks 2 mL 6    calcium carbonate (CALCIUM 500 ORAL) Take 3 tablets by mouth daily. Bariatric Calcium Chews- 3 chews at once      chlorthalidone (HYGROTON) 25 MG tablet Take 1 tablet (25 mg total) by mouth daily. 30 tablet 11    clindamycin (CLEOCIN T) 1 % lotion Apply topically two (2) times a day. 60 mL 6    clindamycin (CLEOCIN T) 1 % Swab Apply 1 Application topically two (2) times a day. 69 each 6    doxycycline (MONODOX) 100 MG capsule Take 1 capsule (100 mg total) by mouth two (2) times a day. 60 capsule 2    multivitamin-min-iron-FA-vit K (BARIATRIC MULTIVITAMINS) 45 mg iron- 800 mcg-120 mcg cap Take 1 tablet by mouth daily.      omeprazole (PRILOSEC) 20 MG capsule Take 1 capsule (20 mg total) by mouth daily. 30 capsule 2     No current facility-administered medications for this visit.       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)    Hx of trauma (psychological)    Migraines    Heart murmur    Sleep apnea, obstructive Class 3 obesity    Hidradenitis suppurativa    Tension headache    Low back pain    Right carpal tunnel syndrome    Left carpal tunnel syndrome    Patellofemoral pain syndrome    LLQ pain    Hidradenitis    Incisional hernia    Mixed hyperlipidemia    Morbid obesity with BMI of 45.0-49.9, adult (CMS-HCC)    Encounter for Nexplanon removal       Medication list has been reviewed and updated in Epic: Yes    Allergies have been reviewed and updated in Epic: Yes    Appropriateness of Therapy     Acute infections noted within Epic:  No active infections  Patient reported infection: None    Is the medication and dose appropriate based on diagnosis, medication list, comorbidities, allergies, medical history, patient???s ability to self-administer the medication, and therapeutic goals? Yes    Prescription has been clinically reviewed: Yes      Baseline Quality of Life Assessment      How many days over the past month did your HS  keep you from your normal activities? For example, brushing your teeth or getting up in the morning. Patient declined to answer    Financial Information     Medication Assistance provided: Prior Authorization    Anticipated copay of $4 reviewed with patient. Verified delivery address.    Delivery Information     Scheduled delivery date: 2/26    Expected start date: ASAP      Medication will be delivered via Same Day Courier to the prescription address in [facility].  This shipment will not require a signature.      Explained the services we provide at [facility] and that each month we would call to set up refills.  Stressed importance of returning phone calls so that we could ensure they receive their medications in time each month.  Informed patient that we should be setting up refills 7-10  days prior to when they will run out of medication.  A pharmacist will reach out to perform a clinical assessment periodically.  Informed patient that a welcome packet, containing information about our pharmacy and other support services, a Notice of Privacy Practices, and a drug information handout will be sent.      The patient or caregiver noted above participated in the development of this care plan and knows that they can request review of or adjustments to the care plan at any time.      Patient or caregiver verbalized understanding of the above information as well as how to contact the pharmacy at [phone] option 4 with any questions/concerns.  The pharmacy is open Monday through Friday 8:30am-4:30pm.  A pharmacist is available 24/7 via pager to answer any clinical questions they may have.    Patient Specific Needs     Does the patient have any physical, cognitive, or cultural barriers? No    Does the patient have adequate living arrangements? (i.e. the ability to store and take their medication appropriately) Yes    Did you identify any home environmental safety or security hazards? No    Patient prefers to have medications discussed with  Patient     Is the patient or caregiver able to read and understand education materials at a high school level or above? Yes    Patient's primary language is  English     Is the patient high risk? No    Does the patient have an additional or emergency contact listed in their chart? Yes    SOCIAL DETERMINANTS OF HEALTH     At the [facility], we have learned that life circumstances - like trouble affording food, housing, utilities, or transportation can affect the health of many of our patients.   That is why we wanted to ask: are you currently experiencing any life circumstances that are negatively impacting your health and/or quality of life? Patient declined to answer    Social Drivers of Health     Food Insecurity: Food Insecurity Present (11/11/2023)    Hunger Vital Sign     Worried About Running Out of Food in the Last Year: Sometimes true     Ran Out of Food in the Last Year: Sometimes true   Internet Connectivity: Internet connectivity concern unknown (11/11/2023)    Internet Connectivity     Do you have access to internet services: Yes     How do you connect to the internet: Not on file     Is your internet connection strong enough for you to watch video on your device without major problems?: Not on file     Do you have enough data to get through the month?: Not on file     Does at least one of the devices have a camera that you can use for video chat?: Not on file   Housing/Utilities: Low Risk  (11/11/2023)    Housing/Utilities     Within the past 12 months, have you ever stayed: outside, in a car, in a tent, in an overnight shelter, or temporarily in someone else's home (i.e. couch-surfing)?: No     Are you worried about losing your housing?: No     Within the past 12 months, have you been unable to get utilities (heat, electricity) when it was really needed?: No   Tobacco Use: Low Risk  (12/13/2023)    Patient History     Smoking Tobacco Use: Never  Smokeless Tobacco Use: Never     Passive Exposure: Never   Transportation Needs: No Transportation Needs (11/11/2023)    PRAPARE - Therapist, art (Medical): No     Lack of Transportation (Non-Medical): No   Alcohol Use: Not on file   Interpersonal Safety: Not on file   Physical Activity: Not on file   Intimate Partner Violence: Not on file   Stress: Not on file   Substance Use: Not on file (10/07/2023)   Social Connections: Not on file   Financial Resource Strain: Medium Risk (11/11/2023)    Overall Financial Resource Strain (CARDIA)     Difficulty of Paying Living Expenses: Somewhat hard   Depression: Not at risk (12/01/2020)    PHQ-2     PHQ-2 Score: 0   Health Literacy: Not on file       Would you be willing to receive help with any of the needs that you have identified today? Not applicable        Norva Karvonen  [facility] Specialty Pharmacist

## 2024-01-23 NOTE — Unmapped (Signed)
 Wendy Chen 's Bimzelx shipment will be delayed as a result of credit card ending in 2338 declined     I have reached out to the patient  at [phone]  and left a voicemail message.  We will wait for a call back from the patient to reschedule the delivery.  We have not confirmed the new delivery date.

## 2024-01-25 ENCOUNTER — Emergency Department: Payer: No Typology Code available for payment source

## 2024-01-25 ENCOUNTER — Emergency Department
Admission: EM | Admit: 2024-01-25 | Discharge: 2024-01-25 | Disposition: A | Discharge status: Home / Self Care | Payer: No Typology Code available for payment source | Attending: Emergency Medicine | Admitting: Emergency Medicine

## 2024-01-25 ENCOUNTER — Encounter: Payer: Self-pay | Admitting: Emergency Medicine

## 2024-01-25 ENCOUNTER — Other Ambulatory Visit: Payer: Self-pay

## 2024-01-25 DIAGNOSIS — S20219A Contusion of unspecified front wall of thorax, initial encounter: Secondary | ICD-10-CM

## 2024-01-25 DIAGNOSIS — R079 Chest pain, unspecified: Secondary | ICD-10-CM | POA: Diagnosis present

## 2024-01-25 DIAGNOSIS — R519 Headache, unspecified: Secondary | ICD-10-CM | POA: Diagnosis not present

## 2024-01-25 DIAGNOSIS — Y92410 Unspecified street and highway as the place of occurrence of the external cause: Secondary | ICD-10-CM | POA: Diagnosis not present

## 2024-01-25 DIAGNOSIS — S20212A Contusion of left front wall of thorax, initial encounter: Secondary | ICD-10-CM | POA: Diagnosis not present

## 2024-01-25 DIAGNOSIS — L732 Hidradenitis suppurativa: Secondary | ICD-10-CM | POA: Insufficient documentation

## 2024-01-25 HISTORY — DX: Hidradenitis suppurativa: L73.2

## 2024-01-25 MED ORDER — ACETAMINOPHEN 500 MG PO TABS
1000.0000 mg | ORAL_TABLET | Freq: Once | ORAL | Status: AC
  Administered 2024-01-25: 1000 mg via ORAL
  Filled 2024-01-25: qty 2

## 2024-01-25 MED FILL — EMPTY CONTAINER: 120 days supply | Qty: 1 | Fill #0

## 2024-01-30 ENCOUNTER — Encounter: Admit: 2024-01-30 | Discharge: 2024-01-31 | Payer: PRIVATE HEALTH INSURANCE

## 2024-01-30 DIAGNOSIS — R21 Rash and other nonspecific skin eruption: Principal | ICD-10-CM

## 2024-01-30 NOTE — Unmapped (Signed)
 Immediately after or during the visit, I reviewed with the resident the medical history and the resident???s findings on physical examination.  I discussed with the resident the patient???s diagnosis and concur with the treatment plan as documented in the resident note. Lyanne Co, MD

## 2024-01-30 NOTE — Unmapped (Signed)
 Video Visit Note  This medical encounter was conducted virtually using [email or web address] protocols.    I have identified myself to the patient and conveyed my credentials to Ms. Heminger  Patient/proxy has provided verbal consent to proceed with this video visit.  In case we get disconnected, [phone] (home)   In case of Emergency, patient's current location: Other: car  Is there someone else in the room? No.     Ms. Jeancharles is a 43 y.o. female that presents for a video visit today regarding the following issues:    Assessment/Plan:      Problem List Items Addressed This Visit          Unprioritized    Skin rash - Primary    Reports that she has white bumps all over her body, some of which she has tried to pop. She wonders if it is related to the recent change in antibiotic for HS. We discussed that it is difficult to assess her rash over video visit and encouraged rescheduling for an in person clinic visit. We will follow up in person next week. Encouraged following up sooner at urgent care or calling for sooner appointment with different provider if she has concerns before then.            Followup:  No follow-ups on file.      The patient reports they are physically located in [place] and is currently: not at home. I conducted a audio/video visit. I spent  26m 03s on the video call with the patient. I spent an additional 15 minutes on pre- and post-visit activities on the date of service .         Subjective:     HPI  Ms. Mac is a 43 y.o. female requesting a video visit to discuss the following issues:    Rash  - Started breaking out in white bumps all over     ROS  As per HPI.    HISTORY  I have reviewed the patient's problem list, current medications, and allergies and have updated/reconciled them as needed.    Objective:   Patient Reported Blood Pressure Readings    General: Well appearing , in no acute distress  Skin: Color is normal. No rashes.  Psych: Appropriate affect, normal mood  Resp: Normal work of breathing, no retractions

## 2024-01-30 NOTE — Unmapped (Signed)
 Reports that she has white bumps all over her body, some of which she has tried to pop. She wonders if it is related to the recent change in antibiotic for HS. We discussed that it is difficult to assess her rash over video visit and encouraged rescheduling for an in person clinic visit. We will follow up in person next week. Encouraged following up sooner at urgent care or calling for sooner appointment with different provider if she has concerns before then.

## 2024-02-07 ENCOUNTER — Ambulatory Visit: Admit: 2024-02-07 | Payer: PRIVATE HEALTH INSURANCE

## 2024-02-12 DIAGNOSIS — N6314 Unspecified lump in the right breast, lower inner quadrant: Principal | ICD-10-CM

## 2024-02-16 ENCOUNTER — Other Ambulatory Visit: Payer: Self-pay

## 2024-02-16 ENCOUNTER — Emergency Department
Admission: EM | Admit: 2024-02-16 | Discharge: 2024-02-16 | Disposition: A | Discharge status: Home / Self Care | Attending: Emergency Medicine | Admitting: Emergency Medicine

## 2024-02-16 DIAGNOSIS — Z23 Encounter for immunization: Secondary | ICD-10-CM | POA: Insufficient documentation

## 2024-02-16 DIAGNOSIS — X58XXXA Exposure to other specified factors, initial encounter: Secondary | ICD-10-CM | POA: Diagnosis not present

## 2024-02-16 DIAGNOSIS — S0502XA Injury of conjunctiva and corneal abrasion without foreign body, left eye, initial encounter: Secondary | ICD-10-CM | POA: Insufficient documentation

## 2024-02-16 DIAGNOSIS — S0993XA Unspecified injury of face, initial encounter: Secondary | ICD-10-CM | POA: Diagnosis present

## 2024-02-16 MED ORDER — FLUORESCEIN SODIUM 1 MG OP STRP
1.0000 | ORAL_STRIP | Freq: Once | OPHTHALMIC | Status: AC
  Administered 2024-02-16: 1 via OPHTHALMIC
  Filled 2024-02-16: qty 1

## 2024-02-16 MED ORDER — KETOROLAC TROMETHAMINE 0.4 % OP SOLN
1.0000 [drp] | Freq: Four times a day (QID) | OPHTHALMIC | 0 refills | Status: AC
Start: 2024-02-16 — End: 2024-02-21

## 2024-02-16 MED ORDER — TETANUS-DIPHTH-ACELL PERTUSSIS 5-2.5-18.5 LF-MCG/0.5 IM SUSY
0.5000 mL | PREFILLED_SYRINGE | Freq: Once | INTRAMUSCULAR | Status: AC
  Administered 2024-02-16: 0.5 mL via INTRAMUSCULAR

## 2024-02-16 MED ORDER — TETRACAINE HCL 0.5 % OP SOLN
2.0000 [drp] | Freq: Once | OPHTHALMIC | Status: AC
  Administered 2024-02-16: 2 [drp] via OPHTHALMIC
  Filled 2024-02-16: qty 4

## 2024-02-16 MED ORDER — TOBRAMYCIN 0.3 % OP SOLN: 2.0000 [drp] | OPHTHALMIC | 0 refills | Status: AC

## 2024-02-16 NOTE — ED Triage Notes (Signed)
 Pt to ED via POV from home. Pt reports yesterday was eating salt and vinegar chips and rubber her left eye. Pt reports pain and burning sensation. Pt flushed eye out. Pt has eye patch on. Vision intact.

## 2024-02-16 NOTE — ED Provider Notes (Signed)
 [facility] Provider Note    Event Date/Time   First MD Initiated Contact with Patient 02/16/24 1417     (approximate)   History   Eye Pain   HPI  Bridget Tyler is a 43 y.o. female with no significant past medical history presents emergency department with left eye pain.  Patient states she was eating salt and vinegar chips when she rubbed her eye.  All of a sudden had pain.  Did take her contact out.  Noticed a white streak across her eye.  States she did flush the eye out well yesterday.  Is been wearing the eye patch due to the pain and burning.  Unsure of her last Tdap      Physical Exam   Triage Vital Signs: ED Triage Vitals  Encounter Vitals Group     BP 02/16/24 1355 139/62     Systolic BP Percentile --      Diastolic BP Percentile --      Pulse Rate 02/16/24 1353 60     Resp 02/16/24 1353 20     Temp 02/16/24 1353 98 F (36.7 C)     Temp Source 02/16/24 1353 Oral     SpO2 02/16/24 1353 98 %     Weight 02/16/24 1353 223 lb (101.2 kg)     Height 02/16/24 1353 5\' 7"  (1.702 m)     Head Circumference --      Peak Flow --      Pain Score 02/16/24 1353 5     Pain Loc --      Pain Education --      Exclude from Growth Chart --     Most recent vital signs: Vitals:   02/16/24 1353 02/16/24 1355  BP:  139/62  Pulse: 60   Resp: 20   Temp: 98 F (36.7 C)   SpO2: 98%      General: Awake, no distress.   CV:  Good peripheral perfusion. regular rate and  rhythm Resp:  Normal effort. Abd:  No distention.   Other:  Left eye injected, fluorescein shows a corneal abrasion from 4:00 to 6:00, no other dye uptake noted   ED Results / Procedures / Treatments   Labs (all labs ordered are listed, but only abnormal results are displayed) Labs Reviewed - No data to display   EKG     RADIOLOGY     PROCEDURES:   Procedures Chief Complaint  Patient presents with   Eye Pain      MEDICATIONS ORDERED IN ED: Medications   tetracaine (PONTOCAINE) 0.5 % ophthalmic solution 2 drop (2 drops Left Eye Given 02/16/24 1535)  fluorescein ophthalmic strip 1 strip (1 strip Left Eye Given 02/16/24 1535)  Tdap (BOOSTRIX) injection 0.5 mL (0.5 mLs Intramuscular Given 02/16/24 1535)     IMPRESSION / MDM / ASSESSMENT AND PLAN / ED COURSE  I reviewed the triage vital signs and the nursing notes.                              Differential diagnosis includes, but is not limited to, corneal abrasion, acute conjunctivitis, glaucoma  Patient's presentation is most consistent with acute illness / injury with system symptoms.   Patient's exam is most consistent with a corneal abrasion.  Patient has no visual changes.  She had relief with the tetracaine ophthalmic drops.  Fluorescein stain did show corneal abrasion.  I did explain all  the findings to the patient.  Tdap was updated here in the ED.  She is given a prescription for tobramycin ophthalmic drops and Toradol ophthalmic drops.  She is to follow-up with [facility] next week.  She will need to call and make an appointment.  Return emergency department if worsening.  Patient is in agreement treatment plan.  She was discharged in stable condition.      FINAL CLINICAL IMPRESSION(S) / ED DIAGNOSES   Final diagnoses:  Abrasion of left cornea, initial encounter     Rx / DC Orders   ED Discharge Orders          Ordered    tobramycin (TOBREX) 0.3 % ophthalmic solution  Every 4 hours        02/16/24 1503    ketorolac (ACULAR) 0.4 % SOLN  4 times daily        02/16/24 1503             Note:  This document was prepared using Dragon voice recognition software and may include unintentional dictation errors.    Faythe Ghee, PA-C 02/16/24 1613    Minna Antis, MD 02/16/24 Norberta Keens

## 2024-02-25 ENCOUNTER — Inpatient Hospital Stay: Admit: 2024-02-25 | Discharge: 2024-02-26

## 2024-02-25 NOTE — Unmapped (Signed)
 Wendy Chen reports her Bimzelx has gone OK so far but she hasn't had any benefit yet and she's noticed some mild mood changes. She'll continue to monitor and is OK to continue medication to give it longer for effect.     [facility] Clinical Assessment & Refill Coordination Note    Wendy Chen, DOB: 1981/09/05  Phone: [phone] (home)     All above HIPAA information was verified with patient.     Was a Nurse, learning disability used for this call? No    Specialty Medication(s):   Inflammatory Disorders: Bimzelx     Current Outpatient Medications   Medication Sig Dispense Refill    amoxicillin-clavulanate (AUGMENTIN) 875-125 mg per tablet Take one tablet twice daily 60 tablet 2    bimekizumab-bkzx (BIMZELX AUTOINJECTOR) 160 mg/mL AtIn Inject 320 mg subcutaneously given as 2 separate 160 mg injections every 4 weeks after loading dose completed 2 mL 4    bimekizumab-bkzx (BIMZELX AUTOINJECTOR) 160 mg/mL AtIn Inject  320-mg, given as 2 separate 160-mg injections under the skin every 2 weeks for the first 16 weeks 4 mL 6    calcium carbonate (CALCIUM 500 ORAL) Take 3 tablets by mouth daily. Bariatric Calcium Chews- 3 chews at once      chlorthalidone (HYGROTON) 25 MG tablet Take 1 tablet (25 mg total) by mouth daily. 30 tablet 11    clindamycin (CLEOCIN T) 1 % lotion Apply topically two (2) times a day. 60 mL 6    clindamycin (CLEOCIN T) 1 % Swab Apply 1 Application topically two (2) times a day. 69 each 6    doxycycline (MONODOX) 100 MG capsule Take 1 capsule (100 mg total) by mouth two (2) times a day. (Patient not taking: Reported on 01/22/2024) 60 capsule 2    empty container Misc use as directed 1 each 3    multivitamin-min-iron-FA-vit K (BARIATRIC MULTIVITAMINS) 45 mg iron- 800 mcg-120 mcg cap Take 1 tablet by mouth daily.      omeprazole (PRILOSEC) 20 MG capsule Take 1 capsule (20 mg total) by mouth daily. 30 capsule 2     No current facility-administered medications for this visit. Changes to medications: Wendy Chen reports no changes at this time.    Medication list has been reviewed and updated in Epic: Yes    No Known Allergies    Changes to allergies: No    Allergies have been reviewed and updated in Epic: Yes    SPECIALTY MEDICATION ADHERENCE     Bimzelx - 0 left  Medication Adherence    Patient reported X missed doses in the last month: 0  Specialty Medication: Bimzelx          Specialty medication(s) dose(s) confirmed: Regimen is correct and unchanged.     Are there any concerns with adherence? No    Adherence counseling provided? Not needed    CLINICAL MANAGEMENT AND INTERVENTION      Clinical Benefit Assessment:    Do you feel the medicine is effective or helping your condition? No    Clinical Benefit counseling provided? Reasonable expectations discussed: 3 -6 months can be typical for full effect    Adverse Effects Assessment:    Are you experiencing any side effects?  Yes - patient reports mood changes/feeling angry or aggressive more. She reports overall it's OK/mild and she's OK to monitor and keep going on medicine. We discussed to report any significant concerns to her provider/us.      Are you experiencing difficulty administering  your medicine? No    Quality of Life Assessment:    Quality of Life    Rheumatology  Oncology  Dermatology  Cystic Fibrosis          How many days over the past month did your HS  keep you from your normal activities? For example, brushing your teeth or getting up in the morning. Patient declined to answer    Have you discussed this with your provider? Not needed    Acute Infection Status:    Acute infections noted within Epic:  No active infections    Patient reported infection: None    Therapy Appropriateness:    Is therapy appropriate based on current medication list, adverse reactions, adherence, clinical benefit and progress toward achieving therapeutic goals? Yes, therapy is appropriate and should be continued     Clinical Intervention:    Was an intervention completed as part of this clinical assessment? No    DISEASE/MEDICATION-SPECIFIC INFORMATION      For patients on injectable medications: Patient currently has 0 doses left.  Next injection is scheduled for 4/4.    Chronic Inflammatory Diseases: Have you experienced any flares in the last month? No  Has this been reported to your provider? No    PATIENT SPECIFIC NEEDS     Does the patient have any physical, cognitive, or cultural barriers? No    Is the patient high risk? No    Does the patient require physician intervention or other additional services (i.e., nutrition, smoking cessation, social work)? No    Does the patient have an additional or emergency contact listed in their chart? Yes    SOCIAL DETERMINANTS OF HEALTH     At the [facility], we have learned that life circumstances - like trouble affording food, housing, utilities, or transportation can affect the health of many of our patients.   That is why we wanted to ask: are you currently experiencing any life circumstances that are negatively impacting your health and/or quality of life? Patient declined to answer    Social Drivers of Health     Food Insecurity: Food Insecurity Present (11/11/2023)    Hunger Vital Sign     Worried About Running Out of Food in the Last Year: Sometimes true     Ran Out of Food in the Last Year: Sometimes true   Tobacco Use: Low Risk  (12/13/2023)    Patient History     Smoking Tobacco Use: Never     Smokeless Tobacco Use: Never     Passive Exposure: Never   Transportation Needs: No Transportation Needs (11/11/2023)    PRAPARE - Transportation     Lack of Transportation (Medical): No     Lack of Transportation (Non-Medical): No   Alcohol Use: Not on file   Housing: Low Risk  (11/11/2023)    Housing     Within the past 12 months, have you ever stayed: outside, in a car, in a tent, in an overnight shelter, or temporarily in someone else's home (i.e. couch-surfing)?: No     Are you worried about losing your housing?: No   Physical Activity: Not on file   Utilities: Low Risk  (11/11/2023)    Utilities     Within the past 12 months, have you been unable to get utilities (heat, electricity) when it was really needed?: No   Stress: Not on file   Interpersonal Safety: Not on file   Substance Use: Not on file (10/07/2023)   Intimate  Partner Violence: Not on file   Social Connections: Not on file   Financial Resource Strain: Medium Risk (11/11/2023)    Overall Financial Resource Strain (CARDIA)     Difficulty of Paying Living Expenses: Somewhat hard   Depression: Not at risk (12/01/2020)    PHQ-2     PHQ-2 Score: 0   Internet Connectivity: Internet connectivity concern unknown (11/11/2023)    Internet Connectivity     Do you have access to internet services: Yes     How do you connect to the internet: Not on file     Is your internet connection strong enough for you to watch video on your device without major problems?: Not on file     Do you have enough data to get through the month?: Not on file     Does at least one of the devices have a camera that you can use for video chat?: Not on file   Health Literacy: Not on file       Would you be willing to receive help with any of the needs that you have identified today? Not applicable       SHIPPING     Specialty Medication(s) to be Shipped:   Inflammatory Disorders: Bimzelx    Other medication(s) to be shipped: No additional medications requested for fill at this time     Changes to insurance: No    Cost and Payment: Patient has a copay of $4. They are aware and have authorized the pharmacy to charge the credit card on file.    Delivery Scheduled: Yes, Expected medication delivery date: 4/1.     Medication will be delivered via Same Day Courier to the confirmed prescription address in [facility].    The patient will receive a drug information handout for each medication shipped and additional FDA Medication Guides as required.  Verified that patient has previously received a Conservation officer, historic buildings and a Surveyor, mining.    The patient or caregiver noted above participated in the development of this care plan and knows that they can request review of or adjustments to the care plan at any time.      All of the patient's questions and concerns have been addressed.    [facility] Specialty Pharmacist

## 2024-02-26 MED FILL — BIMZELX AUTOINJECTOR 160 MG/ML SUBCUTANEOUS AUTO-INJECTOR: 28 days supply | Qty: 4 | Fill #1

## 2024-03-06 ENCOUNTER — Ambulatory Visit
Admit: 2024-03-06 | Discharge: 2024-03-07 | Attending: Student in an Organized Health Care Education/Training Program | Primary: Student in an Organized Health Care Education/Training Program

## 2024-03-06 ENCOUNTER — Ambulatory Visit: Admit: 2024-03-06 | Discharge: 2024-03-07

## 2024-03-06 DIAGNOSIS — I10 Essential (primary) hypertension: Principal | ICD-10-CM

## 2024-03-06 DIAGNOSIS — N644 Mastodynia: Principal | ICD-10-CM

## 2024-03-06 DIAGNOSIS — R21 Rash and other nonspecific skin eruption: Principal | ICD-10-CM
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
## 2024-03-06 NOTE — Unmapped (Addendum)
 Initially reported itchy white bumps all over her body that eventually progressed to dark spots. No treatment. 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 Differential includes drug reaction vs contact derm vs atopic derm. Skin clinic referral placed today.
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
## 2024-03-06 NOTE — Unmapped (Addendum)
 Reports previously taking chlorthalidone. Has been without medication for the past several months. Blood pressure well controlled today w/o medication. Will DISCONTINUE chlorthalidone, CTM.

## 2024-03-07 NOTE — Unmapped (Signed)
 1st attempt: Called pt regarding new breast referral. Left vm and provided call back number

## 2024-03-10 NOTE — Unmapped (Signed)
 2nd attempt: Left vm regarding breat referral. Provided call back number to schedule

## 2024-03-11 DIAGNOSIS — R928 Other abnormal and inconclusive findings on diagnostic imaging of breast: Principal | ICD-10-CM

## 2024-03-11 NOTE — Unmapped (Signed)
 Multidisciplinary Oncology Program Intake Form    Referral Receive Date:  03/06/24    Rapid Access Appointment : No -  Patient not in scope for Rapid Access    Reason for Referral:   Initial Consultation: No treatment started for diagnosis  Disease Group: Breast  Specialty:  Surgery    Referral Method:  WQ    Insurance  Primary Insurance: Medicaid    Authorization Obtained: NA      Record Collection:     RECORDS -  INTERNAL REFERRAL, IMAGING -  INTERNAL REFERRAL, PATHOLOGY - No path needed    Screening Questions:     If <43, is the patient interested in learning about [facility]? NA    Close the Loop Communication:  Referring Provider Contacted: Yes  Patient Contacted: Yes    Welcome Packet   Date Sent: N/A  Sent: N/A    Family Hx: Maternal Uncle (Prostate cancer) Paternal Uncle (Colon Cancer) * Sister had lump but no cancer

## 2024-03-25 NOTE — Unmapped (Signed)
 [facility] Refill Coordination Note    Specialty Medication(s) to be Shipped:   Inflammatory Disorders: Bimzelx    Other medication(s) to be shipped: No additional medications requested for fill at this time     Wendy Chen, DOB: November 06, 1981  Phone: [phone] (home)       All above HIPAA information was verified with patient.     Was a Nurse, learning disability used for this call? No    Completed refill call assessment today to schedule patient's medication shipment from the [facility] Delivery Pharmacy  [phone]).  All relevant notes have been reviewed.     Specialty medication(s) and dose(s) confirmed: Regimen is correct and unchanged.   Changes to medications: Brandlyn reports no changes at this time.  Changes to insurance: No  New side effects reported not previously addressed with a pharmacist or physician: None reported  Questions for the pharmacist: No    Confirmed patient received a Conservation officer, historic buildings and a Surveyor, mining with first shipment. The patient will receive a drug information handout for each medication shipped and additional FDA Medication Guides as required.       DISEASE/MEDICATION-SPECIFIC INFORMATION        For patients on injectable medications: Patient currently has 0 doses left.  Next injection is scheduled for 03/28/24.    SPECIALTY MEDICATION ADHERENCE     Medication Adherence    Patient reported X missed doses in the last month: 0  Specialty Medication: (BIMZELX AUTOINJECTOR) 160 mg/mL AtIn  Patient is on additional specialty medications: No              Were doses missed due to medication being on hold? No    BIMZELX AUTOINJECTOR 160 mg/mL Atin (bimekizumab-bkzx)  : 0 doses of medicine on hand     REFERRAL TO PHARMACIST     Referral to the pharmacist: Not needed      SHIPPING     Shipping address confirmed in Epic.     Cost and Payment: Patient has a copay of $4. They are aware and have authorized the pharmacy to charge the credit card on file.    Delivery Scheduled: Yes, Expected medication delivery date: 03/27/24.     Medication will be delivered via Same Day Courier to the prescription address in Epic [facility].    [facility]  Specialty Technician

## 2024-03-27 NOTE — Unmapped (Signed)
 Wendy Chen 's Bimzelx shipment will be delayed as a result of credit card ending in 2338 declined     I have reached out to the patient  at [phone]  and communicated the delay. We will wait for a call back from the patient to reschedule the delivery.  We have not confirmed the new delivery date.

## 2024-03-28 MED FILL — BIMZELX AUTOINJECTOR 160 MG/ML SUBCUTANEOUS AUTO-INJECTOR: 28 days supply | Qty: 2 | Fill #0

## 2024-03-28 NOTE — Unmapped (Signed)
 Wendy Chen 's Bimzelx shipment will be rescheduled as a result of credit card on file and copay collected.     I have reached out to the patient  via incoming phone call and communicated the delivery change. We will reschedule the medication for the delivery date that the patient agreed upon.  We have confirmed the delivery date as 5/2, via same day courier.

## 2024-03-28 NOTE — Unmapped (Signed)
 Bariatric Surgery 1at Annual Postop Letter, schedule with Tara and offer RD appt

## 2024-04-15 ENCOUNTER — Ambulatory Visit: Admit: 2024-04-15 | Discharge: 2024-04-16 | Payer: Medicaid (Managed Care)

## 2024-04-15 DIAGNOSIS — N6310 Unspecified lump in the right breast, unspecified quadrant: Principal | ICD-10-CM

## 2024-04-15 DIAGNOSIS — N644 Mastodynia: Principal | ICD-10-CM
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
## 2024-04-23 NOTE — Unmapped (Signed)
 [facility] Specialty and Home Delivery Pharmacy Refill Coordination Note    Specialty Medication(s) to be Shipped:   Inflammatory Disorders: Bimzelx    Other medication(s) to be shipped: No additional medications requested for fill at this time     Wendy Chen, DOB: 1981/09/28  Phone: [phone] (home)       All above HIPAA information was verified with patient.     Was a Nurse, learning disability used for this call? No    Completed refill call assessment today to schedule patient's medication shipment from the [facility] and Home Delivery Pharmacy  [phone]).  All relevant notes have been reviewed.     Specialty medication(s) and dose(s) confirmed: Regimen is correct and unchanged.   Changes to medications: Wendy Chen reports no changes at this time.  Changes to insurance: No  New side effects reported not previously addressed with a pharmacist or physician: None reported  Questions for the pharmacist: No    Confirmed patient received a Conservation officer, historic buildings and a Surveyor, mining with first shipment. The patient will receive a drug information handout for each medication shipped and additional FDA Medication Guides as required.       DISEASE/MEDICATION-SPECIFIC INFORMATION        For patients on injectable medications: Patient currently has 0 doses left.  Next injection is scheduled for 04/25/24.    SPECIALTY MEDICATION ADHERENCE     Medication Adherence    Specialty Medication: bimekizumab-bkzx (BIMZELX AUTOINJECTOR) 160 mg/mL AtIn              Were doses missed due to medication being on hold? No    BIMZELX AUTOINJECTOR 160 mg/mL Atin (bimekizumab-bkzx)  : 0 doses of medicine on hand     REFERRAL TO PHARMACIST     Referral to the pharmacist: Not needed      SHIPPING     Shipping address confirmed in Epic.     Cost and Payment: Patient has a copay of $4. They are aware and have authorized the pharmacy to charge the credit card on file.    Delivery Scheduled: Yes, Expected medication delivery date: 04/25/24. Medication will be delivered via Same Day Courier to the prescription address in Epic WAM.    Wendy Chen [facility] and [facility]

## 2024-04-25 MED FILL — BIMZELX AUTOINJECTOR 160 MG/ML SUBCUTANEOUS AUTO-INJECTOR: 28 days supply | Qty: 4 | Fill #2

## 2024-04-25 NOTE — Unmapped (Signed)
 Pre-procedure. Attempted to contact patient to confirm 04/28/24 appointment and to review pre-procedure instructions. Left voicemail which included purpose of call and Nurse Navigator's contact information.

## 2024-04-28 ENCOUNTER — Inpatient Hospital Stay: Admit: 2024-04-28 | Discharge: 2024-04-29 | Payer: Medicaid (Managed Care)

## 2024-04-28 NOTE — Unmapped (Signed)
 Bariatric Surgery 1at Annual Postop Letter, schedule with Tara and offer RD appt

## 2024-06-03 NOTE — Unmapped (Signed)
 The [facility] has made a second and final attempt to reach this patient to refill the following medication:bimzelx  loading dose.      We have left voicemails on the following phone numbers: [phone], have sent a MyChart message, and have sent a text message to the following phone numbers: [phone].    Dates contacted: 6/20, 7/8  Last scheduled delivery: 04/25/2024    The patient may be at risk of non-compliance with this medication. The patient should call the [facility] at [phone]  Option 4, then Option 2: Dermatology, Gastroenterology, Rheumatology to refill medication.    [name]   [facility] Delivery [facility] Specialty Technician

## 2024-06-10 NOTE — Unmapped (Signed)
 [facility] Special[facility] and Home Delivery Pharmacy Refill Coordination Note    Specialty Medication(s) to be Shipped:   Inflammatory Disorders: Bimzelx     Other medication(s) to be shipped: No additional medications requested for fill at this time     Wendy Chen, DOB: May 24, 1981  Phone: [phone] (home)       All above HIPAA information was verified with patient.     Was a Nurse, learning disability used for this call? No    Completed refill call assessment today to schedule patient's medication shipment from the [facility] and Home Delivery Pharmacy  [phone]).  All relevant notes have been reviewed.     Specialty medication(s) and dose(s) confirmed: Regimen is correct and unchanged.   Changes to medications: [name] reports no changes at this time.  Changes to insurance: No  New side effects reported not previously addressed with a pharmacist or physician: None reported  Questions for the pharmacist: No    Confirmed patient received a Conservation officer, historic buildings and a Surveyor, mining with first shipment. The patient will receive a drug information handout for each medication shipped and additional FDA Medication Guides as required.       DISEASE/MEDICATION-SPECIFIC INFORMATION        For patients on injectable medications: Patient currently has 0 doses left.  Next injection is scheduled for 06/11/24.    SPECIALTY MEDICATION ADHERENCE     Medication Adherence    Patient reported X missed doses in the last month: 1  Specialty Medication: LOADING DOSE - BIMZELX  AUTOINJECTOR 160 mg/mL Atin (bimekizumab -bkzx)  Patient is on additional specialty medications: No  Patient is on more than two specialty medications: No              Were doses missed due to medication being on hold? No    bimekizumab -bkzx: BIMZELX  AUTOINJECTOR 160 mg/mL Atin  : 0 days of medicine on hand       REFERRAL TO PHARMACIST     Referral to the pharmacist: Yes - routine compliance concerns. Patient has missed 1-3 doses of medication. Refills were scheduled and concern routed to pharmacist for evaluation.      SHIPPING     Shipping address confirmed in Epic.     Cost and Payment: Patient has a copay of $4. They are aware and have authorized the pharmacy to charge the credit card on file.    Delivery Scheduled: Yes, Expected medication delivery date: 06/12/24.     Medication will be delivered via Same Day Courier to the prescription address in Epic WAM.    [name]   [facility] and Home Delivery Pharm[facility]  Specialty Technician

## 2024-06-12 MED FILL — BIMZELX AUTOINJECTOR 160 MG/ML SUBCUTANEOUS AUTO-INJECTOR: SUBCUTANEOUS | 28 days supply | Qty: 4 | Fill #3

## 2024-06-23 DIAGNOSIS — Z79899 Other long term (current) drug therapy: Principal | ICD-10-CM

## 2024-06-23 DIAGNOSIS — L910 Hypertrophic scar: Principal | ICD-10-CM

## 2024-06-23 DIAGNOSIS — L732 Hidradenitis suppurativa: Principal | ICD-10-CM

## 2024-06-23 NOTE — Unmapped (Signed)
 Dermatology Note    Assessment and Plan:      Hidradenitis Suppurativa, Hurley Stage III, Chronic, Flaring   - Patient suffering with significant pain and drainage, has previously tried and failed multiple antibiotics, Humira , Cosentyx , and is unable to go for infusions regularly.   - Started Bimzelx  03/2024 and seems to be improving so far so will complete loading and continue 320mg  every 4 weeks after that  -  likely to need relatively extensive surgery for the axillae (8-10cm) in the future and perhaps more focally (multiple 3-5cm on abdomen and inguinal creases), less extensive on buttocks.      High Risk Medication (In Anticipation of Starting Bimzelx )  - Quant gold negative Jan 2025,   - Hepatitis serologies negative 2022  - CBC and CMP wnl March 2024    RTC: 4-6 months  _________________________________________________________________      Chief Complaint     Chief Complaint   Patient presents with    [facility]     Buttocks and groin with drainage       HPI     Wendy Chen is a 43 y.o. female who presents as a returning patient (last seen  01/14/2024) for follow up of hidradenitis suppurativa. Started Bimzelx  since last visit and feels like it is helping so far with less pain and bette ability to be physically active, but still with a lot of drainage.      Pertinent Past Medical History       Past Medical History, Family History, Social History, Medication List, Allergies, and Problem List were reviewed in the rooming section of Epic.     ROS: Other than symptoms mentioned in the HPI, no fevers, chills, or other skin complaints    Physical Examination     Gen: Well-appearing patient, appropriate, interactive, in no acute distress  Neuro: Alert and oriented, answers questions appropriately  SKIN: Examination of the bilateral axillae, chest, abdomen, groin, and buttocks was performed    location Abscess Inflamed nodule Non-inflamed nodule Draining sinus Non-draining Sinus Hurley % scar   R axilla  5  2 3      L axilla  5  3 2      R inframammary          L inframammary          Intermammary          Pubic          R inguinal  1  1 1      R thigh          L inguinal  1   2     L thigh          Scrotum/Vulva          Perianal          R buttock          L buttock          Other (list)          abdomen  2  1 2        AN count (total sum of abscess and inflammatory nodule): 14    - All sites not commented on demonstrate normal findings.

## 2024-06-23 NOTE — Unmapped (Addendum)
 Meet your team:     Your intake nurse is: Aurora    Please remember to fill out the survey you will receive after your visit. Your comments help us  continue to improve our care.      Thanks in advance!      [facility] Dermatology Clinical Staff       Hidradentis Suppurativa (pronounced ???high-drad-en-eye-tis/sup-your-uh-tee-vah???) is a chronic disease of hair follicles.  The lesions occur most commonly on areas of skin-to-skin contact: under the arms (axillary area), in the groin, around the buttocks, in the region around the anus and genitals, and on the skin between and under the breasts. In women, the underarms, groin, and breast areas are most commonly affected. Men most often have HS lesions around the anus and under the arms and may also have HS at the back of the neck and behind and around the ears.    What does HS look and feel like?   The first thing that someone with HS notices is a tender, raised, red bump that looks like an under-the-skin pimple or boil. Sometimes HS lesions have two or more ???heads.???  In mild disease only an occasional boil or abscess may occur, but in more active disease there can be many new lesions every month.  Some abscesses can become larger and may open and drain pus.  Bleeding and increased odor can also occur. In severe disease, deeper abscesses develop and may connect with each other under the skin to form tunnel-like tracts (sinuses, fistulas).  These may drain constantly, or may temporarily improve and then usually begin draining again over time.  In people who have had sinus tracts for some time, scars form that feel like ropes under the skin. In the very worst cases, networks of sinus tracts can form deeper in the body, including the muscle and other tissues. Many people with severe HS have scars that can limit their ability to freely move their arms or legs, though this is very unlikely for most patients.     Clinicians usually classify or ???grade??? HS using the [facility] staging system according to the severity of the disease for each body location:   Halfway stage I: one or more abscesses are present, but no sinus tracts have formed and no scars have developed   Tressia stage II: one or more abscesses are present that resolve and recur; on sinus tract can be present and scarring is seen   Tressia stage III: many abscesses and more than one sinus tract is present with extensive scars.    What causes HS?  The cause of HS is not completely understood.  It seems to be a disorder of hair follicles and often many family members are affected so genetics probably play a strong role.  Bacteria are often present and may make the disease worse, but infection does not seem to be the main cause. Hormones are also likely play a role since the condition typically starts around puberty when hair follicles under the arms and in the groin start to change.  It can sometimes flare with menstrual cycles in women as well.  In most cases it lasts for decades and starts to improve to some extent in the late 30s and 40s as long as many fistulas have not already formed.  Women are three times more likely than men to develop HS.    Other factors are known to contribute to HS flaring or becoming worse, though they are likely not the main causes.  The factors most commonly associated with HS include:   Cigarette smoking - this is very highly linked.  Stopping smoking will likely not cure the disease, but likely is helpful in reducing how much and how often it flares.   Obesity - HS may occur even in people that are not overweight, but it is much more common in patients that are.  There is some evidence that losing weight and eating a diet low in sugars and fats may be helpful in improving hidradenitis, though this is not helpful for everyone.  Working with a nutritionist may be an important way to help with this and is something your physician can help coordinate    Hidradenitis is not contagious.  It is not caused by a problem with personal hygiene or any other activity or behavior of those with the disease.    How can your doctor help you treat your hidradenitis?  Clinicians use both medication and surgery to treat HS. The choice of treatment--or combination of treatments--is made according to an individual patient???s needs. Clinicians consider several factors in determining the most appropriate plan for therapy:   Severity of disease - medications and some laser treatments are usually able to control disease best when fistulas are not present.  Fistulas typically require surgery.   Extent and location of disease   Chronicity (how often the lesions recur)    A number of different surgical methods have been developed that are useful for certain patients under particular circumstances. These can be done with local numbing and healing at home for some areas when disease is not too extensive with relatively brief recovery times.  In more extensive disease there may be a need for larger excisions under general anesthesia with healing time in the hospital and prolonged recovery periods for better disease control.      In addition, many medical treatments have been tried--some with more success than others. No medication is effective for all patients, and you and your doctor may have to try several different agents or combinations of agents before you find the treatment plan that works best for you.  The goals of therapy with medications that are either topical (used on the skin) or systemic (taken by mouth) are:  1. to clear the lesions or at least reduce their number and extent, and  2. to prevent new lesions from forming.  3. To reduce pain, drainage, and odor  Some of the types of medications commonly used are antibacterial skin washes and the topical antibiotics to prevent secondary infections and corticosteroid injections into the lesions to reduce inflammation.     Other medications that may be used include retinoids (similar to Accutane), drugs that effect how hormones and hair follicles interact, drugs that affect your immune system (such as methotrexate, adalimumab /Humira , and Remicaid/infliximab), steroids, and oral antibiotics.    Lasers that destroy hair follicles can also be helpful since they reduce the hair follicles that cause the problems.  Multiple treatments are typically required over time and there is some discomfort associated with treatment, but it is typically very fast and well-tolerated.    It is very important to realize that hidradenitis cannot be completely cured with any single medication or surgical procedure.  It is a disease that can be very stubborn and difficult to control, but with good treatment a lot of improvement and sometimes temporary remissions can be obtained. Poorly controlled disease can cause more fistulas to form and make managing the disease much more difficult  over time so it is important to seek care to reduce major flares.  Surgery can provide a long term cure in some areas, though the disease can start again or continue in nearby areas.  A dermatologist is often the best person to help coordinate disease treatment, and sometimes other surgeons, pain specialists, other specialists, and nutritionists may be part of the treatment team.    What can you do to help your HS?  1. If your are a smoker, then stopping can probably be helpful.  Your dermatologist will be happy to refer you to some one who can help with this.  2. Follow a healthy diet and try to achieve a healthy weight  Some other self-help measures are:   Keep your skin cool and dry (becoming overheated and sweating can contribute to an HS flare)   To reduce the pain of cysts or nodules or to help them to drain, apply hot compresses or soak in hot water  for 10 minutes at a time (use a clean washcloth or a teabag soaked in hot water )   For female patients, cotton underwear that does not have tight elastic in the groin can be helpful. Boyshort, brief, or boxer style underwear may be a better option as friction on hair follicles in affected areas can be a major trigger in some patients.  These can be easily found on [place] or with some retailers.  Fruit of the Loom and Underworks are two brands that are sometimes recommended.    Finally, know that you are not alone. Coping with the pain and other symptoms of HS can be very difficult, so it may be helpful to connect with others who live with HS. Patient groups and networks can be sources of important information and support. Some internet resources for information and connections are provided below.  Resources for Information    The Hidradenitis Suppurativa Foundation: A nonprofit organized by a group of physicians interested in treating and advancing research in hidradenitis suppurativa    American Academy of Dermatology  ARanked.fi    [employer]  ElevatorPitchers.de.html  [employer]: [employer]  https://www.rarediseases.org/rare-disease-information/rare-diseases/byID/358/viewAbstract  Trials of new medications for HS  https://www.clinicaltrials.gov

## 2024-06-23 NOTE — Unmapped (Signed)
 Self-reported severity (0-5): 3  VAS pain today: 4  VAS average pain for the last month: 6  Requiring pain medication? Yes.  If so, what type/frequency? Tylenol   How often in pain?  few times a week  Level of odor (0-5): 3  Level of itching (0-5): 4  Dressing changes needed for drainage:Several times a day  How much drainage: a lot of drainage  Flare in the last month (Y/N)? Yes.  How long ago was the last flare? in last 6 months  Developing new lesions? once a week  Number of inflammatory lesions montly: 5-10  DLQI: 23  Current treatment: Amoxicillin ,bimzelx      How helpful is the current treatment in managing the following aspects of your disease?  Not at all helpful Somewhat helpful Very helpful   Pain x     Decreasing length of flares   x   Decreasing new lesions  x    Drainage x     Decreasing frequency of flares  x    Decreasing severity of flares  x    Odor x

## 2024-06-30 NOTE — Unmapped (Signed)
 [facility] Delivery Pharm[facility] Refill Coordination Note    Specialty Medication(s) to be Shipped:   Inflammatory Disorders: Bimzelx     Other medication(s) to be shipped: No additional medications requested for fill at this time     Wendy Chen, DOB: Jun 23, 1981  Phone: [phone] (home)       All above HIPAA information was verified with patient.     Was a Nurse, learning disability used for this call? No    Completed refill call assessment today to schedule patient's medication shipment from the [facility]  [phone]).  All relevant notes have been reviewed.     Specialty medication(s) and dose(s) confirmed: Regimen is correct and unchanged.   Changes to medications: Wendy Chen reports no changes at this time.  Changes to insurance: No  New side effects reported not previously addressed with a pharmacist or physician: None reported  Questions for the pharmacist: No    Confirmed patient received a Conservation officer, historic buildings and a Surveyor, mining with first shipment. The patient will receive a drug information handout for each medication shipped and additional FDA Medication Guides as required.       DISEASE/MEDICATION-SPECIFIC INFORMATION        For patients on injectable medications: Patient currently has 0 doses left.  Next injection is scheduled for 07/10/24.    SPECIALTY MEDICATION ADHERENCE     Medication Adherence    Patient reported X missed doses in the last month: 0  Specialty Medication: Bimzelx  160 mg/mL  Patient is on additional specialty medications: No  Informant: patient     Were doses missed due to medication being on hold? No    Bimzelx  160 mg/mL: 0 days of medicine on hand       REFERRAL TO PHARMACIST     Referral to the pharmacist: Not needed      [facility]     Shipping address confirmed in Epic.     Cost and Payment: Patient has a copay of $4. They are aware and have authorized the pharmacy to charge the credit card on file.    Delivery Scheduled: Yes, Expected medication delivery date: 07/08/24.     Medication will be delivered via Same Day Courier to the prescription address in Epic [place].    Tag Wurtz M Santer Torres   [facility]  Specialty Technician

## 2024-07-08 DIAGNOSIS — L732 Hidradenitis suppurativa: Principal | ICD-10-CM

## 2024-07-08 MED ORDER — CLINDAMYCIN PHOSPHATE 1 % TOPICAL SWAB
Freq: Two times a day (BID) | TOPICAL | 6 refills | 0.00000 days | Status: CP
Start: 2024-07-08 — End: ?

## 2024-07-08 MED FILL — BIMZELX AUTOINJECTOR 160 MG/ML SUBCUTANEOUS AUTO-INJECTOR: SUBCUTANEOUS | 28 days supply | Qty: 4 | Fill #4

## 2024-07-08 NOTE — Unmapped (Signed)
 Requested Prescriptions     Pending Prescriptions Disp Refills    clindamycin  (CLEOCIN  T) 1 % Swab 69 each 6     Sig: Apply 1 Application topically two (2) times a day.

## 2024-09-04 NOTE — Unmapped (Signed)
 I reviewed with Wendy Chen that she's finished her 9 Bimzelx  loading doses and will transition to 1/month moving forward. She reports good improvement under her arms but still has HS activity in the groin area.     [facility] Delivery Pharm[facility] Clinical Assessment & Refill Coordination Note    Wendy Chen, DOB: 1981/10/06  Phone: [phone] (home)     All above HIPAA information was verified with patient.     Was a Nurse, learning disability used for this call? No    Specialty Medication(s):   Inflammatory Disorders: Bimzelx      Current Medications[1]     Changes to medications: Ailany reports no changes at this time.    Medication list has been reviewed and updated in Epic: Yes    Allergies[2]    Changes to allergies: No    Allergies have been reviewed and updated in Epic: Yes    SPECIALTY MEDICATION ADHERENCE     Bimzelx  160 mg (inject 2 pens): 0 doses of medicine on hand   Medication Adherence    Patient reported X missed doses in the last month: 0  Specialty Medication: Bimzelx           Specialty medication(s) dose(s) confirmed: Regimen is correct and unchanged.     Are there any concerns with adherence? No    Adherence counseling provided? Not needed    CLINICAL MANAGEMENT AND INTERVENTION      Clinical Benefit Assessment:    Do you feel the medicine is effective or helping your condition? Yes    Clinical Benefit counseling provided? Not needed    Adverse Effects Assessment:    Are you experiencing any side effects? Unclear - patient reports headaches but they seem to be improving lately.     Are you experiencing difficulty administering your medicine? No    Quality of Life Assessment:    Quality of Life    Rheumatology  Oncology  Dermatology  1. What impact has your specialty medication had on the symptoms of your skin condition (i.e. itchiness, soreness, stinging)?: Some  2. What impact has your specialty medication had on your comfort level with your skin?: Some  Cystic Fibrosis          How many days over the past month did your HS  keep you from your normal activities? For example, brushing your teeth or getting up in the morning. Patient declined to answer    Have you discussed this with your provider? Not needed    Acute Infection Status:    Acute infections noted within Epic:  No active infections    Patient reported infection: None    Therapy Appropriateness:    Is the medication and dose appropriate considering the patient???s diagnosis, treatment, and disease journey, comorbidities, medical history, current medications, allergies, therapeutic goals, self-administration ability, and access barriers? Yes, therapy is appropriate and should be continued     Clinical Intervention:    Was an intervention completed as part of this clinical assessment? No    DISEASE/MEDICATION-SPECIFIC INFORMATION      For patients on injectable medications: Next injection is scheduled for asap (looks like technically due 10/9 but will receive on 10/10).    Chronic Inflammatory Diseases: Have you experienced any flares in the last month? Yes, still with continued flaring/activity in old HS spots in groin. She reports improvement/no flaring under arms   Has this been reported to your provider? No    PATIENT SPECIFIC NEEDS     Does the patient  have any physical, cognitive, or cultural barriers? No    Is the patient high risk? No    Does the patient require physician intervention or other additional services (i.e., nutrition, smoking cessation, social work)? No    Does the patient have an additional or emergency contact listed in their chart? Yes    SOCIAL DETERMINANTS OF HEALTH     At the [facility], we have learned that life circumstances - like trouble affording food, housing, utilities, or transportation can affect the health of many of our patients.   That is why we wanted to ask: are you currently experiencing any life circumstances that are negatively impacting your health and/or quality of life? Patient declined to answer    Social Drivers of Health     Food Insecurity: No Food Insecurity (04/15/2024)    Hunger Vital Sign     Worried About Running Out of Food in the Last Year: Never true     Ran Out of Food in the Last Year: Never true   Tobacco Use: Low Risk  (04/15/2024)    Patient History     Smoking Tobacco Use: Never     Smokeless Tobacco Use: Never     Passive Exposure: Never   Transportation Needs: No Transportation Needs (04/15/2024)    PRAPARE - Transportation     Lack of Transportation (Medical): No     Lack of Transportation (Non-Medical): No   Alcohol Use: Not on file   Housing: Low Risk  (04/15/2024)    Housing     Within the past 12 months, have you ever stayed: outside, in a car, in a tent, in an overnight shelter, or temporarily in someone else's home (i.e. couch-surfing)?: No     Are you worried about losing your housing?: No   Physical Activity: Not on file   Utilities: Low Risk  (04/15/2024)    Utilities     Within the past 12 months, have you been unable to get utilities (heat, electricity) when it was really needed?: No   Stress: Not on file   Interpersonal Safety: Not At Risk (04/15/2024)    Interpersonal Safety     Unsafe Where You Currently Live: No     Physically Hurt by Anyone: No     Abused by Anyone: No   Substance Use: Not on file (10/07/2023)   Intimate Partner Violence: Not on file   Social Connections: Not on file   Financial Resource Strain: Medium Risk (11/11/2023)    Overall Financial Resource Strain (CARDIA)     Difficulty of Paying Living Expenses: Somewhat hard   Health Literacy: Not on file   Internet Connectivity: Internet connectivity concern unknown (11/11/2023)    Internet Connectivity     Do you have access to internet services: Yes     How do you connect to the internet: Not on file     Is your internet connection strong enough for you to watch video on your device without major problems?: Not on file     Do you have enough data to get through the month?: Not on file     Does at least one of the devices have a camera that you can use for video chat?: Not on file       Would you be willing to receive help with any of the needs that you have identified today? Not applicable       SHIPPING     Specialty Medication(s) to be Shipped:  Inflammatory Disorders: Bimzelx     Other medication(s) to be shipped: No additional medications requested for fill at this time    Specialty Medications not needed at this time: N/A     Changes to insurance: No    Cost and Payment: Patient has a copay of $4. They are aware and have authorized the pharmacy to charge the credit card on file.    Delivery Scheduled: Yes, Expected medication delivery date: 10/10.     Medication will be delivered via Same Day Courier to the confirmed prescription address in [facility].    The patient will receive a drug information handout for each medication shipped and additional FDA Medication Guides as required.  Verified that patient has previously received a Conservation officer, historic buildings and a Surveyor, mining.    The patient or caregiver noted above participated in the development of this care plan and knows that they can request review of or adjustments to the care plan at any time.      All of the patient's questions and concerns have been addressed.    [facility] Specialty Pharmacist         [1]   Current Outpatient Medications   Medication Sig Dispense Refill    amoxicillin -clavulanate (AUGMENTIN ) 875-125 mg per tablet Take one tablet twice daily 60 tablet 2    bimekizumab -bkzx (BIMZELX  AUTOINJECTOR) 160 mg/mL AtIn Inject the contents of 2 pens (320 mg) under the skin every 4 weeks for maintenance 2 mL 4    calcium carbonate (CALCIUM 500 ORAL) Take 3 tablets by mouth daily. Bariatric Calcium Chews- 3 chews at once      chlorthalidone  (HYGROTON ) 25 MG tablet Take 1 tablet (25 mg total) by mouth daily. 30 tablet 11    clindamycin  (CLEOCIN  T) 1 % Swab Apply 1 Application topically two (2) times a day. 69 each 6    empty container Misc use as directed 1 each 3    multivitamin-min-iron-FA-vit K (BARIATRIC MULTIVITAMINS) 45 mg iron- 800 mcg-120 mcg cap Take 1 tablet by mouth daily.      neomycin-polymyxin-dexamethamethasone (MAXITROL) 3.5 mg/g-10,000 unit/g-0.1 % Oint APPLY A SMALL AMOUNT INTO LEFT EYE ONCE DAILY      omeprazole  (PRILOSEC) 20 MG capsule Take 1 capsule (20 mg total) by mouth daily. 30 capsule 2     No current facility-administered medications for this visit.   [2] No Known Allergies

## 2024-09-05 MED FILL — BIMZELX AUTOINJECTOR 160 MG/ML SUBCUTANEOUS AUTO-INJECTOR: SUBCUTANEOUS | 28 days supply | Qty: 2 | Fill #1

## 2024-09-24 NOTE — Progress Notes (Signed)
 [facility] Special[facility] and Home Delivery Pharmacy Refill Coordination Note    Wendy Chen, Kaser: 1981/04/27  Phone: [phone] (home)       All above HIPAA information was verified with patient.         09/23/2024     7:22 PM   Specialty Rx Medication Refill Questionnaire   Which Medications would you like refilled and shipped? Bimzelx    Please list all current allergies: None   Have you missed any doses in the last 30 days? No   Have you had any changes to your medication(s) since your last refill? No   How much of each medication do you have remaining at home? (eg. number of tablets, injections, etc.) 0   If receiving an injectable medication, next injection date is 09/26/2024   Have you experienced any side effects in the last 30 days? No   Please enter the full address (street address, city, state, zip code) where you would like your medication(s) to be delivered to. [address] , [place]  , [address]   Please specify on which day you would like your medication(s) to arrive. Note: if you need your medication(s) within 3 days, please call the pharmacy to schedule your order at [phone]  09/25/2024   Has your insurance changed since your last refill? No   Would you like a pharmacist to call you to discuss your medication(s)? No   Do you require a signature for your package? (Note: if we are billing Medicare Part B or your order contains a controlled substance, we will require a signature) No   I have been provided my out of pocket cost for my medication and approve the pharmacy to charge the amount to my credit card on file. Yes         Completed refill call assessment today to schedule patient's medication shipment from the [facility] [phone]).  All relevant notes have been reviewed.       Confirmed patient received a Conservation Officer, Historic Buildings and a Surveyor, Mining with first shipment. The patient will receive a drug information handout for each medication shipped and additional FDA Medication Guides as required.         REFERRAL TO PHARMACIST     Referral to the pharmacist: Not needed      [facility]     Shipping address confirmed in Epic.     Delivery Scheduled: Yes, Expected medication delivery date: 09/15/24.     Medication will be delivered via Same Day Courier to the prescription address in Epic [place].    Wendy Chen   [facility] and [facility] Delivery Pharm[facility] Specialty Technician

## 2024-09-25 MED FILL — BIMZELX AUTOINJECTOR 160 MG/ML SUBCUTANEOUS AUTO-INJECTOR: SUBCUTANEOUS | 28 days supply | Qty: 2 | Fill #2

## 2024-10-22 NOTE — Progress Notes (Signed)
 10/22/2024 - Patient originally requested delivery for 10/22/2024. Delivery is not possible on this date due to the pharmacy has reached script capacity for the day. I have reached out to the patient and confirmed that delivery on 10/27/2024 is ok.    [facility] Specialty and Home Delivery Pharmacy Refill Coordination Note    Wendy Chen, [place]: Dec 29, 1980  Phone: [phone] (home)       All above HIPAA information was verified with patient.         10/22/2024    12:06 AM   Specialty Rx Medication Refill Questionnaire   Which Medications would you like refilled and shipped? Bimzelx    Please list all current allergies: No   Have you missed any doses in the last 30 days? No   Have you had any changes to your medication(s) since your last refill? No   How much of each medication do you have remaining at home? (eg. number of tablets, injections, etc.) None   If receiving an injectable medication, next injection date is 10/16/2024   Have you experienced any side effects in the last 30 days? No   Please enter the full address (street address, city, state, zip code) where you would like your medication(s) to be delivered to. [address] , [name] , [id number]   Please specify on which day you would like your medication(s) to arrive. Note: if you need your medication(s) within 3 days, please call the pharmacy to schedule your order at [phone]  10/22/2024   Has your insurance changed since your last refill? No   Would you like a pharmacist to call you to discuss your medication(s)? No   Do you require a signature for your package? (Note: if we are billing Medicare Part B or your order contains a controlled substance, we will require a signature) No   I have been provided my out of pocket cost for my medication and approve the pharmacy to charge the amount to my credit card on file. Yes         Completed refill call assessment today to schedule patient's medication shipment from the [facility] and [facility] Delivery Pharmacy [phone]).  All relevant notes have been reviewed.       Confirmed patient received a Conservation Officer, Historic Buildings and a Surveyor, Mining with first shipment. The patient will receive a drug information handout for each medication shipped and additional FDA Medication Guides as required.         REFERRAL TO PHARMACIST     Referral to the pharmacist: Not needed      [facility]     Shipping address confirmed in Epic.     Delivery Scheduled: Yes, Expected medication delivery date: 10/27/2024.     Medication will be delivered via Same Day Courier to the prescription address in Epic WAM.    Wendy Chen   [facility] Specialty and Home Delivery Pharmacy Specialty Technician

## 2024-10-26 MED FILL — BIMZELX AUTOINJECTOR 160 MG/ML SUBCUTANEOUS AUTO-INJECTOR: SUBCUTANEOUS | 28 days supply | Qty: 2 | Fill #3

## 2024-11-17 NOTE — Progress Notes (Signed)
 11/17/2024 - Patient originally requested delivery for 11/17/24. Delivery is not possible on this date due to floor capacity. I have reached out to the patient and confirmed that delivery on 11/21/24 is ok.  [facility] Special[facility] and Home Delivery Pharmacy Refill Coordination Note    Wendy Chen, Wendy Chen: Jul 30, 1981  Phone: [phone] (home)       All above HIPAA information was verified with patient.         11/15/2024     4:59 PM   Specialty Rx Medication Refill Questionnaire   Which Medications would you like refilled and shipped? Bimzelx    Please list all current allergies: None   Have you missed any doses in the last 30 days? No   Have you had any changes to your medication(s) since your last refill? No   How much of each medication do you have remaining at home? (eg. number of tablets, injections, etc.) 0   If receiving an injectable medication, next injection date is 11/14/2024   Have you experienced any side effects in the last 30 days? No   Please enter the full address (street address, city, state, zip code) where you would like your medication(s) to be delivered to. [address] [place] Wendy Chen Wendy Chen   Please specify on which day you would like your medication(s) to arrive. Note: if you need your medication(s) within 3 days, please call the pharmacy to schedule your order at [phone]  11/17/2024   Has your insurance changed since your last refill? No   Would you like a pharmacist to call you to discuss your medication(s)? No   Do you require a signature for your package? (Note: if we are billing Medicare Part B or your order contains a controlled substance, we will require a signature) No   I have been provided my out of pocket cost for my medication and approve the pharmacy to charge the amount to my credit card on file. Yes         Completed refill call assessment today to schedule patient's medication shipment from the [facility] and Home Delivery Pharmacy [phone]).  All relevant notes have been reviewed.       Confirmed patient received a Conservation Officer, Historic Buildings and a Surveyor, Mining with first shipment. The patient will receive a drug information handout for each medication shipped and additional FDA Medication Guides as required.         REFERRAL TO PHARMACIST     Referral to the pharmacist: Not needed      [facility]     Shipping address confirmed in Epic.     Delivery Scheduled: Yes, Expected medication delivery date: 11/21/24.     Medication will be delivered via same day courier to the prescription address in Epic WAM.    Wendy Chen   [facility] Specialty and Home Delivery Pharmacy Specialty Technician

## 2024-11-21 NOTE — Progress Notes (Signed)
 Wendy Chen 's BIMZELX  AUTOINJECTOR 160 mg/mL Atin (bimekizumab -bkzx) shipment will be delayed as a result of credit card ending in [id number] & [id number] declined     I have reached out to the patient  at [phone] and left a voicemail message.  We will wait for a call back from the patient to reschedule the delivery.  We have not confirmed the new delivery date.

## 2024-11-21 NOTE — Progress Notes (Signed)
 Wendy Chen 's BIMZELX  AUTOINJECTOR 160 mg/mL Atin (bimekizumab -bkzx) shipment will be rescheduled as a result of credit card on file and copay collected.     I have reached out to the patient  at [phone] and communicated the delivery change. We will reschedule the medication for the delivery date that the patient agreed upon.  We have confirmed the delivery date as 11/25/24

## 2024-11-24 MED FILL — BIMZELX AUTOINJECTOR 160 MG/ML SUBCUTANEOUS AUTO-INJECTOR: SUBCUTANEOUS | 28 days supply | Qty: 2 | Fill #4

## 2024-12-18 DIAGNOSIS — L732 Hidradenitis suppurativa: Principal | ICD-10-CM

## 2024-12-18 MED ORDER — BIMZELX AUTOINJECTOR 160 MG/ML SUBCUTANEOUS AUTO-INJECTOR
4 refills | 0.00000 days
Start: 2024-12-18 — End: ?

## 2024-12-19 MED ORDER — BIMZELX AUTOINJECTOR 160 MG/ML SUBCUTANEOUS AUTO-INJECTOR
SUBCUTANEOUS | 4 refills | 0.00000 days | Status: CP
Start: 2024-12-19 — End: ?
  Filled 2025-01-01: qty 2, 28d supply, fill #0

## 2024-12-31 NOTE — Progress Notes (Signed)
 [facility] has made a second and final attempt to reach this patient to refill the following medication:BIMZELX  AUTOINJECTOR 160 mg/mL Atin (bimekizumab -bkzx).      We have left voicemails on the following phone numbers: [phone], have sent a MyChart message, have sent a text message to the following phone numbers: [phone], and have sent a Mychart questionnaire..    Dates contacted: 12/25/24, 12/31/24  Last scheduled delivery: 11/24/25    The patient may be at risk of non-compliance with this medication. The patient should call the [facility] at [phone]  Option 4, then Option 2: Dermatology, Gastroenterology, Rheumatology to refill medication.    Nechama Escutia   [place] [facility] and [facility] Delivery Pharmacy Specialty Technician

## 2025-01-01 NOTE — Progress Notes (Signed)
 [facility] Special[facility] and Home Delivery Pharmacy Refill Coordination Note    Wendy Chen, [place]: [birth year]  Phone: [phone] (home)       All above HIPAA information was verified with patient.         12/31/2024     5:27 PM   Specialty Rx Medication Refill Questionnaire   Which Medications would you like refilled and shipped? Bimzelx    Please list all current allergies: None   Have you missed any doses in the last 30 days? Yes   If Yes, how many doses have you missed ? 2   Have you had any changes to your medication(s) since your last refill? No   How much of each medication do you have remaining at home? (eg. number of tablets, injections, etc.) 0   If receiving an injectable medication, next injection date is 11/21/2024   Have you experienced any side effects in the last 30 days? No   Please enter the full address (street address, city, state, zip code) where you would like your medication(s) to be delivered to. [address] , [place] [address]   Please specify on which day you would like your medication(s) to arrive. Note: if you need your medication(s) within 3 days, please call the pharmacy to schedule your order at [phone]  01/02/2025   Has your insurance changed since your last refill? No   Would you like a pharmacist to call you to discuss your medication(s)? No   Do you require a signature for your package? (Note: if we are billing Medicare Part B or your order contains a controlled substance, we will require a signature) No   I have been provided my out of pocket cost for my medication and approve the pharmacy to charge the amount to my credit card on file. Yes         Completed refill call assessment today to schedule patient's medication shipment from the [facility] and [facility] Delivery Pharmacy [phone]).  All relevant notes have been reviewed.       Confirmed patient received a Conservation Officer, Historic Buildings and a Surveyor, Mining with first shipment. The patient will receive a drug information handout for each medication shipped and additional FDA Medication Guides as required.         REFERRAL TO PHARMACIST     Referral to the pharmacist: Yes - routine compliance concerns. Patient has missed 1-3 doses of medication. Refills were scheduled and concern routed to pharmacist for evaluation.      SHIPPING     Shipping address confirmed in Epic.     Delivery Scheduled: Yes, Expected medication delivery date: 01/02/25.     Medication will be delivered via Next Day Courier to the prescription address in Epic WAM.    Wendy Chen   [facility] Specialty and Home Delivery Pharmacy Specialty Technician
